# Patient Record
Sex: Male | Born: 1990
Health system: Southern US, Community
[De-identification: ages and names within clinical notes are randomized; demographics above are authoritative.]

## PROBLEM LIST (undated history)

## (undated) DIAGNOSIS — T7840XA Allergy, unspecified, initial encounter: Secondary | ICD-10-CM

## (undated) DIAGNOSIS — K219 Gastro-esophageal reflux disease without esophagitis: Secondary | ICD-10-CM

## (undated) DIAGNOSIS — M0820 Juvenile rheumatoid arthritis with systemic onset, unspecified site: Secondary | ICD-10-CM

## (undated) DIAGNOSIS — E785 Hyperlipidemia, unspecified: Secondary | ICD-10-CM

## (undated) HISTORY — DX: Hyperlipidemia, unspecified: E78.5

## (undated) HISTORY — DX: Allergy, unspecified, initial encounter: T78.40XA

## (undated) HISTORY — DX: Gastro-esophageal reflux disease without esophagitis: K21.9

---

## 2015-05-15 ENCOUNTER — Emergency Department (HOSPITAL_COMMUNITY): Payer: No Typology Code available for payment source

## 2015-05-15 ENCOUNTER — Encounter (HOSPITAL_COMMUNITY): Payer: Self-pay | Admitting: Cardiology

## 2015-05-15 ENCOUNTER — Emergency Department (HOSPITAL_COMMUNITY)
Admission: EM | Admit: 2015-05-15 | Discharge: 2015-05-15 | Disposition: A | Discharge status: Home / Self Care | Payer: No Typology Code available for payment source | Attending: Emergency Medicine | Admitting: Emergency Medicine

## 2015-05-15 DIAGNOSIS — S39012A Strain of muscle, fascia and tendon of lower back, initial encounter: Secondary | ICD-10-CM | POA: Diagnosis not present

## 2015-05-15 DIAGNOSIS — Y92410 Unspecified street and highway as the place of occurrence of the external cause: Secondary | ICD-10-CM | POA: Diagnosis not present

## 2015-05-15 DIAGNOSIS — S161XXA Strain of muscle, fascia and tendon at neck level, initial encounter: Secondary | ICD-10-CM | POA: Diagnosis not present

## 2015-05-15 DIAGNOSIS — Y998 Other external cause status: Secondary | ICD-10-CM | POA: Diagnosis not present

## 2015-05-15 DIAGNOSIS — Y9389 Activity, other specified: Secondary | ICD-10-CM | POA: Diagnosis not present

## 2015-05-15 DIAGNOSIS — S199XXA Unspecified injury of neck, initial encounter: Secondary | ICD-10-CM | POA: Diagnosis present

## 2015-05-15 DIAGNOSIS — Z720 Tobacco use: Secondary | ICD-10-CM | POA: Diagnosis not present
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
## 2015-05-15 MED ORDER — IBUPROFEN 800 MG PO TABS: 800.0000 mg | ORAL_TABLET | Freq: Three times a day (TID) | ORAL | Status: DC | PRN

## 2015-05-15 MED ORDER — HYDROCODONE-ACETAMINOPHEN 5-325 MG PO TABS: 1.0000 | ORAL_TABLET | Freq: Four times a day (QID) | ORAL | Status: DC | PRN
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
## 2015-05-15 NOTE — Discharge Instructions (Signed)
Return here as needed. Follow up with a primary doctor. °

## 2015-05-15 NOTE — ED Notes (Signed)
Pt ambulated in the hallway without any problems.

## 2015-10-10 ENCOUNTER — Encounter: Payer: Self-pay | Admitting: Family Medicine

## 2015-10-10 ENCOUNTER — Ambulatory Visit (INDEPENDENT_AMBULATORY_CARE_PROVIDER_SITE_OTHER): Payer: BLUE CROSS/BLUE SHIELD | Admitting: Family Medicine

## 2015-10-10 VITALS — BP 111/67 | HR 70 | Ht 71.26 in | Wt 241.0 lb

## 2015-10-10 DIAGNOSIS — L74513 Primary focal hyperhidrosis, soles: Secondary | ICD-10-CM | POA: Diagnosis not present

## 2015-10-10 DIAGNOSIS — Z23 Encounter for immunization: Secondary | ICD-10-CM

## 2015-10-10 MED ORDER — ALUMINUM CHLORIDE 20 % EX SOLN: Freq: Every day | CUTANEOUS | Status: DC

## 2015-10-10 MED ORDER — TETANUS-DIPHTH-ACELL PERTUSSIS 5-2.5-18.5 LF-MCG/0.5 IM SUSP
0.5000 mL | Freq: Once | INTRAMUSCULAR | Status: AC
  Administered 2015-10-10: 0.5 mL via INTRAMUSCULAR

## 2015-10-10 NOTE — Addendum Note (Signed)
Addended by: Thom Chimes on: 10/10/2015 01:54 PM   Modules accepted: Orders

## 2015-10-10 NOTE — Progress Notes (Signed)
CC: Raymond Owens is a 25 y.o. male is here for Establish Care and Immunizations   Subjective: HPI:  Very pleasant 25 year old here to establish care  His wife is expecting to deliver their first child the spring, it will be a girl. He wants to know if there is immunizations that he should have prior to the delivery of the child. He believes he is up-to-date on all of his childhood vaccinations but as an adult has not received any vaccinations except for tetanus booster at some point within the last few years. He cannot recall if it had the pertussis vaccine in it or not.   He also has been dealing with sweaty feetFor matter of years. It's occurring on a daily basis. It's worse when he wears boots for work. Symptoms are moderate to severe in severity and result in malodorous socks. His wife has actually restricted him from putting his socks in the general laundry given how bad the smell. He's tried antiperspirants and baby powder but nothing seems to help. He denies itching, redness or pain on either of the feet. He denies excessive sweating elsewhere.  Review of Systems - General ROS: negative for - chills, fever, night sweats, weight gain or weight loss Ophthalmic ROS: negative for - decreased vision Psychological ROS: negative for - anxiety or depression ENT ROS: negative for - hearing change, nasal congestion, tinnitus or allergies Hematological and Lymphatic ROS: negative for - bleeding problems, bruising or swollen lymph nodes Breast ROS: negative Respiratory ROS: no cough, shortness of breath, or wheezing Cardiovascular ROS: no chest pain or dyspnea on exertion Gastrointestinal ROS: no abdominal pain, change in bowel habits, or black or bloody stools Genito-Urinary ROS: negative for - genital discharge, genital ulcers, incontinence or abnormal bleeding from genitals Musculoskeletal ROS: negative for - joint pain or muscle pain Neurological ROS: negative for - headaches or memory  loss Dermatological ROS: negative for lumps, mole changes, rash and skin lesion changes  Past Medical History  Diagnosis Date  . Hyperlipidemia     History reviewed. No pertinent past surgical history. Family History  Problem Relation Age of Onset  . Diabetes Mother   . Heart attack Maternal Uncle   . Cancer Maternal Grandmother   . Cancer Maternal Grandfather   . Cancer Paternal Grandmother   . Cancer Paternal Grandfather     Social History   Social History  . Marital Status: Single    Spouse Name: N/A  . Number of Children: N/A  . Years of Education: N/A   Occupational History  . Not on file.   Social History Main Topics  . Smoking status: Former Smoker    Quit date: 10/10/2011  . Smokeless tobacco: Not on file  . Alcohol Use: Yes  . Drug Use: No  . Sexual Activity: Yes    Birth Control/ Protection: None   Other Topics Concern  . Not on file   Social History Narrative     Objective: BP 111/67 mmHg  Pulse 70  Ht 5' 11.26" (1.81 m)  Wt 241 lb (109.317 kg)  BMI 33.37 kg/m2  General: Alert and Oriented, No Acute Distress HEENT: Pupils equal, round, reactive to light. Conjunctivae clear.  External ears unremarkable, canals clear with intact TMs with appropriate landmarks.  Middle ear appears open without effusion. Pink inferior turbinates.  Moist mucous membranes, pharynx without inflammation nor lesions.  Neck supple without palpable lymphadenopathy nor abnormal masses. Lungs: Clear to auscultation bilaterally, no wheezing/ronchi/rales.  Comfortable work of breathing. Good  air movement. Cardiac: Regular rate and rhythm. Normal S1/S2.  No murmurs, rubs, nor gallops.   Extremities: No peripheral edema.  Strong peripheral pulses.  Mental Status: No depression, anxiety, nor agitation. Skin: Warm and dry.  Assessment & Plan: [name] was seen today for establish care and immunizations.  Diagnoses and all orders for this visit:  Sweaty feet -     aluminum  chloride (DRYSOL) 20 % external solution; Apply topically at bedtime. Apply to feet three consecutive nights then only weekly thereafter.   Discussed that he'll need to get the pertussis vaccine today and that this is packaged in the Tdap vaccine. Additionally be wise to get a flu shot today. I recommended that he also start using Drysol to help with reducing sweat production in both feet.  Return if symptoms worsen or fail to improve.

## 2015-10-26 ENCOUNTER — Encounter: Payer: Self-pay | Admitting: Family Medicine

## 2015-10-26 MED ORDER — OSELTAMIVIR PHOSPHATE 75 MG PO CAPS: 75.0000 mg | ORAL_CAPSULE | Freq: Two times a day (BID) | ORAL | Status: DC

## 2015-10-26 MED ORDER — ONDANSETRON HCL 4 MG PO TABS: 4.0000 mg | ORAL_TABLET | Freq: Three times a day (TID) | ORAL | Status: DC | PRN

## 2016-06-27 ENCOUNTER — Ambulatory Visit (INDEPENDENT_AMBULATORY_CARE_PROVIDER_SITE_OTHER): Payer: BLUE CROSS/BLUE SHIELD | Admitting: Physician Assistant

## 2016-06-27 ENCOUNTER — Encounter: Payer: Self-pay | Admitting: Physician Assistant

## 2016-06-27 VITALS — BP 131/47 | HR 59 | Ht 71.5 in | Wt 243.0 lb

## 2016-06-27 DIAGNOSIS — B359 Dermatophytosis, unspecified: Secondary | ICD-10-CM

## 2016-06-27 DIAGNOSIS — Z0000 Encounter for general adult medical examination without abnormal findings: Secondary | ICD-10-CM | POA: Diagnosis not present

## 2016-06-27 DIAGNOSIS — Z131 Encounter for screening for diabetes mellitus: Secondary | ICD-10-CM | POA: Diagnosis not present

## 2016-06-27 DIAGNOSIS — Z23 Encounter for immunization: Secondary | ICD-10-CM | POA: Diagnosis not present

## 2016-06-27 DIAGNOSIS — Z13220 Encounter for screening for lipoid disorders: Secondary | ICD-10-CM | POA: Diagnosis not present

## 2016-06-27 LAB — COMPLETE METABOLIC PANEL WITH GFR
ALT: 45 U/L (ref 9–46)
AST: 29 U/L (ref 10–40)
Albumin: 4.5 g/dL (ref 3.6–5.1)
Alkaline Phosphatase: 74 U/L (ref 40–115)
BUN: 11 mg/dL (ref 7–25)
CHLORIDE: 104 mmol/L (ref 98–110)
CO2: 25 mmol/L (ref 20–31)
CREATININE: 0.92 mg/dL (ref 0.60–1.35)
Calcium: 9.5 mg/dL (ref 8.6–10.3)
GLUCOSE: 88 mg/dL (ref 65–99)
Potassium: 4.2 mmol/L (ref 3.5–5.3)
SODIUM: 139 mmol/L (ref 135–146)
TOTAL PROTEIN: 7.6 g/dL (ref 6.1–8.1)
Total Bilirubin: 0.6 mg/dL (ref 0.2–1.2)

## 2016-06-27 LAB — LIPID PANEL
Cholesterol: 152 mg/dL (ref 125–200)
HDL: 28 mg/dL — ABNORMAL LOW (ref >=40)
LDL CALC: 72 mg/dL (ref <130)
TRIGLYCERIDES: 260 mg/dL — AB (ref <150)
Total CHOL/HDL Ratio: 5.4 Ratio — ABNORMAL HIGH (ref <=5.0)
VLDL: 52 mg/dL — AB (ref <30)

## 2016-06-27 MED ORDER — CLOTRIMAZOLE 1 % EX CREA: 1.0000 "application " | TOPICAL_CREAM | Freq: Two times a day (BID) | CUTANEOUS | 1 refills | Status: DC

## 2016-06-27 MED ORDER — MELOXICAM 15 MG PO TABS: 15.0000 mg | ORAL_TABLET | Freq: Every day | ORAL | 1 refills | Status: DC

## 2016-06-27 NOTE — Patient Instructions (Addendum)
Body Ringworm Ringworm (tinea corporis) is a fungal infection of the skin on the body. This infection is not caused by worms, but is actually caused by a fungus. Fungus normally lives on the top of your skin and can be useful. However, in the case of ringworms, the fungus grows out of control and causes a skin infection. It can involve any area of skin on the body and can spread easily from one person to another (contagious). Ringworm is a common problem for children, but it can affect adults as well. Ringworm is also often found in athletes, especially wrestlers who share equipment and mats.  CAUSES  Ringworm of the body is caused by a fungus called dermatophyte. It can spread by:  Touchingother people who are infected.  Touchinginfected pets.  Touching or sharingobjects that have been in contact with the infected person or pet (hats, combs, towels, clothing, sports equipment). SYMPTOMS   Itchy, raised red spots and bumps on the skin.  Ring-shaped rash.  Redness near the border of the rash with a clear center.  Dry and scaly skin on or around the rash. Not every person develops a ring-shaped rash. Some develop only the red, scaly patches. DIAGNOSIS  Most often, ringworm can be diagnosed by performing a skin exam. Your caregiver may choose to take a skin scraping from the affected area. The sample will be examined under the microscope to see if the fungus is present.  TREATMENT  Body ringworm may be treated with a topical antifungal cream or ointment. Sometimes, an antifungal shampoo that can be used on your body is prescribed. You may be prescribed antifungal medicines to take by mouth if your ringworm is severe, keeps coming back, or lasts a long time.  HOME CARE INSTRUCTIONS   Only take over-the-counter or prescription medicines as directed by your caregiver.  Wash the infected area and dry it completely before applying yourcream or ointment.  When using antifungal shampoo to  treat the ringworm, leave the shampoo on the body for 3-5 minutes before rinsing.   Wear loose clothing to stop clothes from rubbing and irritating the rash.  Wash or change your bed sheets every night while you have the rash.  Have your pet treated by your veterinarian if it has the same infection. To prevent ringworm:   Practice good hygiene.  Wear sandals or shoes in public places and showers.  Do not share personal items with others.  Avoid touching red patches of skin on other people.  Avoid touching pets that have bald spots or wash your hands after doing so. SEEK MEDICAL CARE IF:   Your rash continues to spread after 7 days of treatment.  Your rash is not gone in 4 weeks.  The area around your rash becomes red, warm, tender, and swollen.   This information is not intended to replace advice given to you by your health care provider. Make sure you discuss any questions you have with your health care provider.   Document Released: 08/08/2000 Document Revised: 05/05/2012 Document Reviewed: 02/23/2012 Elsevier Interactive Patient Education 2016 [employer] Keeping you healthy  Get these tests  Blood pressure- Have your blood pressure checked once a year by your healthcare provider.  Normal blood pressure is 120/80.  Weight- Have your body mass index (BMI) calculated to screen for obesity.  BMI is a measure of body fat based on height and weight. You can also calculate your own BMI at https://www.west-esparza.com/www.nhlbisupport.com/bmi/.  Cholesterol- Have your cholesterol checked regularly starting  at age 24, sooner may be necessary if you have diabetes, high blood pressure, if a family member developed heart diseases at an early age or if you smoke.   Chlamydia, HIV, and other sexual transmitted disease- Get screened each year until the age of 24 then within three months of each new sexual partner.  Diabetes- Have your blood sugar checked regularly if you have high blood pressure, high  cholesterol, a family history of diabetes or if you are overweight.  Get these vaccines  Flu shot- Every fall.  Tetanus shot- Every 10 years.  Menactra- Single dose; prevents meningitis.  Take these steps  Don't smoke- If you do smoke, ask your healthcare provider about quitting. For tips on how to quit, go to www.smokefree.gov or call 1-800-QUIT-NOW.  Be physically active- Exercise 5 days a week for at least 30 minutes.  If you are not already physically active start slow and gradually work up to 30 minutes of moderate physical activity.  Examples of moderate activity include walking briskly, mowing the yard, dancing, swimming bicycling, etc.  Eat a healthy diet- Eat a variety of healthy foods such as fruits, vegetables, low fat milk, low fat cheese, yogurt, lean meats, poultry, fish, beans, tofu, etc.  For more information on healthy eating, go to www.thenutritionsource.org  Drink alcohol in moderation- Limit alcohol intake two drinks or less a day.  Never drink and drive.  Dentist- Brush and floss teeth twice daily; visit your dentis twice a year.  Depression-Your emotional health is as important as your physical health.  If you're feeling down, losing interest in things you normally enjoy please talk with your healthcare provider.  Gun Safety- If you keep a gun in your home, keep it unloaded and with the safety lock on.  Bullets should be stored separately.  Helmet use- Always wear a helmet when riding a motorcycle, bicycle, rollerblading or skateboarding.  Safe sex- If you may be exposed to a sexually transmitted infection, use a condom  Seat belts- Seat bels can save your life; always wear one.  Smoke/Carbon Monoxide detectors- These detectors need to be installed on the appropriate level of your home.  Replace batteries at least once a year.  Skin Cancer- When out in the sun, cover up and use sunscreen SPF 15 or higher.  Violence- If anyone is threatening or hurting you,  please tell your healthcare provider.

## 2016-06-29 DIAGNOSIS — B359 Dermatophytosis, unspecified: Secondary | ICD-10-CM | POA: Insufficient documentation

## 2016-06-29 NOTE — Progress Notes (Signed)
Subjective:    Patient ID: Raymond JacobsenSteven Owens, male    DOB: May 23, 1991, 25 y.o.   MRN: [id number]  HPI Pt is a 25 yo male who presents to the clinic for CPE. He does have a itchy rash on right elbow. Noticed for a few weeks. Not tried anything to make better.   .. Active Ambulatory Problems    Diagnosis Date Noted  . Ringworm 06/29/2016   Resolved Ambulatory Problems    Diagnosis Date Noted  . No Resolved Ambulatory Problems   Past Medical History:  Diagnosis Date  . Hyperlipidemia    . Family History  Problem Relation Age of Onset  . Diabetes Mother   . Heart attack Maternal Uncle   . Cancer Maternal Grandmother   . Cancer Maternal Grandfather   . Cancer Paternal Grandmother   . Cancer Paternal Grandfather    .[name]. Social History   Social History  . Marital status: Single    Spouse name: N/A  . Number of children: N/A  . Years of education: N/A   Occupational History  . Not on file.   Social History Main Topics  . Smoking status: Former Smoker    Quit date: 10/10/2011  . Smokeless tobacco: Not on file  . Alcohol use Yes  . Drug use: No  . Sexual activity: Yes    Birth control/ protection: None   Other Topics Concern  . Not on file   Social History Narrative  . No narrative on file      Review of Systems  All other systems reviewed and are negative.      Objective:   Physical Exam BP (!) 131/47   Pulse (!) 59   Ht 5' 11.5" (1.816 m)   Wt 243 lb (110.2 kg)   BMI 33.42 kg/m   General Appearance:    Alert, cooperative, no distress, appears stated age  Head:    Normocephalic, without obvious abnormality, atraumatic  Eyes:    PERRL, conjunctiva/corneas clear, EOM's intact, fundi    benign, both eyes       Ears:    Normal TM's and external ear canals, both ears  Nose:   Nares normal, septum midline, mucosa normal, no drainage    or sinus tenderness  Throat:   Lips, mucosa, and tongue normal; teeth and gums normal  Neck:   Supple, symmetrical, trachea  midline, no adenopathy;       thyroid:  No enlargement/tenderness/nodules; no carotid   bruit or JVD  Back:     Symmetric, no curvature, ROM normal, no CVA tenderness  Lungs:     Clear to auscultation bilaterally, respirations unlabored  Chest wall:    No tenderness or deformity  Heart:    Regular rate and rhythm, S1 and S2 normal, no murmur, rub   or gallop  Abdomen:     Soft, non-tender, bowel sounds active all four quadrants,    no masses, no organomegaly        Extremities:   Extremities normal, atraumatic, no cyanosis or edema  Pulses:   2+ and symmetric all extremities  Skin:   Circular rash with central clearing and raised erythematous borders on right elbow. Skin color, texture, turgor normal, no lesions  Lymph nodes:   Cervical, supraclavicular, and axillary nodes normal  Neurologic:   CNII-XII intact. Normal strength, sensation and reflexes      throughout         Assessment & Plan:  [name].[name].Viviann SpareSteven was seen today for annual  exam.  Diagnoses and all orders for this visit:  Routine physical examination  Influenza vaccine needed -     Flu Vaccine QUAD 36+ mos PF IM (Fluarix & Fluzone Quad PF)  Screening for lipid disorders -     Lipid panel  Screening for diabetes mellitus -     COMPLETE METABOLIC PANEL WITH GFR  Ringworm -     clotrimazole (LOTRIMIN) 1 % cream; Apply 1 application topically 2 (two) times daily.  Other orders -     meloxicam (MOBIC) 15 MG tablet; Take 1 tablet (15 mg total) by mouth daily.

## 2016-06-30 ENCOUNTER — Encounter: Payer: Self-pay | Admitting: Physician Assistant

## 2016-06-30 DIAGNOSIS — E781 Pure hyperglyceridemia: Secondary | ICD-10-CM | POA: Insufficient documentation

## 2016-08-27 DIAGNOSIS — H52223 Regular astigmatism, bilateral: Secondary | ICD-10-CM | POA: Diagnosis not present

## 2017-05-19 ENCOUNTER — Encounter: Payer: Self-pay | Admitting: Physician Assistant

## 2017-05-19 ENCOUNTER — Ambulatory Visit (INDEPENDENT_AMBULATORY_CARE_PROVIDER_SITE_OTHER): Payer: 59 | Admitting: Physician Assistant

## 2017-05-19 VITALS — BP 136/59 | HR 63 | Ht 71.5 in | Wt 225.0 lb

## 2017-05-19 DIAGNOSIS — E781 Pure hyperglyceridemia: Secondary | ICD-10-CM | POA: Diagnosis not present

## 2017-05-19 DIAGNOSIS — Z13220 Encounter for screening for lipoid disorders: Secondary | ICD-10-CM | POA: Diagnosis not present

## 2017-05-19 DIAGNOSIS — Z0000 Encounter for general adult medical examination without abnormal findings: Secondary | ICD-10-CM | POA: Diagnosis not present

## 2017-05-19 DIAGNOSIS — Z131 Encounter for screening for diabetes mellitus: Secondary | ICD-10-CM | POA: Diagnosis not present

## 2017-05-19 DIAGNOSIS — Z23 Encounter for immunization: Secondary | ICD-10-CM | POA: Diagnosis not present

## 2017-05-19 LAB — LIPID PANEL W/REFLEX DIRECT LDL
CHOL/HDL RATIO: 4.6 (calc) (ref <5.0)
CHOLESTEROL: 155 mg/dL (ref <200)
HDL: 34 mg/dL — AB (ref >40)
LDL CHOLESTEROL (CALC): 99 mg/dL
Non-HDL Cholesterol (Calc): 121 mg/dL (calc) (ref <130)
TRIGLYCERIDES: 127 mg/dL (ref <150)

## 2017-05-19 LAB — COMPLETE METABOLIC PANEL WITH GFR
AG RATIO: 1.8 (calc) (ref 1.0–2.5)
ALBUMIN MSPROF: 4.6 g/dL (ref 3.6–5.1)
ALT: 34 U/L (ref 9–46)
AST: 22 U/L (ref 10–40)
Alkaline phosphatase (APISO): 65 U/L (ref 40–115)
BILIRUBIN TOTAL: 0.7 mg/dL (ref 0.2–1.2)
BUN: 14 mg/dL (ref 7–25)
CALCIUM: 9.6 mg/dL (ref 8.6–10.3)
CHLORIDE: 102 mmol/L (ref 98–110)
CO2: 26 mmol/L (ref 20–32)
Creat: 0.93 mg/dL (ref 0.60–1.35)
GFR, EST NON AFRICAN AMERICAN: 114 mL/min/{1.73_m2} (ref >=60)
GFR, Est African American: 132 mL/min/{1.73_m2} (ref >=60)
Globulin: 2.6 g/dL (calc) (ref 1.9–3.7)
Glucose, Bld: 91 mg/dL (ref 65–99)
POTASSIUM: 3.8 mmol/L (ref 3.5–5.3)
Sodium: 139 mmol/L (ref 135–146)
Total Protein: 7.2 g/dL (ref 6.1–8.1)

## 2017-05-19 NOTE — Patient Instructions (Signed)

## 2017-05-19 NOTE — Progress Notes (Signed)
Subjective:    Patient ID: Raymond Owens, male    DOB: 06/14/1991, 26 y.o.   MRN: [id number]  HPI  Pt is a 26 yo male who presents today for CPE.   He is running for at least twice a week. He is making better food options and down from 243 to 225.   .. Active Ambulatory Problems    Diagnosis Date Noted  . Ringworm 06/29/2016  . Hypertriglyceridemia 06/30/2016   Resolved Ambulatory Problems    Diagnosis Date Noted  . No Resolved Ambulatory Problems   Past Medical History:  Diagnosis Date  . Hyperlipidemia    .[name] Family History  Problem Relation Age of Onset  . Diabetes Mother   . Heart attack Maternal Uncle   . Cancer Maternal Grandmother   . Cancer Maternal Grandfather   . Cancer Paternal Grandmother   . Cancer Paternal Grandfather    .[name] Social History   Social History  . Marital status: Single    Spouse name: N/A  . Number of children: N/A  . Years of education: N/A   Occupational History  . Not on file.   Social History Main Topics  . Smoking status: Former Smoker    Quit date: 10/10/2011  . Smokeless tobacco: Never Used  . Alcohol use Yes  . Drug use: No  . Sexual activity: Yes    Birth control/ protection: None   Other Topics Concern  . Not on file   Social History Narrative  . No narrative on file      Review of Systems  All other systems reviewed and are negative.      Objective:   Physical Exam BP (!) 136/59   Pulse 63   Ht 5' 11.5" (1.816 m)   Wt 225 lb (102.1 kg)   BMI 30.94 kg/m   General Appearance:    Alert, cooperative, no distress, appears stated age  Head:    Normocephalic, without obvious abnormality, atraumatic  Eyes:    PERRL, conjunctiva/corneas clear, EOM's intact, fundi    benign, both eyes       Ears:    Normal TM's and external ear canals, both ears  Nose:   Nares normal, septum midline, mucosa normal, no drainage    or sinus tenderness  Throat:   Lips, mucosa, and tongue normal; teeth and gums normal   Neck:   Supple, symmetrical, trachea midline, no adenopathy;       thyroid:  No enlargement/tenderness/nodules; no carotid   bruit or JVD  Back:     Symmetric, no curvature, ROM normal, no CVA tenderness  Lungs:     Clear to auscultation bilaterally, respirations unlabored  Chest wall:    No tenderness or deformity  Heart:    Regular rate and rhythm, S1 and S2 normal, no murmur, rub   or gallop  Abdomen:     Soft, non-tender, bowel sounds active all four quadrants,    no masses, no organomegaly  Genitalia:  NOt done.   Rectal:  Not done.   Extremities:   Extremities normal, atraumatic, no cyanosis or edema  Pulses:   2+ and symmetric all extremities  Skin:   Skin color, texture, turgor normal, no rashes or lesions  Lymph nodes:   Cervical, supraclavicular, and axillary nodes normal  Neurologic:   CNII-XII intact. Normal strength, sensation and reflexes      throughout         Assessment & Plan:   [name] KitchenMarland KitchenGregorio was seen today  for annual exam.  Diagnoses and all orders for this visit:  Routine physical examination -     Lipid Panel w/reflex Direct LDL -     COMPLETE METABOLIC PANEL WITH GFR  Screening for lipid disorders -     Lipid Panel w/reflex Direct LDL  Screening for diabetes mellitus -     COMPLETE METABOLIC PANEL WITH GFR  Influenza vaccine needed -     Flu Vaccine QUAD 6+ mos PF IM (Fluarix Quad PF)  Hypertriglyceridemia    .[name] Depression screen [facility] 2/9 05/19/2017  Decreased Interest 0  Down, Depressed, Hopeless 0  PHQ - 2 Score 0   Labs ordered today.   [name].Start a regular exercise program and make sure you are eating a healthy diet Try to eat 4 servings of dairy a day or take a calcium supplement (  twice a day). Your vaccines are up to date.   Continue to work on weight loss with exercise and diet control.   Flu shot given today.

## 2017-12-22 MED FILL — CHLORHEXIDINE 0.12% RINSE: 0.12 | 16 days supply | Qty: 473 | Fill #0

## 2018-05-20 ENCOUNTER — Ambulatory Visit (INDEPENDENT_AMBULATORY_CARE_PROVIDER_SITE_OTHER): Payer: 59

## 2018-05-20 ENCOUNTER — Ambulatory Visit (INDEPENDENT_AMBULATORY_CARE_PROVIDER_SITE_OTHER): Payer: 59 | Admitting: Physician Assistant

## 2018-05-20 ENCOUNTER — Encounter: Payer: Self-pay | Admitting: Physician Assistant

## 2018-05-20 VITALS — BP 138/80 | HR 80 | Ht 71.5 in | Wt 228.0 lb

## 2018-05-20 DIAGNOSIS — Z0000 Encounter for general adult medical examination without abnormal findings: Secondary | ICD-10-CM

## 2018-05-20 DIAGNOSIS — G8929 Other chronic pain: Secondary | ICD-10-CM | POA: Diagnosis not present

## 2018-05-20 DIAGNOSIS — M79672 Pain in left foot: Secondary | ICD-10-CM | POA: Diagnosis not present

## 2018-05-20 DIAGNOSIS — Z23 Encounter for immunization: Secondary | ICD-10-CM | POA: Diagnosis not present

## 2018-05-20 IMAGING — DX DG FOOT COMPLETE 3+V*L*
3 series · 3 of 3 positions shown · non-contrast
Comparison: None.

CLINICAL DATA: Chronic left heel pain for the past year, worse with
increased running.

EXAM:
LEFT FOOT - COMPLETE 3+ VIEW

[foot ap]
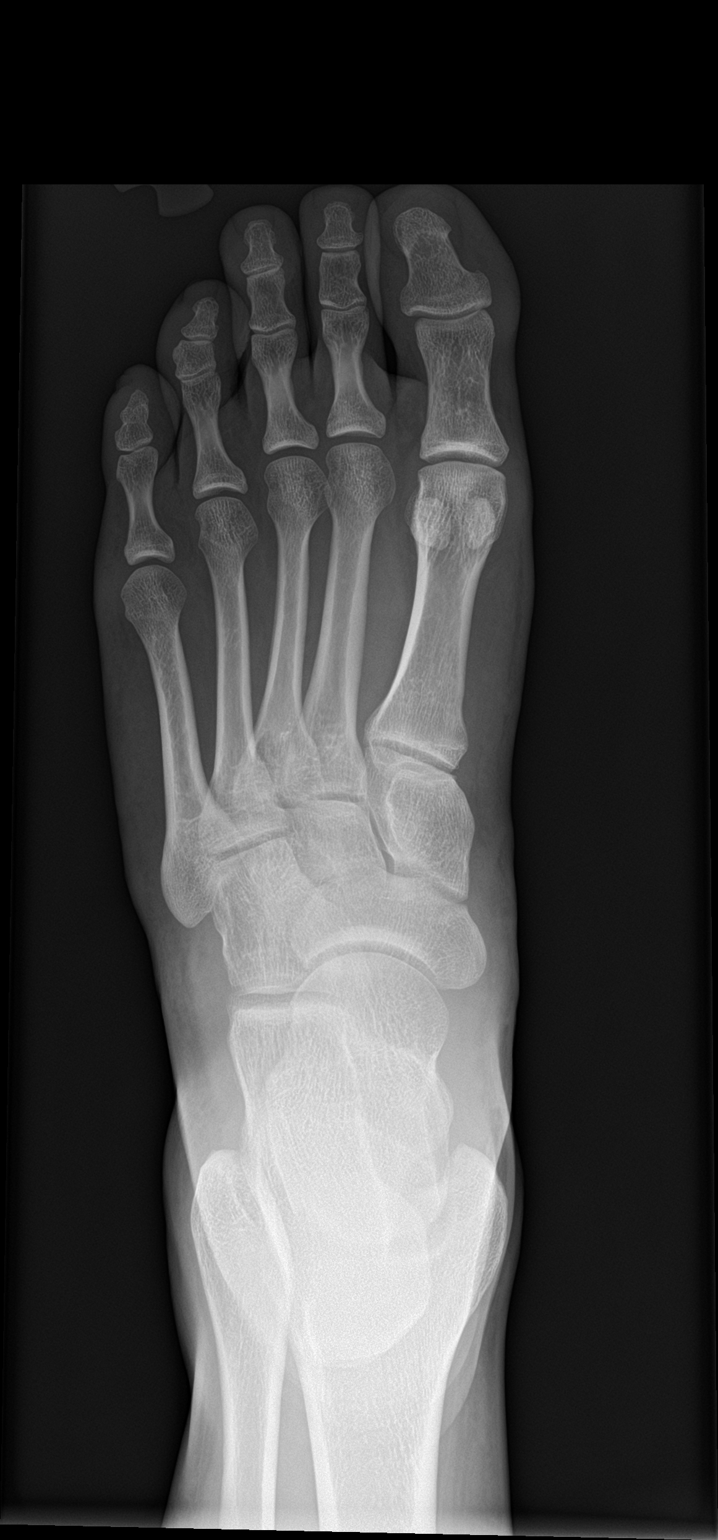

[foot obl]
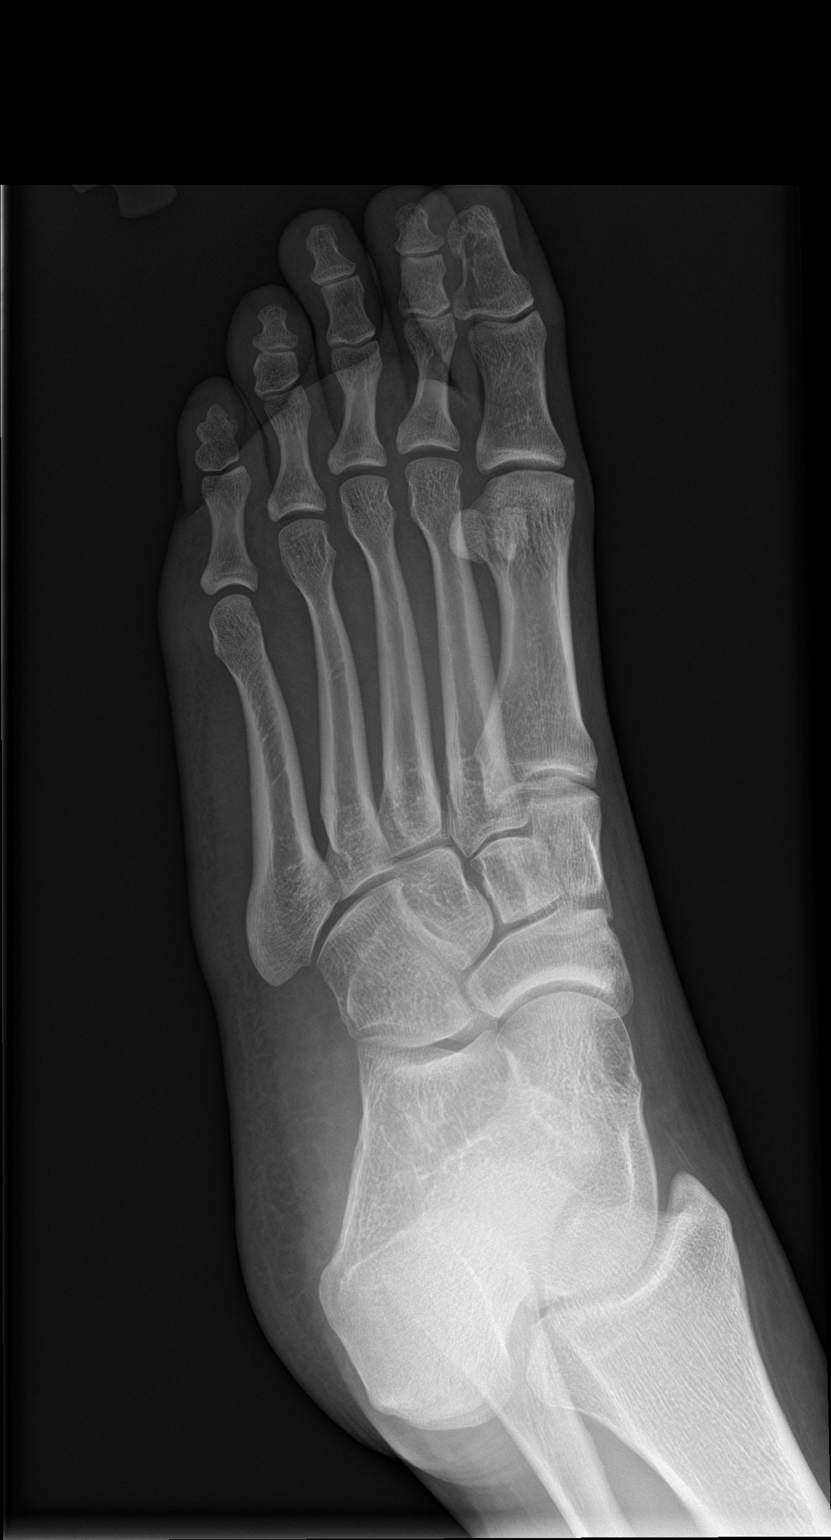

[foot lat]
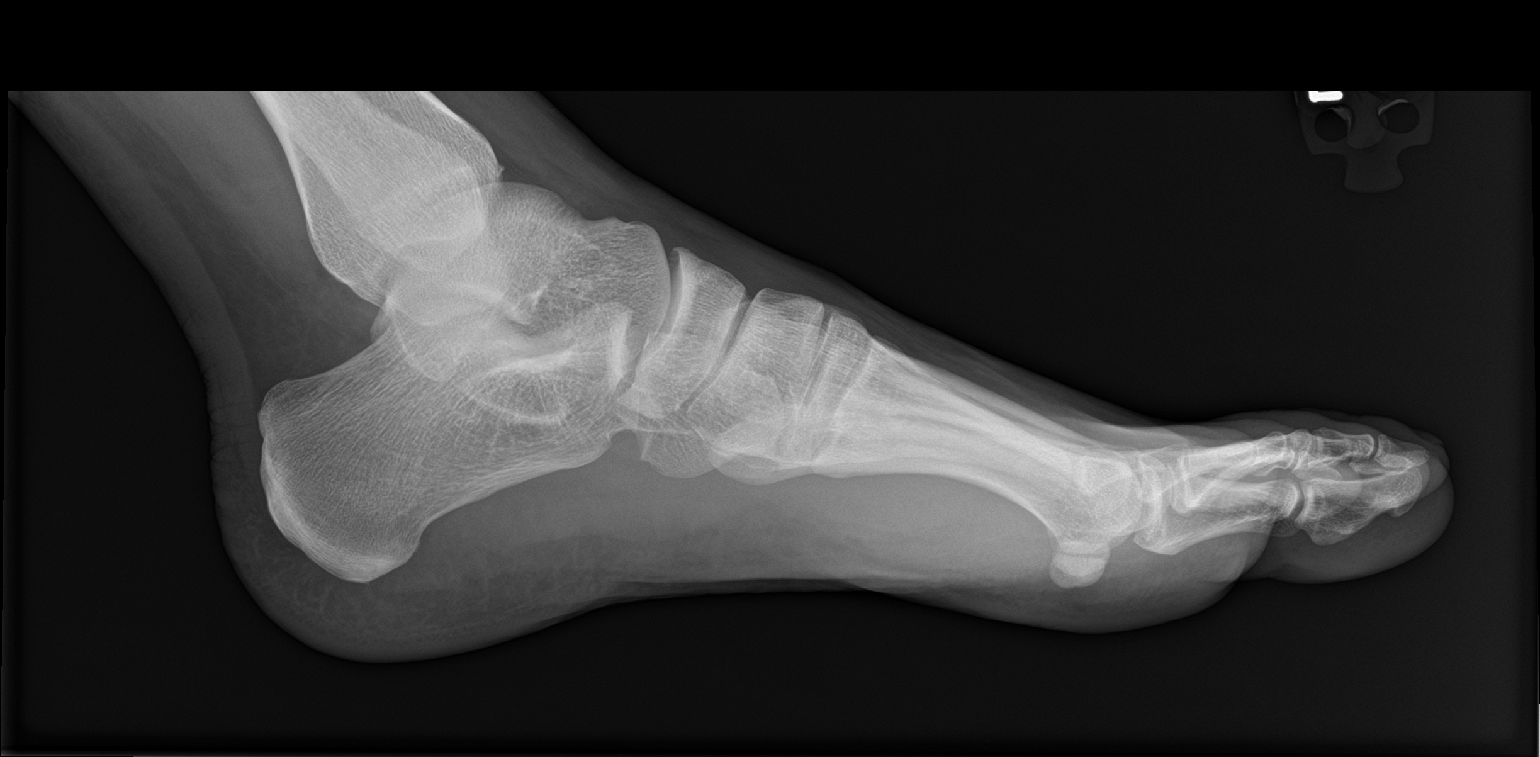

[3 of 3 positions shown; findings below may reference images not displayed]

FINDINGS: There is no evidence of fracture or dislocation. There is no
evidence of arthropathy or other focal bone abnormality. Soft
tissues are unremarkable.
IMPRESSION: Normal examination.

## 2018-05-20 MED ORDER — MELOXICAM 15 MG PO TABS: 15.0000 mg | ORAL_TABLET | Freq: Every day | ORAL | 1 refills | Status: DC

## 2018-05-20 NOTE — Patient Instructions (Addendum)
Consider podiatry referral, sports medicine for orthotics/injections.  Will give mobic as needed.   Health Maintenance, Male A healthy lifestyle and preventive care is important for your health and wellness. Ask your health care provider about what schedule of regular examinations is right for you. What should I know about weight and diet? Eat a Healthy Diet  Eat plenty of vegetables, fruits, whole grains, low-fat dairy products, and lean protein.  Do not eat a lot of foods high in solid fats, added sugars, or salt.  Maintain a Healthy Weight Regular exercise can help you achieve or maintain a healthy weight. You should:  Do at least 150 minutes of exercise each week. The exercise should increase your heart rate and make you sweat (moderate-intensity exercise).  Do strength-training exercises at least twice a week.  Watch Your Levels of Cholesterol and Blood Lipids  Have your blood tested for lipids and cholesterol every 5 years starting at 26 years of age. If you are at high risk for heart disease, you should start having your blood tested when you are 26 years old. You may need to have your cholesterol levels checked more often if: ? Your lipid or cholesterol levels are high. ? You are older than 26 years of age. ? You are at high risk for heart disease.  What should I know about cancer screening? Many types of cancers can be detected early and may often be prevented. Lung Cancer  You should be screened every year for lung cancer if: ? You are a current smoker who has smoked for at least 30 years. ? You are a former smoker who has quit within the past 15 years.  Talk to your health care provider about your screening options, when you should start screening, and how often you should be screened.  Colorectal Cancer  Routine colorectal cancer screening usually begins at 27 years of age and should be repeated every 5-10 years until you are 26 years old. You may need to be screened  more often if early forms of precancerous polyps or small growths are found. Your health care provider may recommend screening at an earlier age if you have risk factors for colon cancer.  Your health care provider may recommend using home test kits to check for hidden blood in the stool.  A small camera at the end of a tube can be used to examine your colon (sigmoidoscopy or colonoscopy). This checks for the earliest forms of colorectal cancer.  Prostate and Testicular Cancer  Depending on your age and overall health, your health care provider may do certain tests to screen for prostate and testicular cancer.  Talk to your health care provider about any symptoms or concerns you have about testicular or prostate cancer.  Skin Cancer  Check your skin from head to toe regularly.  Tell your health care provider about any new moles or changes in moles, especially if: ? There is a change in a mole's size, shape, or color. ? You have a mole that is larger than a pencil eraser.  Always use sunscreen. Apply sunscreen liberally and repeat throughout the day.  Protect yourself by wearing long sleeves, pants, a wide-brimmed hat, and sunglasses when outside.  What should I know about heart disease, diabetes, and high blood pressure?  If you are 58-95 years of age, have your blood pressure checked every 3-5 years. If you are 68 years of age or older, have your blood pressure checked every year. You should have your  blood pressure measured twice-once when you are at a hospital or clinic, and once when you are not at a hospital or clinic. Record the average of the two measurements. To check your blood pressure when you are not at a hospital or clinic, you can use: ? An automated blood pressure machine at a pharmacy. ? A home blood pressure monitor.  Talk to your health care provider about your target blood pressure.  If you are between 80-67 years old, ask your health care provider if you should  take aspirin to prevent heart disease.  Have regular diabetes screenings by checking your fasting blood sugar level. ? If you are at a normal weight and have a low risk for diabetes, have this test once every three years after the age of 35. ? If you are overweight and have a high risk for diabetes, consider being tested at a younger age or more often.  A one-time screening for abdominal aortic aneurysm (AAA) by ultrasound is recommended for men aged 65-75 years who are current or former smokers. What should I know about preventing infection? Hepatitis B If you have a higher risk for hepatitis B, you should be screened for this virus. Talk with your health care provider to find out if you are at risk for hepatitis B infection. Hepatitis C Blood testing is recommended for:  Everyone born from [birth year] through 1964.  Anyone with known risk factors for hepatitis C.  Sexually Transmitted Diseases (STDs)  You should be screened each year for STDs including gonorrhea and chlamydia if: ? You are sexually active and are younger than 26 years of age. ? You are older than 26 years of age and your health care provider tells you that you are at risk for this type of infection. ? Your sexual activity has changed since you were last screened and you are at an increased risk for chlamydia or gonorrhea. Ask your health care provider if you are at risk.  Talk with your health care provider about whether you are at high risk of being infected with HIV. Your health care provider may recommend a prescription medicine to help prevent HIV infection.  What else can I do?  Schedule regular health, dental, and eye exams.  Stay current with your vaccines (immunizations).  Do not use any tobacco products, such as cigarettes, chewing tobacco, and e-cigarettes. If you need help quitting, ask your health care provider.  Limit alcohol intake to no more than 2 drinks per day. One drink equals 12 ounces of beer, 5  ounces of wine, or 1 ounces of hard liquor.  Do not use street drugs.  Do not share needles.  Ask your health care provider for help if you need support or information about quitting drugs.  Tell your health care provider if you often feel depressed.  Tell your health care provider if you have ever been abused or do not feel safe at home. This information is not intended to replace advice given to you by your health care provider. Make sure you discuss any questions you have with your health care provider. Document Released: 02/07/2008 Document Revised: 04/09/2016 Document Reviewed: 05/15/2015 Elsevier Interactive Patient Education  2018 [employer]  Heel Spur A heel spur is a bony growth that forms on the bottom of your heel bone (calcaneus). Heel spurs are common and do not always cause pain. However, heel spurs often cause inflammation in the strong band of tissue that runs underneath the bone of your foot (  plantar fascia). When this happens, you may feel pain on the bottom of your foot, near your heel. What are the causes? The cause of heel spurs is not completely understood. They may be caused by pressure on the heel. Or, they may stem from the muscle attachments (tendons) near the spur pulling on the heel. What increases the risk? You may be at risk for a heel spur if you:  Are older than 40.  Are overweight.  Have wear and tear arthritis (osteoarthritis).  Have plantar fascia inflammation.  What are the signs or symptoms? Some people have heel spurs but no symptoms. If you do have symptoms, they may include:  Pain in the bottom of your heel.  Pain that is worse when you first get out of bed.  Pain that gets worse after walking or standing.  How is this diagnosed? Your health care provider may diagnose a heel spur based on your symptoms and a physical exam. You may also have an X-ray of your foot to check for a bony growth coming from the calcaneus. How is this  treated? Treatment aims to relieve the pain from the heel spur. This may include:  Stretching exercises.  Losing weight.  Wearing specific shoes, inserts, or orthotics for comfort and support.  Wearing splints at night to properly position your feet.  Taking over-the-counter medicine to relieve pain.  Being treated with high-intensity sound waves to break up the heel spur (extracorporeal shock wave therapy).  Getting steroid injections in your heel to reduce swelling and ease pain.  Having surgery if your heel spur causes long-term (chronic) pain.  Follow these instructions at home:  Take medicines only as directed by your health care provider.  Ask your health care provider if you should use ice or cold packs on the painful areas of your heel or foot.  Avoid activities that cause you pain until you recover or as directed by your health care provider.  Stretch before exercising or being physically active.  Wear supportive shoes that fit well as directed by your health care provider. You might need to buy new shoes. Wearing old shoes or shoes that do not fit correctly may not provide the support that you need.  Lose weight if your health care provider thinks you should. This can relieve pressure on your foot that may be causing pain and discomfort. Contact a health care provider if:  Your pain continues or gets worse. This information is not intended to replace advice given to you by your health care provider. Make sure you discuss any questions you have with your health care provider. Document Released: 09/17/2005 Document Revised: 01/17/2016 Document Reviewed: 10/12/2013 Elsevier Interactive Patient Education  [employer].

## 2018-05-20 NOTE — Progress Notes (Signed)
Subjective:    Patient ID: Raymond Owens, male    DOB: 08-23-1991, 27 y.o.   MRN: [id number]  HPI Pt is a 27 yo male who presents to the clinic for CPE.   He is doing pretty good. He only has some left heel pain that has been bothering him for about 1 year. Only hurts when been on it all day. 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 It does not hurt first thing in am but worse in pm. He has taken ibuprofen which help some. He bought better shoes without much benefit. He has used ice but pain still comes back. Denies any injury. This left foot pain has caused him to stop running which is bothersome for him.   .. Active Ambulatory Problems    Diagnosis Date Noted  . Ringworm 06/29/2016  . Hypertriglyceridemia 06/30/2016   Resolved Ambulatory Problems    Diagnosis Date Noted  . No Resolved Ambulatory Problems   Past Medical History:  Diagnosis Date  . Hyperlipidemia    .[name] Family History  Problem Relation Age of Onset  . Diabetes Mother   . Heart attack Maternal Uncle   . Cancer Maternal Grandmother   . Cancer Maternal Grandfather   . Cancer Paternal Grandmother   . Cancer Paternal Grandfather    .[name] Social History   Socioeconomic History  . Marital status: Married    Spouse name: Not on file  . Number of children: Not on file  . Years of education: Not on file  . Highest education level: Not on file  Occupational History  . Not on file  Social Needs  . Financial resource strain: Not on file  . Food insecurity:    Worry: Not on file    Inability: Not on file  . Transportation needs:    Medical: Not on file    Non-medical: Not on file  Tobacco Use  . Smoking status: Former Smoker    Last attempt to quit: 10/10/2011    Years since quitting: 6.6  . Smokeless tobacco: Never Used  Substance and Sexual Activity  . Alcohol use: Yes  . Drug use: No  . Sexual activity: Yes    Birth control/protection: None  Lifestyle  . Physical activity:    Days per week: Not on  file    Minutes per session: Not on file  . Stress: Not on file  Relationships  . Social connections:    Talks on phone: Not on file    Gets together: Not on file    Attends religious service: Not on file    Active member of club or organization: Not on file    Attends meetings of clubs or organizations: Not on file    Relationship status: Not on file  . Intimate partner violence:    Fear of current or ex partner: Not on file    Emotionally abused: Not on file    Physically abused: Not on file    Forced sexual activity: Not on file  Other Topics Concern  . Not on file  Social History Narrative  . Not on file     Review of Systems  All other systems reviewed and are negative.      Objective:   Physical Exam BP 138/80   Pulse 80   Ht 5' 11.5" (1.816 m)   Wt 228 lb (103.4 kg)   BMI 31.36 kg/m   General Appearance:    Alert, cooperative, no  distress, appears stated age  Head:    Normocephalic, without obvious abnormality, atraumatic  Eyes:    PERRL, conjunctiva/corneas clear, EOM's intact, fundi    benign, both eyes       Ears:    Normal TM's and external ear canals, both ears  Nose:   Nares normal, septum midline, mucosa normal, no drainage    or sinus tenderness  Throat:   Lips, mucosa, and tongue normal; teeth and gums normal  Neck:   Supple, symmetrical, trachea midline, no adenopathy;       thyroid:  No enlargement/tenderness/nodules; no carotid   bruit or JVD  Back:     Symmetric, no curvature, ROM normal, no CVA tenderness  Lungs:     Clear to auscultation bilaterally, respirations unlabored  Chest wall:    No tenderness or deformity  Heart:    Regular rate and rhythm, S1 and S2 normal, no murmur, rub   or gallop  Abdomen:     Soft, non-tender, bowel sounds active all four quadrants,    no masses, no organomegaly        Extremities:   Extremities normal, atraumatic, no cyanosis or edema. Pain over left heel to palpation. NROM. Pain with dorsiflexion of left  foot. No pain over fascia.   Pulses:   2+ and symmetric all extremities  Skin:   Skin color, texture, turgor normal, no rashes or lesions  Lymph nodes:   Cervical, supraclavicular, and axillary nodes normal  Neurologic:   CNII-XII intact. Normal strength, sensation and reflexes      throughout         Assessment & Plan:  [name] KitchenMarland KitchenDiagnoses and all orders for this visit:  Routine physical examination -     Lipid Panel w/reflex Direct LDL -     COMPLETE METABOLIC PANEL WITH GFR -     CBC with Differential/Platelet  Chronic heel pain, left -     DG Foot Complete Left -     meloxicam (MOBIC) 15 MG tablet; Take 1 tablet (15 mg total) by mouth daily.  Need for immunization against influenza -     Flu Vaccine QUAD 36+ mos IM   .[name] Depression screen [facility] 2/9 05/20/2018 05/19/2017  Decreased Interest 0 0  Down, Depressed, Hopeless 0 0  PHQ - 2 Score 0 0  Altered sleeping 0 -  Tired, decreased energy 0 -  Change in appetite 0 -  Feeling bad or failure about yourself  0 -  Trouble concentrating 0 -  Moving slowly or fidgety/restless 0 -  Suicidal thoughts 0 -  PHQ-9 Score 0 -  Difficult doing work/chores Not difficult at all -   .[name] KitchenStart a regular exercise program and make sure you are eating a healthy diet Try to eat 4 servings of dairy a day or take a calcium supplement (500mg  twice a day). Your vaccines are up to date.  Fasting labs ordered today.   Will get xray for foot pain. Suspect heel spur. Given HO. mobic given prn. Discussed follow up with sports medicine for injections/orthotics vs podiatry referral.

## 2018-05-21 ENCOUNTER — Other Ambulatory Visit: Payer: Self-pay

## 2018-05-21 ENCOUNTER — Encounter: Payer: 59 | Admitting: Physician Assistant

## 2018-05-21 DIAGNOSIS — M79672 Pain in left foot: Principal | ICD-10-CM

## 2018-05-21 DIAGNOSIS — G8929 Other chronic pain: Secondary | ICD-10-CM

## 2018-05-21 MED ORDER — MELOXICAM 15 MG PO TABS: 15.0000 mg | ORAL_TABLET | Freq: Every day | ORAL | 1 refills | Status: DC

## 2018-05-21 MED FILL — MELOXICAM 15 MG TABLET: 15 | 30 days supply | Qty: 30 | Fill #0

## 2018-05-24 NOTE — Progress Notes (Signed)
Call pt: completely normal foot. Likely is some fascia inflammation. I do think you should see podiatry or at the very least a sports medicine specialist in the office. I want to see you back running.

## 2018-05-25 DIAGNOSIS — Z0000 Encounter for general adult medical examination without abnormal findings: Secondary | ICD-10-CM | POA: Diagnosis not present

## 2018-05-25 LAB — COMPLETE METABOLIC PANEL WITH GFR
AG RATIO: 1.7 (calc) (ref 1.0–2.5)
ALKALINE PHOSPHATASE (APISO): 69 U/L (ref 40–115)
ALT: 31 U/L (ref 9–46)
AST: 22 U/L (ref 10–40)
Albumin: 4.5 g/dL (ref 3.6–5.1)
BUN: 17 mg/dL (ref 7–25)
CHLORIDE: 104 mmol/L (ref 98–110)
CO2: 26 mmol/L (ref 20–32)
Calcium: 9.6 mg/dL (ref 8.6–10.3)
Creat: 0.89 mg/dL (ref 0.60–1.35)
GFR, EST AFRICAN AMERICAN: 137 mL/min/{1.73_m2} (ref >=60)
GFR, Est Non African American: 118 mL/min/{1.73_m2} (ref >=60)
GLUCOSE: 93 mg/dL (ref 65–99)
Globulin: 2.6 g/dL (calc) (ref 1.9–3.7)
POTASSIUM: 4.9 mmol/L (ref 3.5–5.3)
Sodium: 139 mmol/L (ref 135–146)
TOTAL PROTEIN: 7.1 g/dL (ref 6.1–8.1)
Total Bilirubin: 0.6 mg/dL (ref 0.2–1.2)

## 2018-05-25 LAB — CBC WITH DIFFERENTIAL/PLATELET
BASOS ABS: 43 {cells}/uL (ref 0–200)
Basophils Relative: 0.5 %
EOS PCT: 1.6 %
Eosinophils Absolute: 138 cells/uL (ref 15–500)
HCT: 44.1 % (ref 38.5–50.0)
Hemoglobin: 14.8 g/dL (ref 13.2–17.1)
Lymphs Abs: 2391 cells/uL (ref 850–3900)
MCH: 29.8 pg (ref 27.0–33.0)
MCHC: 33.6 g/dL (ref 32.0–36.0)
MCV: 88.9 fL (ref 80.0–100.0)
MPV: 11.1 fL (ref 7.5–12.5)
Monocytes Relative: 8.3 %
NEUTROS ABS: 5315 {cells}/uL (ref 1500–7800)
NEUTROS PCT: 61.8 %
Platelets: 267 10*3/uL (ref 140–400)
RBC: 4.96 10*6/uL (ref 4.20–5.80)
RDW: 12.7 % (ref 11.0–15.0)
Total Lymphocyte: 27.8 %
WBC mixed population: 714 cells/uL (ref 200–950)
WBC: 8.6 10*3/uL (ref 3.8–10.8)

## 2018-05-25 LAB — LIPID PANEL W/REFLEX DIRECT LDL
Cholesterol: 167 mg/dL (ref <200)
HDL: 31 mg/dL — ABNORMAL LOW (ref >40)
LDL CHOLESTEROL (CALC): 101 mg/dL — AB
NON-HDL CHOLESTEROL (CALC): 136 mg/dL — AB (ref <130)
Total CHOL/HDL Ratio: 5.4 (calc) — ABNORMAL HIGH (ref <5.0)
Triglycerides: 233 mg/dL — ABNORMAL HIGH (ref <150)

## 2018-05-26 NOTE — Progress Notes (Signed)
Call pt: cholesterol looks good. Exercise could increase HDL. TG elevated. Need to start fish oil 4000mg  daily and watch sugars and carbs. Kidney liver glucose look good.

## 2018-06-03 DIAGNOSIS — H5213 Myopia, bilateral: Secondary | ICD-10-CM | POA: Diagnosis not present

## 2018-06-03 DIAGNOSIS — Z135 Encounter for screening for eye and ear disorders: Secondary | ICD-10-CM | POA: Diagnosis not present

## 2018-06-03 DIAGNOSIS — H52223 Regular astigmatism, bilateral: Secondary | ICD-10-CM | POA: Diagnosis not present

## 2018-06-22 MED FILL — IBUPROFEN 600 MG TABLET: 600 | 5 days supply | Qty: 20 | Fill #0

## 2018-06-22 MED FILL — HYDROCODON-APAP 5-325: 5-325 | 2 days supply | Qty: 10 | Fill #0

## 2019-03-16 DIAGNOSIS — Z3009 Encounter for other general counseling and advice on contraception: Secondary | ICD-10-CM | POA: Diagnosis not present

## 2019-03-16 MED FILL — DIAZEPAM 10 MG TABS: 10 | 1 days supply | Qty: 1 | Fill #0

## 2019-03-29 MED FILL — DIAZEPAM 10 MG TABS: 10 | 1 days supply | Qty: 1 | Fill #0

## 2019-04-13 DIAGNOSIS — Z302 Encounter for sterilization: Secondary | ICD-10-CM | POA: Diagnosis not present

## 2019-04-20 MED FILL — MELOXICAM 15 MG TABLET: 15 | 15 days supply | Qty: 15 | Fill #0

## 2019-05-25 ENCOUNTER — Other Ambulatory Visit: Payer: Self-pay

## 2019-05-25 ENCOUNTER — Ambulatory Visit (INDEPENDENT_AMBULATORY_CARE_PROVIDER_SITE_OTHER): Payer: 59

## 2019-05-25 ENCOUNTER — Ambulatory Visit (INDEPENDENT_AMBULATORY_CARE_PROVIDER_SITE_OTHER): Payer: 59 | Admitting: Physician Assistant

## 2019-05-25 ENCOUNTER — Encounter: Payer: 59 | Admitting: Physician Assistant

## 2019-05-25 ENCOUNTER — Encounter: Payer: Self-pay | Admitting: Physician Assistant

## 2019-05-25 VITALS — BP 138/59 | HR 72 | Ht 71.0 in | Wt 248.0 lb

## 2019-05-25 DIAGNOSIS — Z13220 Encounter for screening for lipoid disorders: Secondary | ICD-10-CM | POA: Diagnosis not present

## 2019-05-25 DIAGNOSIS — M545 Low back pain: Secondary | ICD-10-CM | POA: Diagnosis not present

## 2019-05-25 DIAGNOSIS — M79672 Pain in left foot: Secondary | ICD-10-CM | POA: Diagnosis not present

## 2019-05-25 DIAGNOSIS — G8929 Other chronic pain: Secondary | ICD-10-CM

## 2019-05-25 DIAGNOSIS — Z23 Encounter for immunization: Secondary | ICD-10-CM | POA: Diagnosis not present

## 2019-05-25 DIAGNOSIS — Z0000 Encounter for general adult medical examination without abnormal findings: Secondary | ICD-10-CM | POA: Diagnosis not present

## 2019-05-25 DIAGNOSIS — Z131 Encounter for screening for diabetes mellitus: Secondary | ICD-10-CM | POA: Diagnosis not present

## 2019-05-25 IMAGING — DX DG LUMBAR SPINE COMPLETE 4+V
5 series · 5 of 5 positions shown · non-contrast
Comparison: [DATE]

CLINICAL DATA: Low back pain, bilateral leg pain for 2 months, no
injury

EXAM:
LUMBAR SPINE - COMPLETE 4+ VIEW

[l-spine ap]
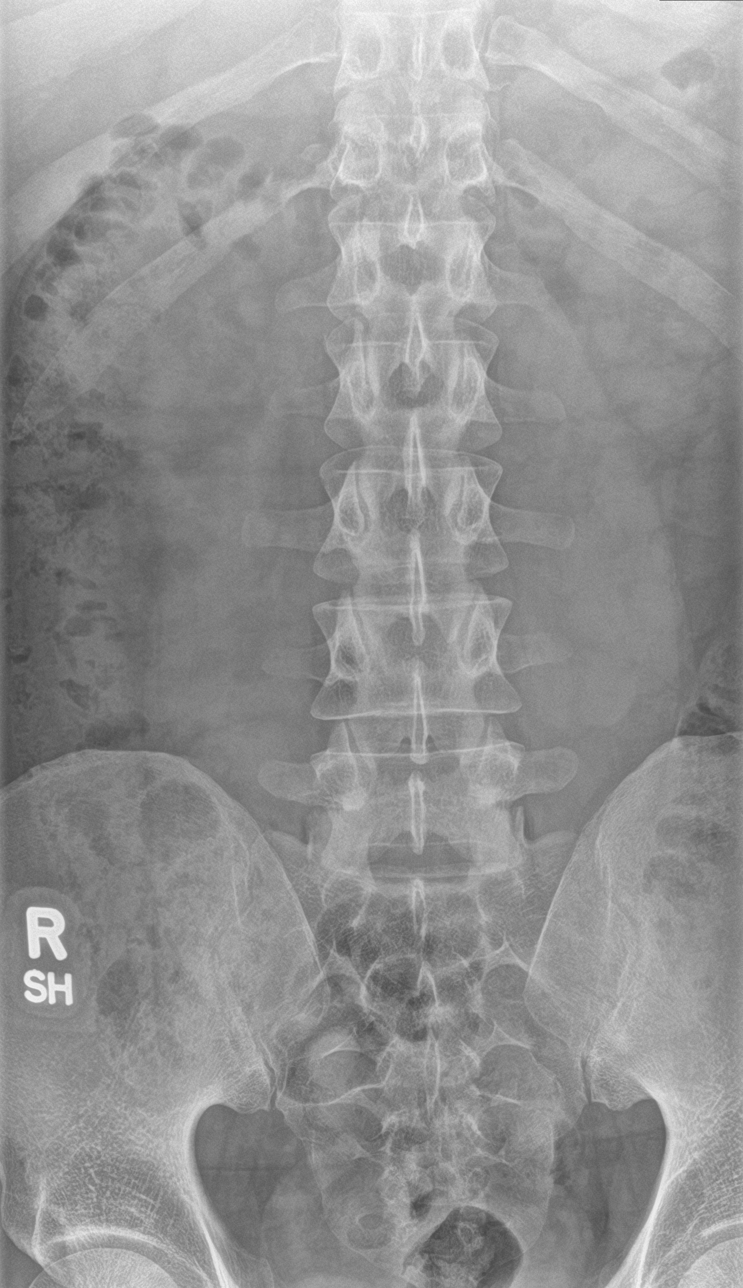

[l-spine obl (1 of 2)]
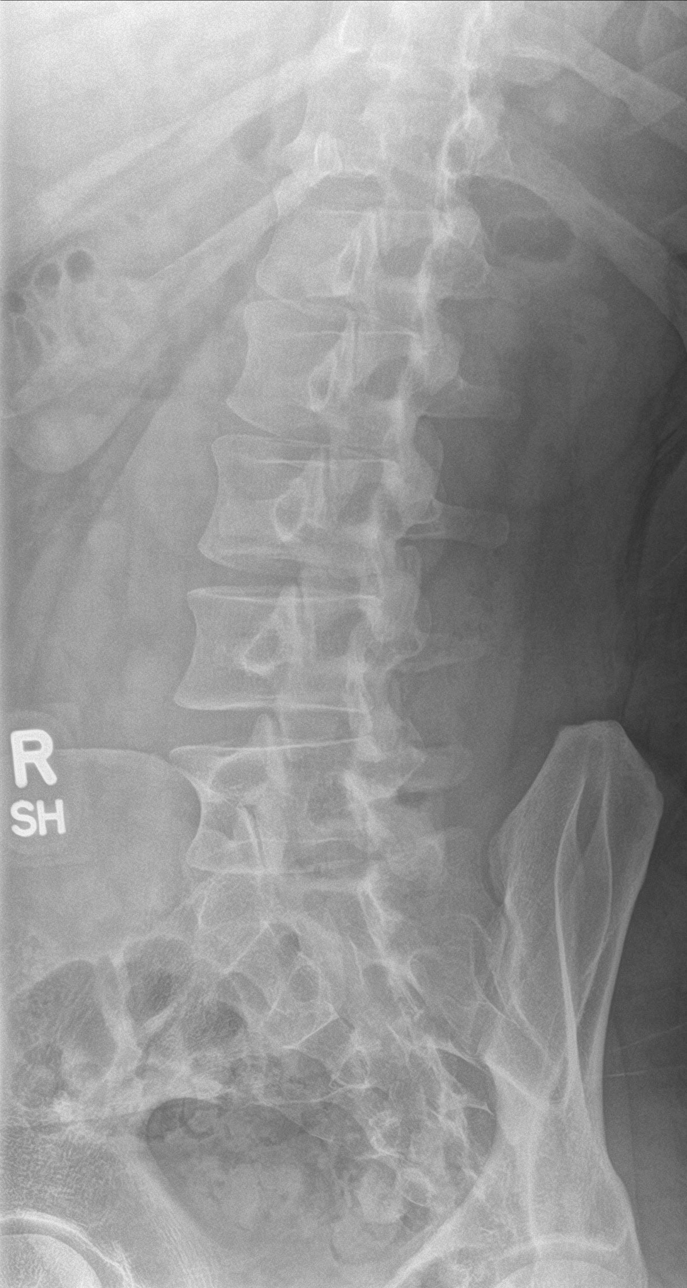

[l-spine obl (2 of 2)]
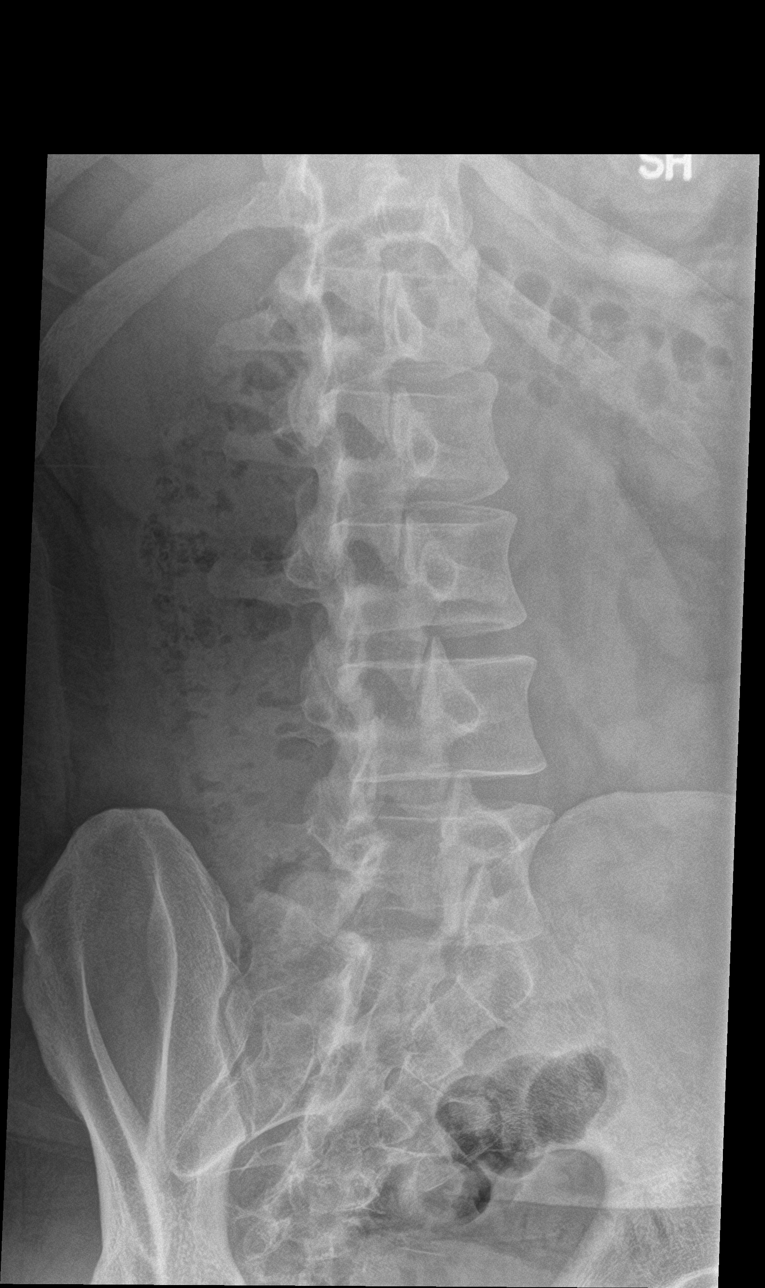

[l-spine lat]
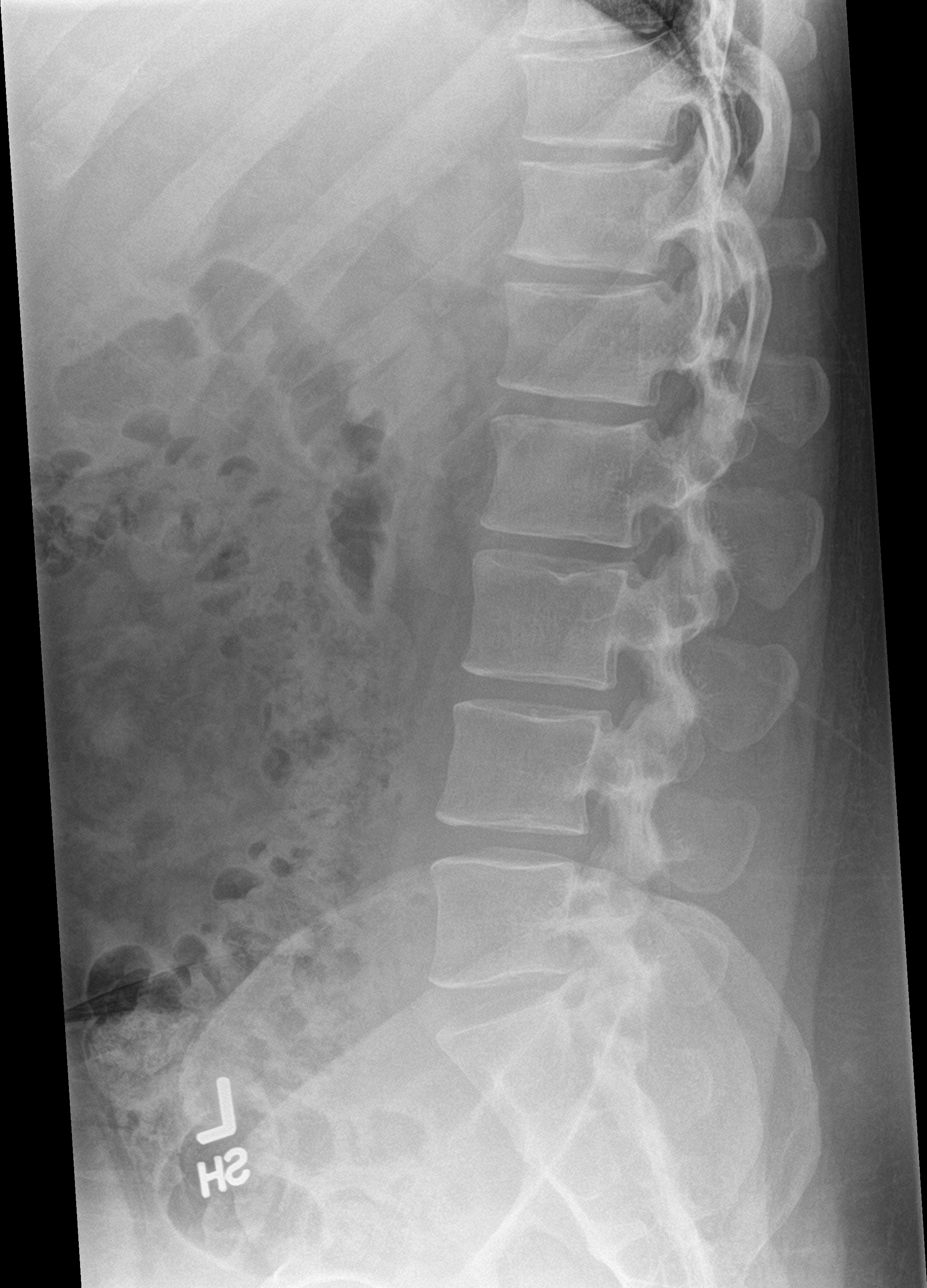

[l-spine spot]
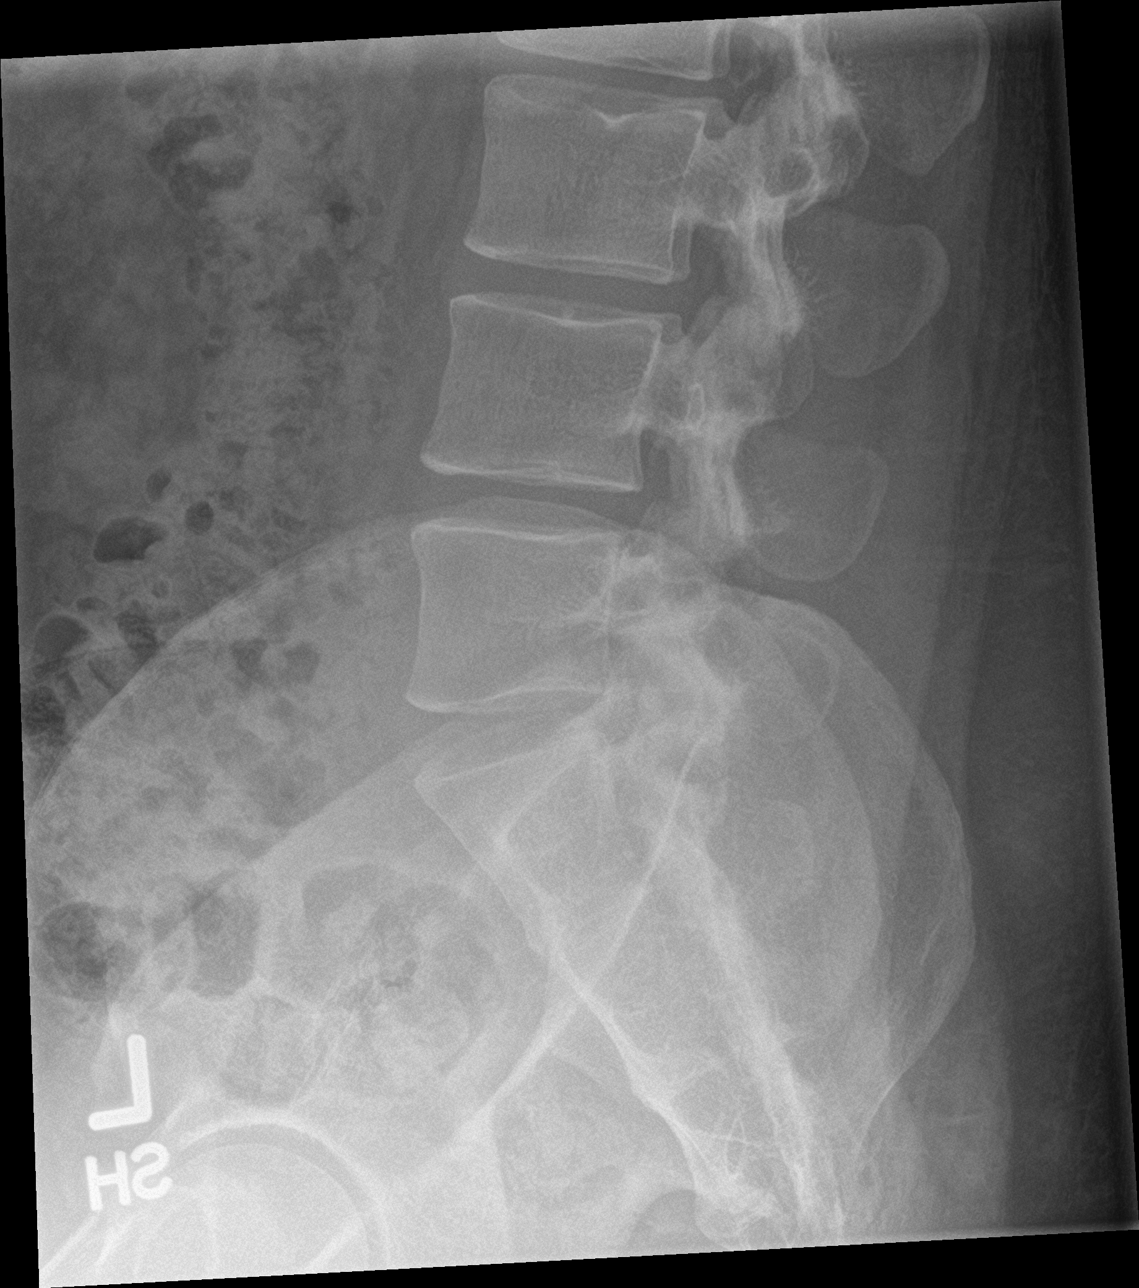

[5 of 5 positions shown; findings below may reference images not displayed]

FINDINGS: There is no evidence of lumbar spine fracture. Alignment is normal.
Intervertebral disc spaces are maintained.
IMPRESSION: Negative.

## 2019-05-25 MED ORDER — MELOXICAM 15 MG PO TABS: 15.0000 mg | ORAL_TABLET | Freq: Every day | ORAL | 1 refills | Status: DC

## 2019-05-25 MED ORDER — DICLOFENAC SODIUM 1 % TD GEL: 4.0000 g | Freq: Four times a day (QID) | TRANSDERMAL | 1 refills | Status: DC

## 2019-05-25 MED ORDER — CYCLOBENZAPRINE HCL 10 MG PO TABS: 10.0000 mg | ORAL_TABLET | Freq: Three times a day (TID) | ORAL | 0 refills | Status: DC | PRN

## 2019-05-25 MED FILL — CYCLOBENZAPRINE HCL 10 MG T: 10 | 10 days supply | Qty: 30 | Fill #0

## 2019-05-25 MED FILL — DICLOFENAC SODIUM 1 % GEL: 1 | 6 days supply | Qty: 100 | Fill #0

## 2019-05-25 MED FILL — MELOXICAM 15 MG TABLET: 15 | 30 days supply | Qty: 30 | Fill #0
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
## 2019-05-25 NOTE — Progress Notes (Signed)
Alignment of spine is normal with good disc space. No acute findings. Continue with things discussed in office for spine health.

## 2019-05-25 NOTE — Patient Instructions (Addendum)
Raymond Owens and Raymond Owens online.  Chiropractic   Plantar Fasciitis Rehab Ask your health care provider which exercises are safe for you. Do exercises exactly as told by your health care provider and adjust them as directed. It is normal to feel mild stretching, pulling, tightness, or discomfort as you do these exercises. Stop right away if you feel sudden pain or your pain gets worse. Do not begin these exercises until told by your health care provider. Stretching and range-of-motion exercises These exercises warm up your muscles and joints and improve the movement and flexibility of your foot. These exercises also help to relieve pain. Plantar fascia stretch  1. Sit with your left / right leg crossed over your opposite knee. 2. Hold your heel with one hand with that thumb near your arch. With your other hand, hold your toes and gently pull them back toward the top of your foot. You should feel a stretch on the bottom of your toes or your foot (plantar fascia) or both. 3. Hold this stretch for__________ seconds. 4. Slowly release your toes and return to the starting position. Repeat __________ times. Complete this exercise __________ times a day. Gastrocnemius stretch, standing This exercise is also called a calf (gastroc) stretch. It stretches the muscles in the back of the upper calf. 1. Stand with your hands against a wall. 2. Extend your left / right leg behind you, and bend your front knee slightly. 3. Keeping your heels on the floor and your back knee straight, shift your weight toward the wall. Do not arch your back. You should feel a gentle stretch in your upper left / right calf. 4. Hold this position for __________ seconds. Repeat __________ times. Complete this exercise __________ times a day. Soleus stretch, standing This exercise is also called a calf (soleus) stretch. It stretches the muscles in the back of the lower calf. 1. Stand with your hands against a wall. 2. Extend your left /  right leg behind you, and bend your front knee slightly. 3. Keeping your heels on the floor, bend your back knee and shift your weight slightly over your back leg. You should feel a gentle stretch deep in your lower calf. 4. Hold this position for __________ seconds. Repeat __________ times. Complete this exercise __________ times a day. Gastroc and soleus stretch, standing step This exercise stretches the muscles in the back of the lower leg. These muscles are in the upper calf (gastrocnemius) and the lower calf (soleus). 1. Stand with the ball of your left / right foot on a step. The ball of your foot is on the walking surface, right under your toes. 2. Keep your other foot firmly on the same step. 3. Hold on to the wall or a railing for balance. 4. Slowly lift your other foot, allowing your body weight to press your left / right heel down over the edge of the step. You should feel a stretch in your left / right calf. 5. Hold this position for __________ seconds. 6. Return both feet to the step. 7. Repeat this exercise with a slight bend in your left / right knee. Repeat __________ times with your left / right knee straight and __________ times with your left / right knee bent. Complete this exercise __________ times a day. Balance exercise This exercise builds your balance and strength control of your arch to help take pressure off your plantar fascia. Single leg stand If this exercise is too easy, you can try it with your  eyes closed or while standing on a pillow. 1. Without shoes, stand near a railing or in a doorway. You may hold on to the railing or door frame as needed. 2. Stand on your left / right foot. Keep your big toe down on the floor and try to keep your arch lifted. Do not let your foot roll inward. 3. Hold this position for __________ seconds. Repeat __________ times. Complete this exercise __________ times a day. This information is not intended to replace advice given to you  by your health care provider. Make sure you discuss any questions you have with your health care provider. Document Released: 08/11/2005 Document Revised: 12/02/2018 Document Reviewed: 06/09/2018 Elsevier Patient Education  2020 [employer].  Low Back Sprain or Strain Rehab Ask your health care provider which exercises are safe for you. Do exercises exactly as told by your health care provider and adjust them as directed. It is normal to feel mild stretching, pulling, tightness, or discomfort as you do these exercises. Stop right away if you feel sudden pain or your pain gets worse. Do not begin these exercises until told by your health care provider. Stretching and range-of-motion exercises These exercises warm up your muscles and joints and improve the movement and flexibility of your back. These exercises also help to relieve pain, numbness, and tingling. Lumbar rotation  5. Lie on your back on a firm surface and bend your knees. 6. Straighten your arms out to your sides so each arm forms a 90-degree angle (right angle) with a side of your body. 7. Slowly move (rotate) both of your knees to one side of your body until you feel a stretch in your lower back (lumbar). Try not to let your shoulders lift off the floor. 8. Hold this position for __________ seconds. 9. Tense your abdominal muscles and slowly move your knees back to the starting position. 10. Repeat this exercise on the other side of your body. Repeat __________ times. Complete this exercise __________ times a day. Single knee to chest  1. Lie on your back on a firm surface with both legs straight. 2. Bend one of your knees. Use your hands to move your knee up toward your chest until you feel a gentle stretch in your lower back and buttock. ? Hold your leg in this position by holding on to the front of your knee. ? Keep your other leg as straight as possible. 3. Hold this position for __________ seconds. 4. Slowly return to the  starting position. 5. Repeat with your other leg. Repeat __________ times. Complete this exercise __________ times a day. Prone extension on elbows  1. Lie on your abdomen on a firm surface (prone position). 2. Prop yourself up on your elbows. 3. Use your arms to help lift your chest up until you feel a gentle stretch in your abdomen and your lower back. ? This will place some of your body weight on your elbows. If this is uncomfortable, try stacking pillows under your chest. ? Your hips should stay down, against the surface that you are lying on. Keep your hip and back muscles relaxed. 4. Hold this position for __________ seconds. 5. Slowly relax your upper body and return to the starting position. Repeat __________ times. Complete this exercise __________ times a day. Strengthening exercises These exercises build strength and endurance in your back. Endurance is the ability to use your muscles for a long time, even after they get tired. Pelvic tilt This exercise strengthens the  muscles that lie deep in the abdomen. 1. Lie on your back on a firm surface. Bend your knees and keep your feet flat on the floor. 2. Tense your abdominal muscles. Tip your pelvis up toward the ceiling and flatten your lower back into the floor. ? To help with this exercise, you may place a small towel under your lower back and try to push your back into the towel. 3. Hold this position for __________ seconds. 4. Let your muscles relax completely before you repeat this exercise. Repeat __________ times. Complete this exercise __________ times a day. Alternating arm and leg raises  1. Get on your hands and knees on a firm surface. If you are on a hard floor, you may want to use padding, such as an exercise mat, to cushion your knees. 2. Line up your arms and legs. Your hands should be directly below your shoulders, and your knees should be directly below your hips. 3. Lift your left leg behind you. At the same  time, raise your right arm and straighten it in front of you. ? Do not lift your leg higher than your hip. ? Do not lift your arm higher than your shoulder. ? Keep your abdominal and back muscles tight. ? Keep your hips facing the ground. ? Do not arch your back. ? Keep your balance carefully, and do not hold your breath. 4. Hold this position for __________ seconds. 5. Slowly return to the starting position. 6. Repeat with your right leg and your left arm. Repeat __________ times. Complete this exercise __________ times a day. Abdominal set with straight leg raise  1. Lie on your back on a firm surface. 2. Bend one of your knees and keep your other leg straight. 3. Tense your abdominal muscles and lift your straight leg up, 4-6 inches (10-15 cm) off the ground. 4. Keep your abdominal muscles tight and hold this position for __________ seconds. ? Do not hold your breath. ? Do not arch your back. Keep it flat against the ground. 5. Keep your abdominal muscles tense as you slowly lower your leg back to the starting position. 6. Repeat with your other leg. Repeat __________ times. Complete this exercise __________ times a day. Single leg lower with bent knees 1. Lie on your back on a firm surface. 2. Tense your abdominal muscles and lift your feet off the floor, one foot at a time, so your knees and hips are bent in 90-degree angles (right angles). ? Your knees should be over your hips and your lower legs should be parallel to the floor. 3. Keeping your abdominal muscles tense and your knee bent, slowly lower one of your legs so your toe touches the ground. 4. Lift your leg back up to return to the starting position. ? Do not hold your breath. ? Do not let your back arch. Keep your back flat against the ground. 5. Repeat with your other leg. Repeat __________ times. Complete this exercise __________ times a day. Posture and body mechanics Good posture and healthy body mechanics can help  to relieve stress in your body's tissues and joints. Body mechanics refers to the movements and positions of your body while you do your daily activities. Posture is part of body mechanics. Good posture means:  Your spine is in its natural S-curve position (neutral).  Your shoulders are pulled back slightly.  Your head is not tipped forward. Follow these guidelines to improve your posture and body mechanics in your everyday activities.  Standing   When standing, keep your spine neutral and your feet about hip width apart. Keep a slight bend in your knees. Your ears, shoulders, and hips should line up.  When you do a task in which you stand in one place for a long time, place one foot up on a stable object that is 2-4 inches (5-10 cm) high, such as a footstool. This helps keep your spine neutral. Sitting   When sitting, keep your spine neutral and keep your feet flat on the floor. Use a footrest, if necessary, and keep your thighs parallel to the floor. Avoid rounding your shoulders, and avoid tilting your head forward.  When working at a desk or a computer, keep your desk at a height where your hands are slightly lower than your elbows. Slide your chair under your desk so you are close enough to maintain good posture.  When working at a computer, place your monitor at a height where you are looking straight ahead and you do not have to tilt your head forward or downward to look at the screen. Resting  When lying down and resting, avoid positions that are most painful for you.  If you have pain with activities such as sitting, bending, stooping, or squatting, lie in a position in which your body does not bend very much. For example, avoid curling up on your side with your arms and knees near your chest (fetal position).  If you have pain with activities such as standing for a long time or reaching with your arms, lie with your spine in a neutral position and bend your knees slightly. Try  the following positions: ? Lying on your side with a pillow between your knees. ? Lying on your back with a pillow under your knees. Lifting   When lifting objects, keep your feet at least shoulder width apart and tighten your abdominal muscles.  Bend your knees and hips and keep your spine neutral. It is important to lift using the strength of your legs, not your back. Do not lock your knees straight out.  Always ask for help to lift heavy or awkward objects. This information is not intended to replace advice given to you by your health care provider. Make sure you discuss any questions you have with your health care provider. Document Released: 08/11/2005 Document Revised: 12/03/2018 Document Reviewed: 09/02/2018 Elsevier Patient Education  2020 [employer].

## 2019-05-26 LAB — COMPLETE METABOLIC PANEL WITH GFR
AG Ratio: 1.6 (calc) (ref 1.0–2.5)
ALT: 43 U/L (ref 9–46)
AST: 28 U/L (ref 10–40)
Albumin: 4.5 g/dL (ref 3.6–5.1)
Alkaline phosphatase (APISO): 69 U/L (ref 36–130)
BUN: 16 mg/dL (ref 7–25)
CO2: 31 mmol/L (ref 20–32)
Calcium: 10 mg/dL (ref 8.6–10.3)
Chloride: 103 mmol/L (ref 98–110)
Creat: 0.94 mg/dL (ref 0.60–1.35)
GFR, Est African American: 128 mL/min/{1.73_m2} (ref >=60)
GFR, Est Non African American: 111 mL/min/{1.73_m2} (ref >=60)
Globulin: 2.8 g/dL (calc) (ref 1.9–3.7)
Glucose, Bld: 86 mg/dL (ref 65–99)
Potassium: 4.3 mmol/L (ref 3.5–5.3)
Sodium: 139 mmol/L (ref 135–146)
Total Bilirubin: 0.6 mg/dL (ref 0.2–1.2)
Total Protein: 7.3 g/dL (ref 6.1–8.1)

## 2019-05-26 LAB — CBC WITH DIFFERENTIAL/PLATELET
Absolute Monocytes: 681 cells/uL (ref 200–950)
Basophils Absolute: 37 cells/uL (ref 0–200)
Basophils Relative: 0.5 %
Eosinophils Absolute: 133 cells/uL (ref 15–500)
Eosinophils Relative: 1.8 %
HCT: 46.9 % (ref 38.5–50.0)
Hemoglobin: 15.9 g/dL (ref 13.2–17.1)
Lymphs Abs: 2338 cells/uL (ref 850–3900)
MCH: 30.7 pg (ref 27.0–33.0)
MCHC: 33.9 g/dL (ref 32.0–36.0)
MCV: 90.5 fL (ref 80.0–100.0)
MPV: 11.2 fL (ref 7.5–12.5)
Monocytes Relative: 9.2 %
Neutro Abs: 4211 cells/uL (ref 1500–7800)
Neutrophils Relative %: 56.9 %
Platelets: 264 10*3/uL (ref 140–400)
RBC: 5.18 10*6/uL (ref 4.20–5.80)
RDW: 12.7 % (ref 11.0–15.0)
Total Lymphocyte: 31.6 %
WBC: 7.4 10*3/uL (ref 3.8–10.8)

## 2019-05-26 LAB — LIPID PANEL W/REFLEX DIRECT LDL
Cholesterol: 186 mg/dL (ref <200)
HDL: 32 mg/dL — ABNORMAL LOW (ref >=40)
LDL Cholesterol (Calc): 103 mg/dL (calc) — ABNORMAL HIGH
Non-HDL Cholesterol (Calc): 154 mg/dL (calc) — ABNORMAL HIGH (ref <130)
Total CHOL/HDL Ratio: 5.8 (calc) — ABNORMAL HIGH (ref <5.0)
Triglycerides: 383 mg/dL — ABNORMAL HIGH (ref <150)

## 2019-05-26 NOTE — Progress Notes (Signed)
Raymond Owens,   Your LDL looks good but your TG are elevated. Need to start Fish oil 4000mg  daily and decreased fried/processed foods as well as watch sugars. Lets recheck before you lose insurance if not improving there is medication to help with this.   All other labs look great.

## 2019-07-06 ENCOUNTER — Emergency Department (HOSPITAL_BASED_OUTPATIENT_CLINIC_OR_DEPARTMENT_OTHER): Payer: 59

## 2019-07-06 ENCOUNTER — Encounter (HOSPITAL_BASED_OUTPATIENT_CLINIC_OR_DEPARTMENT_OTHER): Payer: Self-pay | Admitting: Emergency Medicine

## 2019-07-06 ENCOUNTER — Inpatient Hospital Stay (HOSPITAL_BASED_OUTPATIENT_CLINIC_OR_DEPARTMENT_OTHER)
Admission: EM | Admit: 2019-07-06 | Discharge: 2019-07-14 | DRG: 871 | Disposition: A | Discharge status: Home Health | Payer: 59 | Attending: Internal Medicine | Admitting: Internal Medicine

## 2019-07-06 ENCOUNTER — Other Ambulatory Visit: Payer: Self-pay

## 2019-07-06 DIAGNOSIS — R945 Abnormal results of liver function studies: Secondary | ICD-10-CM | POA: Diagnosis not present

## 2019-07-06 DIAGNOSIS — R0902 Hypoxemia: Secondary | ICD-10-CM | POA: Diagnosis not present

## 2019-07-06 DIAGNOSIS — R0602 Shortness of breath: Secondary | ICD-10-CM | POA: Diagnosis not present

## 2019-07-06 DIAGNOSIS — E669 Obesity, unspecified: Secondary | ICD-10-CM | POA: Diagnosis present

## 2019-07-06 DIAGNOSIS — R1110 Vomiting, unspecified: Secondary | ICD-10-CM | POA: Diagnosis not present

## 2019-07-06 DIAGNOSIS — R5081 Fever presenting with conditions classified elsewhere: Secondary | ICD-10-CM

## 2019-07-06 DIAGNOSIS — R002 Palpitations: Secondary | ICD-10-CM | POA: Diagnosis not present

## 2019-07-06 DIAGNOSIS — G039 Meningitis, unspecified: Secondary | ICD-10-CM | POA: Diagnosis not present

## 2019-07-06 DIAGNOSIS — G4700 Insomnia, unspecified: Secondary | ICD-10-CM | POA: Diagnosis not present

## 2019-07-06 DIAGNOSIS — A419 Sepsis, unspecified organism: Principal | ICD-10-CM | POA: Diagnosis present

## 2019-07-06 DIAGNOSIS — R197 Diarrhea, unspecified: Secondary | ICD-10-CM | POA: Diagnosis present

## 2019-07-06 DIAGNOSIS — R6520 Severe sepsis without septic shock: Secondary | ICD-10-CM | POA: Diagnosis present

## 2019-07-06 DIAGNOSIS — R21 Rash and other nonspecific skin eruption: Secondary | ICD-10-CM | POA: Diagnosis present

## 2019-07-06 DIAGNOSIS — Z880 Allergy status to penicillin: Secondary | ICD-10-CM | POA: Diagnosis not present

## 2019-07-06 DIAGNOSIS — R238 Other skin changes: Secondary | ICD-10-CM | POA: Diagnosis present

## 2019-07-06 DIAGNOSIS — R7989 Other specified abnormal findings of blood chemistry: Secondary | ICD-10-CM | POA: Diagnosis not present

## 2019-07-06 DIAGNOSIS — Z87891 Personal history of nicotine dependence: Secondary | ICD-10-CM | POA: Diagnosis not present

## 2019-07-06 DIAGNOSIS — R519 Headache, unspecified: Secondary | ICD-10-CM | POA: Diagnosis present

## 2019-07-06 DIAGNOSIS — Z8639 Personal history of other endocrine, nutritional and metabolic disease: Secondary | ICD-10-CM

## 2019-07-06 DIAGNOSIS — R509 Fever, unspecified: Secondary | ICD-10-CM | POA: Diagnosis not present

## 2019-07-06 DIAGNOSIS — Z6832 Body mass index (BMI) 32.0-32.9, adult: Secondary | ICD-10-CM

## 2019-07-06 DIAGNOSIS — R609 Edema, unspecified: Secondary | ICD-10-CM | POA: Diagnosis not present

## 2019-07-06 DIAGNOSIS — R4182 Altered mental status, unspecified: Secondary | ICD-10-CM | POA: Diagnosis not present

## 2019-07-06 DIAGNOSIS — J9601 Acute respiratory failure with hypoxia: Secondary | ICD-10-CM | POA: Diagnosis not present

## 2019-07-06 DIAGNOSIS — J9600 Acute respiratory failure, unspecified whether with hypoxia or hypercapnia: Secondary | ICD-10-CM | POA: Diagnosis not present

## 2019-07-06 DIAGNOSIS — R3129 Other microscopic hematuria: Secondary | ICD-10-CM | POA: Diagnosis present

## 2019-07-06 DIAGNOSIS — E876 Hypokalemia: Secondary | ICD-10-CM | POA: Diagnosis not present

## 2019-07-06 DIAGNOSIS — E860 Dehydration: Secondary | ICD-10-CM | POA: Diagnosis present

## 2019-07-06 DIAGNOSIS — Z20828 Contact with and (suspected) exposure to other viral communicable diseases: Secondary | ICD-10-CM | POA: Diagnosis not present

## 2019-07-06 DIAGNOSIS — E871 Hypo-osmolality and hyponatremia: Secondary | ICD-10-CM | POA: Diagnosis not present

## 2019-07-06 DIAGNOSIS — G030 Nonpyogenic meningitis: Secondary | ICD-10-CM

## 2019-07-06 DIAGNOSIS — G009 Bacterial meningitis, unspecified: Secondary | ICD-10-CM | POA: Diagnosis present

## 2019-07-06 DIAGNOSIS — Z833 Family history of diabetes mellitus: Secondary | ICD-10-CM

## 2019-07-06 DIAGNOSIS — D72829 Elevated white blood cell count, unspecified: Secondary | ICD-10-CM

## 2019-07-06 DIAGNOSIS — R918 Other nonspecific abnormal finding of lung field: Secondary | ICD-10-CM | POA: Diagnosis not present

## 2019-07-06 DIAGNOSIS — Z791 Long term (current) use of non-steroidal anti-inflammatories (NSAID): Secondary | ICD-10-CM

## 2019-07-06 DIAGNOSIS — R0682 Tachypnea, not elsewhere classified: Secondary | ICD-10-CM | POA: Diagnosis not present

## 2019-07-06 DIAGNOSIS — Z8249 Family history of ischemic heart disease and other diseases of the circulatory system: Secondary | ICD-10-CM

## 2019-07-06 DIAGNOSIS — R0603 Acute respiratory distress: Secondary | ICD-10-CM | POA: Diagnosis not present

## 2019-07-06 DIAGNOSIS — I10 Essential (primary) hypertension: Secondary | ICD-10-CM | POA: Diagnosis not present

## 2019-07-06 LAB — CBC WITH DIFFERENTIAL/PLATELET
Abs Immature Granulocytes: 0.43 10*3/uL — ABNORMAL HIGH (ref 0.00–0.07)
Basophils Absolute: 0.1 10*3/uL (ref 0.0–0.1)
Basophils Relative: 0 %
Eosinophils Absolute: 0 10*3/uL (ref 0.0–0.5)
Eosinophils Relative: 0 %
HCT: 42.4 % (ref 39.0–52.0)
Hemoglobin: 14.4 g/dL (ref 13.0–17.0)
Immature Granulocytes: 2 %
Lymphocytes Relative: 4 %
Lymphs Abs: 1 10*3/uL (ref 0.7–4.0)
MCH: 29.6 pg (ref 26.0–34.0)
MCHC: 34 g/dL (ref 30.0–36.0)
MCV: 87.1 fL (ref 80.0–100.0)
Monocytes Absolute: 1.7 10*3/uL — ABNORMAL HIGH (ref 0.1–1.0)
Monocytes Relative: 6 %
Neutro Abs: 25.2 10*3/uL — ABNORMAL HIGH (ref 1.7–7.7)
Neutrophils Relative %: 88 %
Platelets: 234 10*3/uL (ref 150–400)
RBC: 4.87 MIL/uL (ref 4.22–5.81)
RDW: 13 % (ref 11.5–15.5)
WBC: 28.7 10*3/uL — ABNORMAL HIGH (ref 4.0–10.5)
nRBC: 0 % (ref 0.0–0.2)

## 2019-07-06 LAB — URINALYSIS, ROUTINE W REFLEX MICROSCOPIC
Bilirubin Urine: NEGATIVE
Glucose, UA: NEGATIVE mg/dL
Ketones, ur: NEGATIVE mg/dL
Leukocytes,Ua: NEGATIVE
Nitrite: NEGATIVE
Protein, ur: 100 mg/dL — AB
Specific Gravity, Urine: 1.02 (ref 1.005–1.030)
pH: 6.5 (ref 5.0–8.0)

## 2019-07-06 LAB — COMPREHENSIVE METABOLIC PANEL
ALT: 54 U/L — ABNORMAL HIGH (ref 0–44)
AST: 45 U/L — ABNORMAL HIGH (ref 15–41)
Albumin: 3.5 g/dL (ref 3.5–5.0)
Alkaline Phosphatase: 75 U/L (ref 38–126)
Anion gap: 13 (ref 5–15)
BUN: 19 mg/dL (ref 6–20)
CO2: 22 mmol/L (ref 22–32)
Calcium: 8.7 mg/dL — ABNORMAL LOW (ref 8.9–10.3)
Chloride: 97 mmol/L — ABNORMAL LOW (ref 98–111)
Creatinine, Ser: 0.86 mg/dL (ref 0.61–1.24)
GFR calc Af Amer: >60 mL/min (ref >60)
GFR calc non Af Amer: >60 mL/min (ref >60)
Glucose, Bld: 144 mg/dL — ABNORMAL HIGH (ref 70–99)
Potassium: 2.9 mmol/L — ABNORMAL LOW (ref 3.5–5.1)
Sodium: 132 mmol/L — ABNORMAL LOW (ref 135–145)
Total Bilirubin: 0.9 mg/dL (ref 0.3–1.2)
Total Protein: 7.8 g/dL (ref 6.5–8.1)

## 2019-07-06 LAB — URINALYSIS, MICROSCOPIC (REFLEX): WBC, UA: NONE SEEN WBC/hpf (ref 0–5)

## 2019-07-06 LAB — MAGNESIUM: Magnesium: 1.7 mg/dL (ref 1.7–2.4)

## 2019-07-06 LAB — LACTIC ACID, PLASMA
Lactic Acid, Venous: 3.3 mmol/L (ref 0.5–1.9)
Lactic Acid, Venous: 2.3 mmol/L (ref 0.5–1.9)
Lactic Acid, Venous: 2.2 mmol/L (ref 0.5–1.9)

## 2019-07-06 LAB — PROTIME-INR
INR: 1.4 — ABNORMAL HIGH (ref 0.8–1.2)
Prothrombin Time: 17.2 seconds — ABNORMAL HIGH (ref 11.4–15.2)

## 2019-07-06 LAB — D-DIMER, QUANTITATIVE: D-Dimer, Quant: 1.75 ug/mL-FEU — ABNORMAL HIGH (ref 0.00–0.50)

## 2019-07-06 IMAGING — DX DG CHEST 1V PORT
1 series · 1 of 1 positions shown · non-contrast
Comparison: None.

CLINICAL DATA: Fever with headache, nausea and vomiting 4 days.

EXAM:
PORTABLE CHEST 1 VIEW

[chest ap]
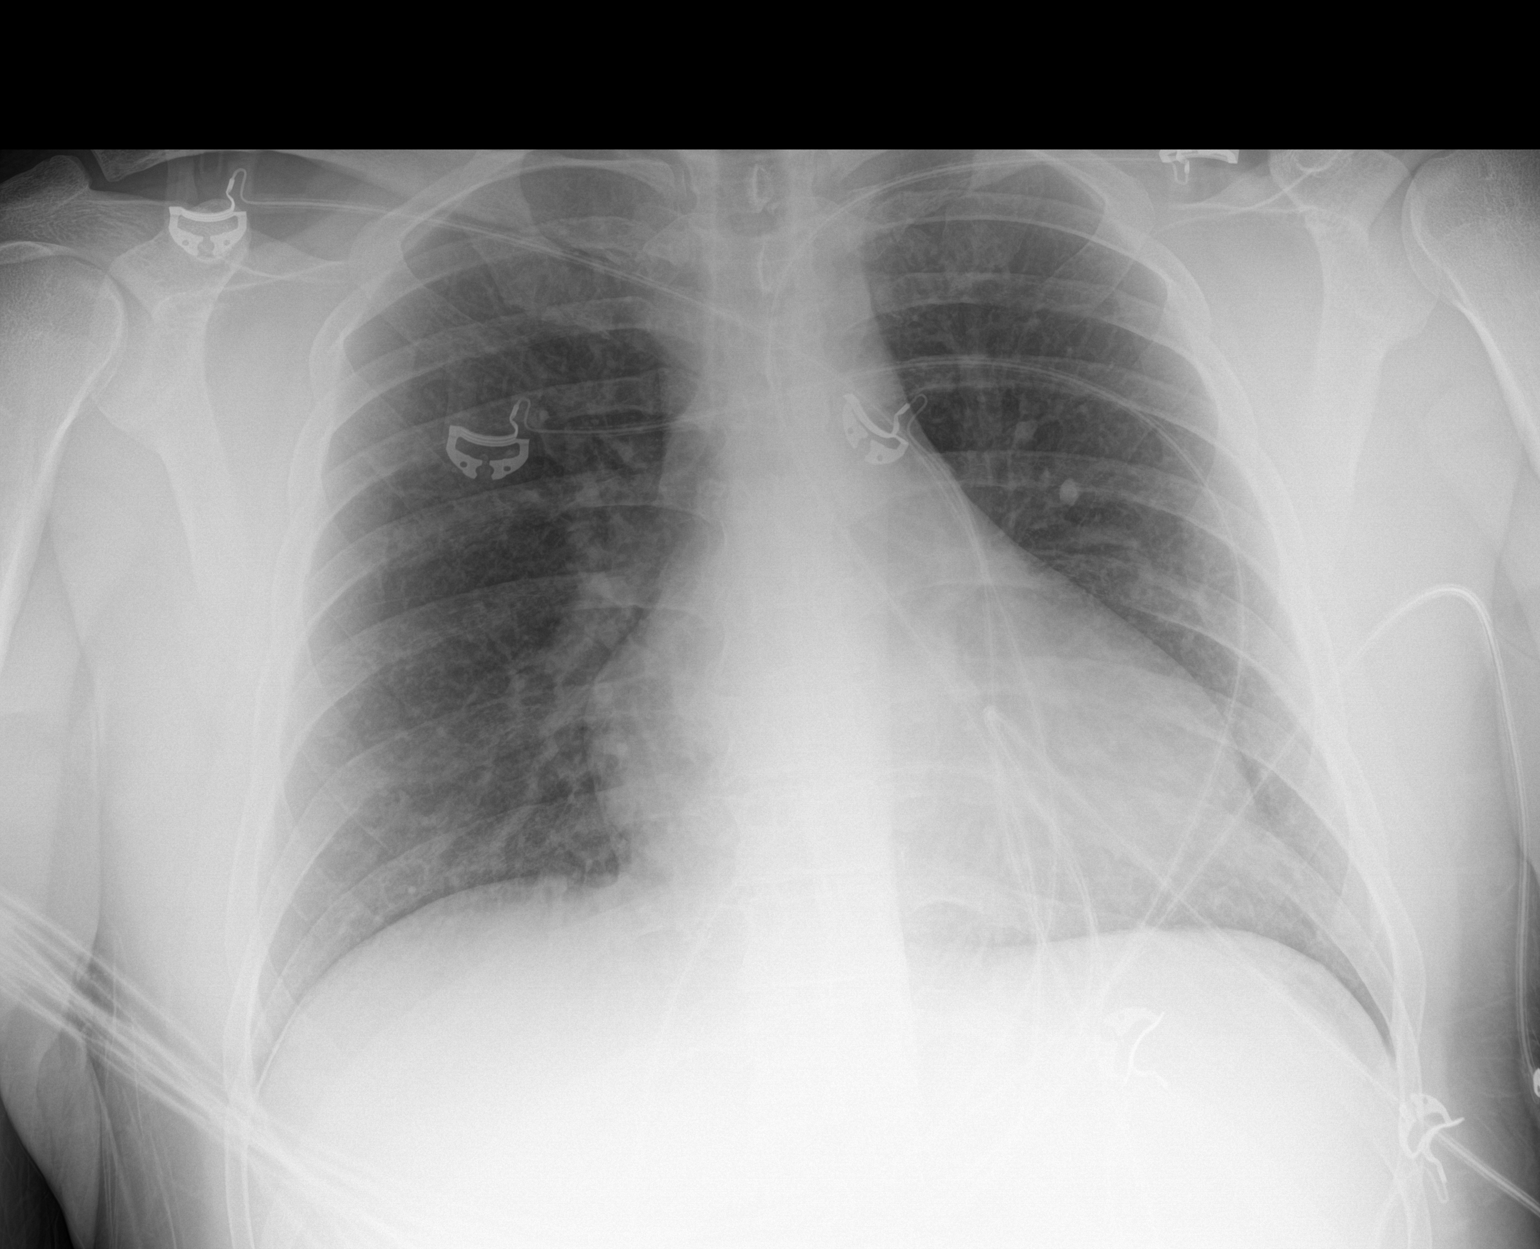

[1 of 1 positions shown; findings below may reference images not displayed]

FINDINGS: Lungs are adequately inflated and otherwise clear. Cardiomediastinal
silhouette and remainder of the exam is within normal.
IMPRESSION: No active disease.

## 2019-07-06 IMAGING — CT CT HEAD W/O CM
3 series · 15 of 47 positions shown, 18 images · non-contrast
Comparison: None.

CLINICAL DATA: Altered mental status

EXAM:
CT HEAD WITHOUT CONTRAST
TECHNIQUE: Contiguous axial images were obtained from the base of the skull
through the vertex without intravenous contrast.

[Series 2: head wo · axial · 0.48mm/px · z∈[-96,+29]mm · 9 of 30 slices shown, 12 images]
[im 3/30  brain]
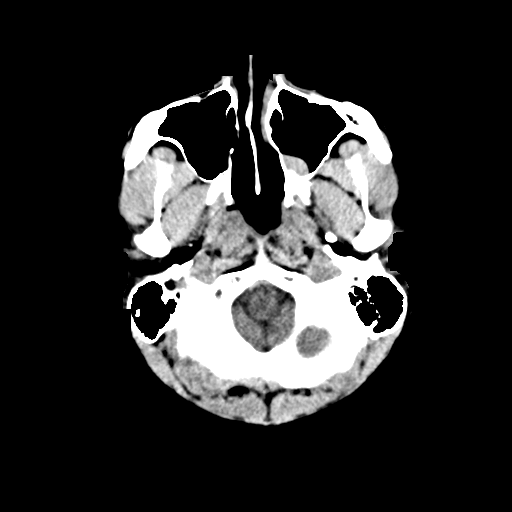
[im 3/30  bone]
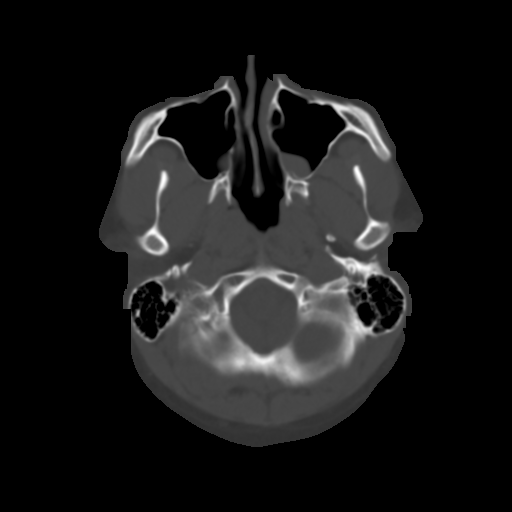
[im 6/30  brain]
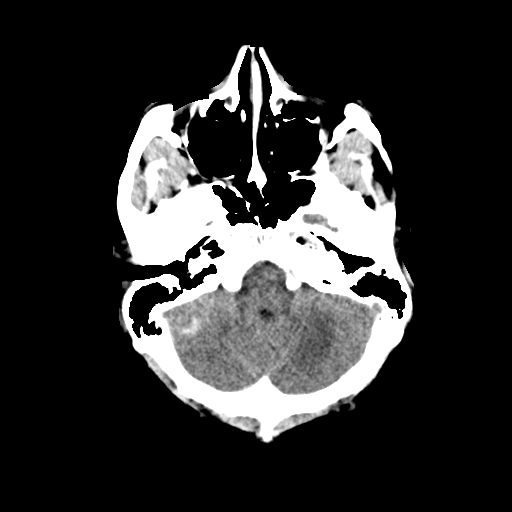
[im 9/30  brain]
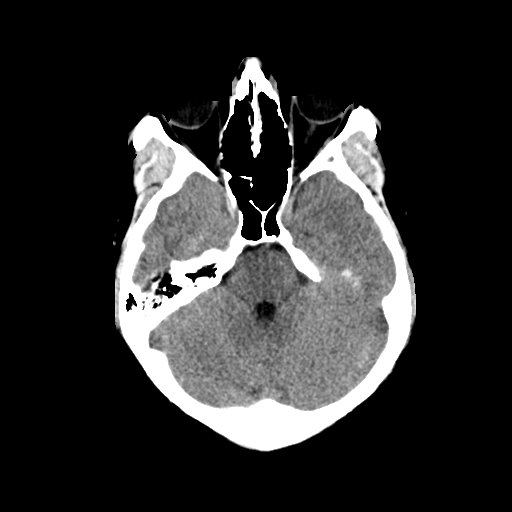
[im 12/30  brain]
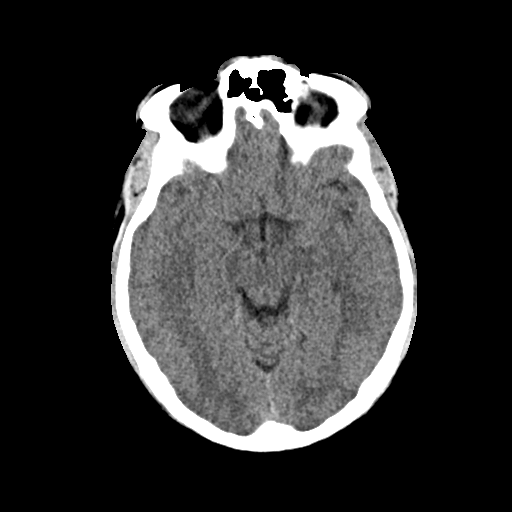
[im 16/30  brain]
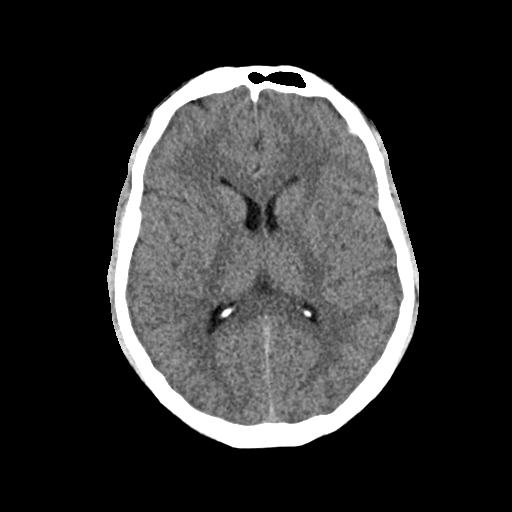
[im 16/30  bone]
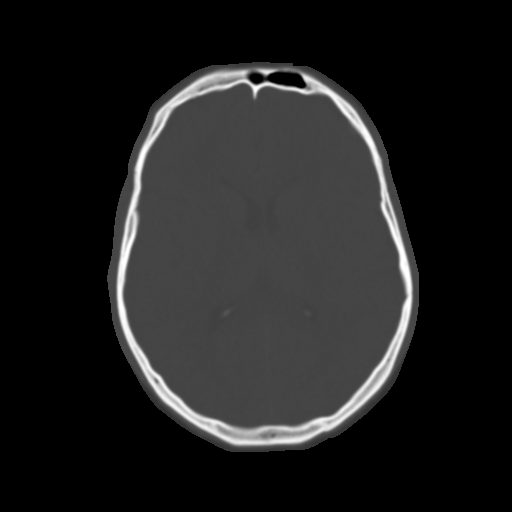
[im 19/30  brain]
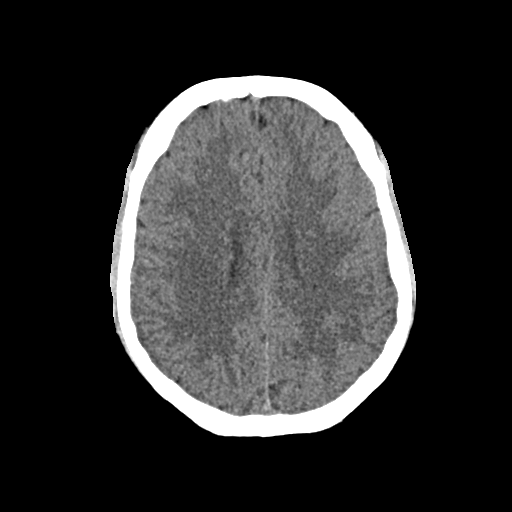
[im 22/30  brain]
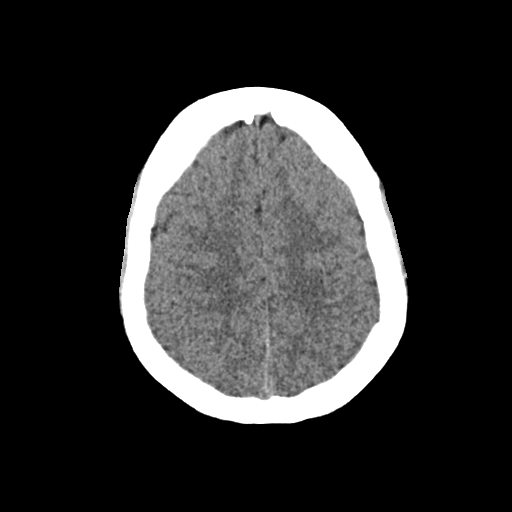
[im 25/30  brain]
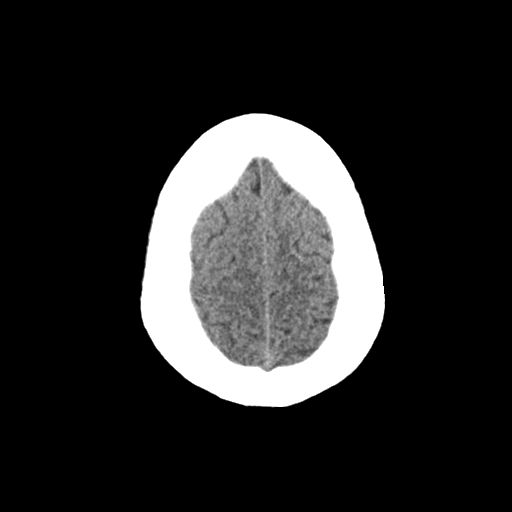
[im 28/30  brain]
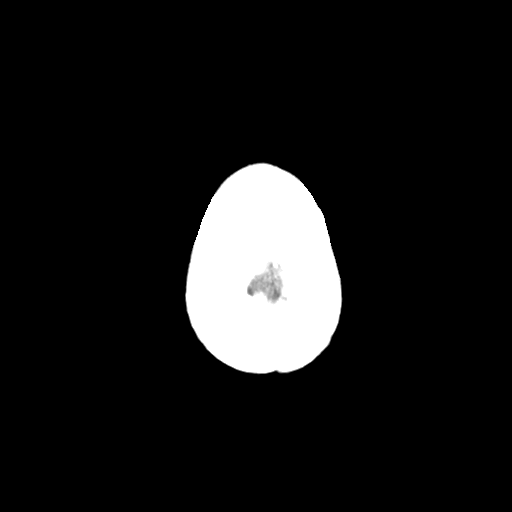
[im 28/30  bone]
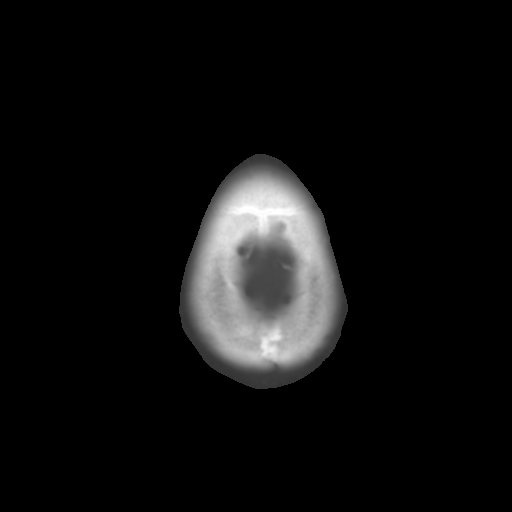

[Series 4: coronal soft · coronal · 0.30mm/px · 3 of 74 slices shown]
[im 25/74  brain]
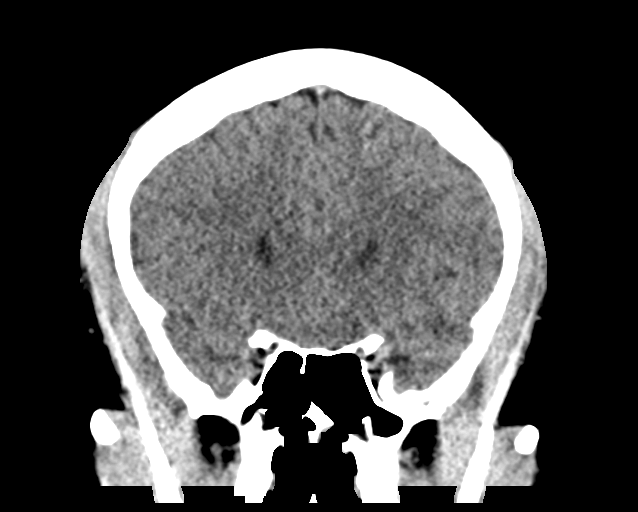
[im 33/74  brain]
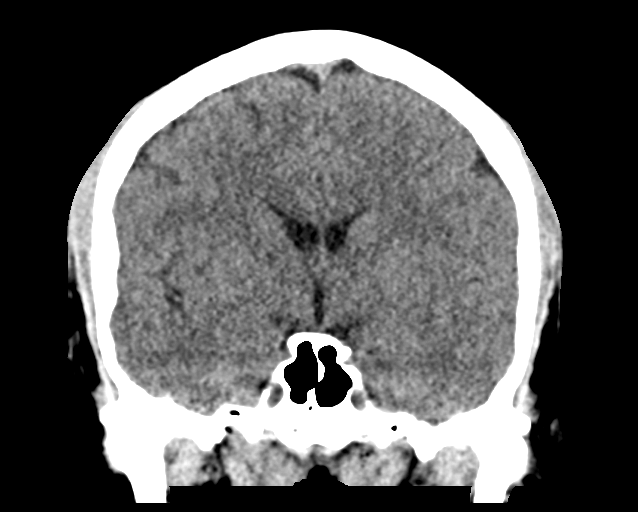
[im 41/74  brain]
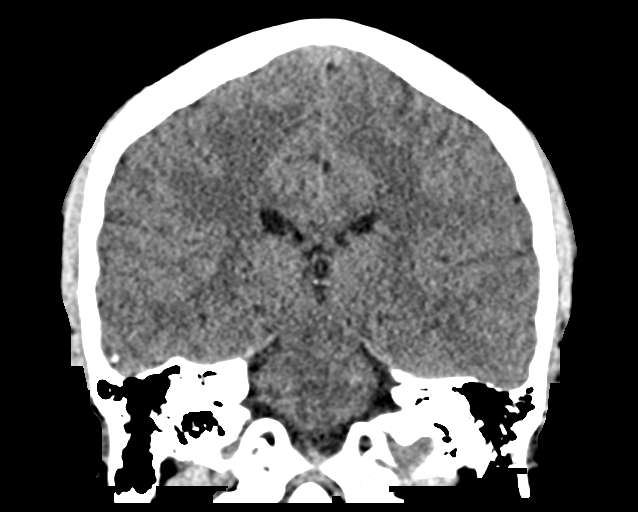

[Series 5: sag soft · sagittal · 0.29mm/px · 3 of 67 slices shown]
[im 23/67  brain]
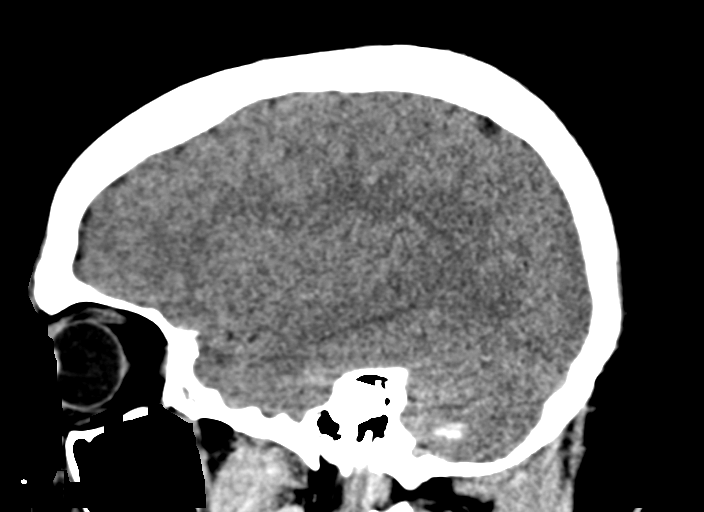
[im 34/67  brain]
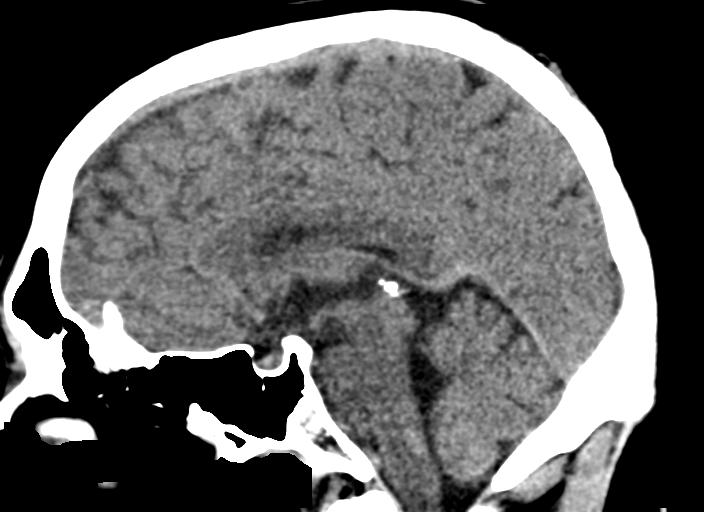
[im 45/67  brain]
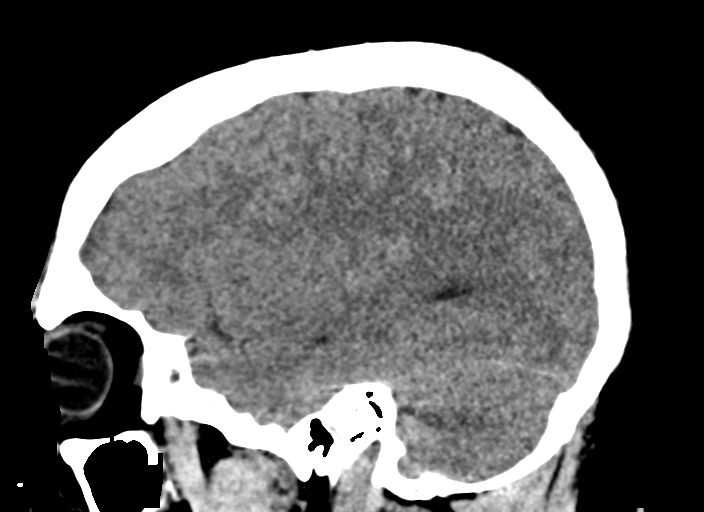

[15 of 47 positions shown; findings below may reference images not displayed]

FINDINGS: Brain: No evidence of acute infarction, hemorrhage, hydrocephalus,
extra-axial collection or mass lesion/mass effect.

Vascular: No hyperdense vessel or unexpected calcification.

Skull: Normal. Negative for fracture or focal lesion.

Sinuses/Orbits: No acute finding.

Other: None.
IMPRESSION: No acute intracranial pathology.

## 2019-07-06 IMAGING — CT CT CHEST W/O CM
2 of 3 series · 15 of 36 positions shown, 18 images · non-contrast
Comparison: Chest radiograph earlier today. Lung bases from
abdominal CT earlier today.

CLINICAL DATA: Chest pain. Fever and shortness of breath. Abnormal
lung base findings on abdominal CT.

EXAM:
CT CHEST WITHOUT CONTRAST
TECHNIQUE: Multidetector CT imaging of the chest was performed following the
standard protocol without IV contrast.

[Series 2: thorax · axial · 0.81mm/px · z∈[+1114,+1386]mm · 12 of 160 slices shown, 15 images]
[im 12/160  mediastinal]
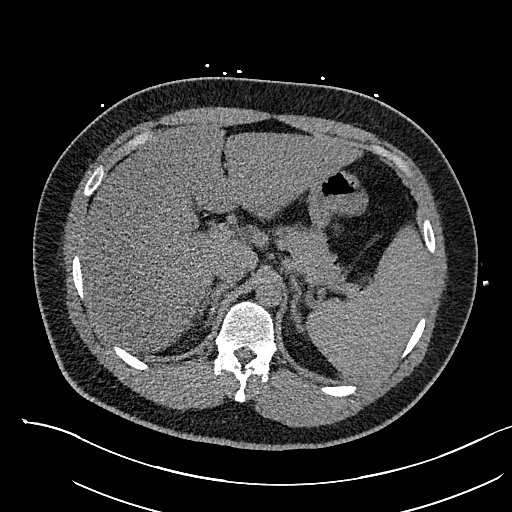
[im 12/160  lung]
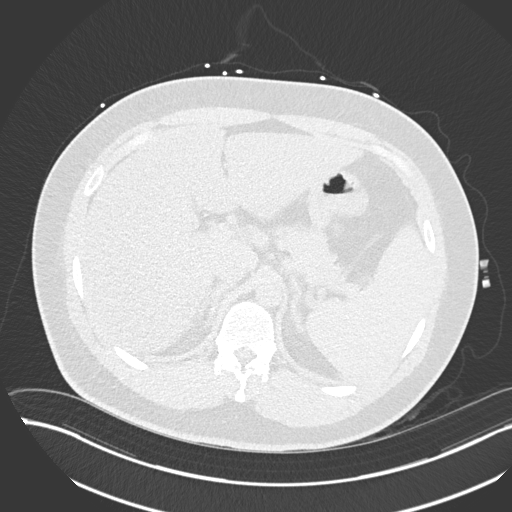
[im 24/160  lung]
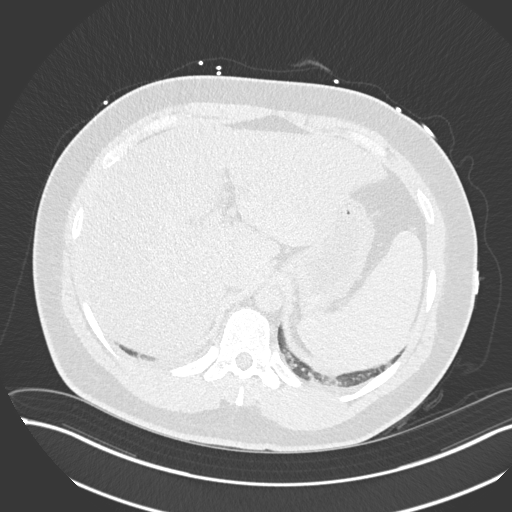
[im 36/160  lung]
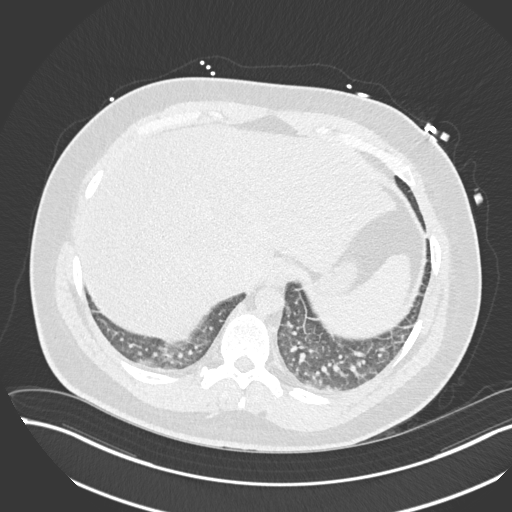
[im 48/160  lung]
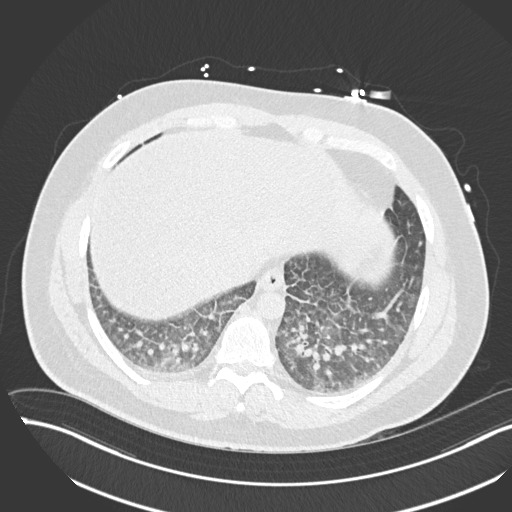
[im 59/160  mediastinal]
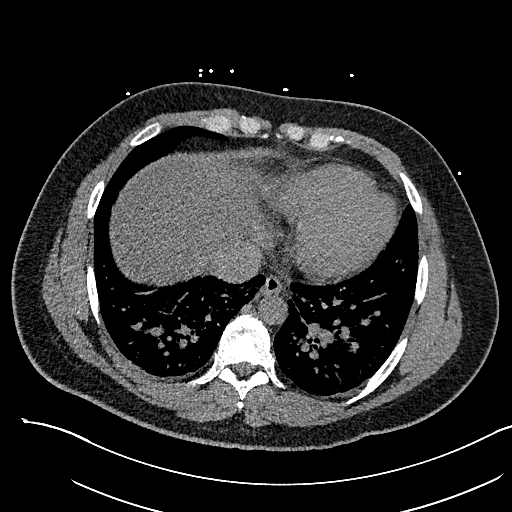
[im 59/160  lung]
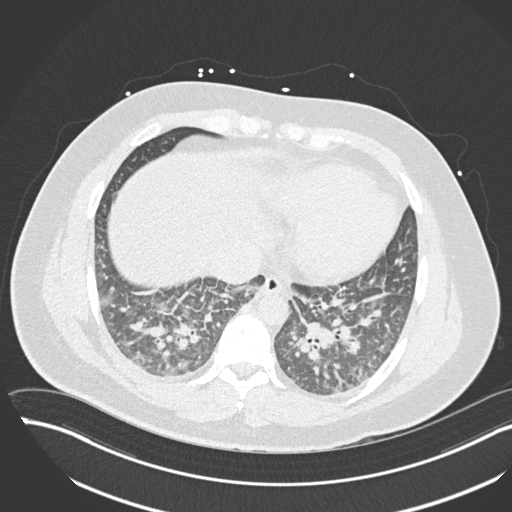
[im 71/160  lung]
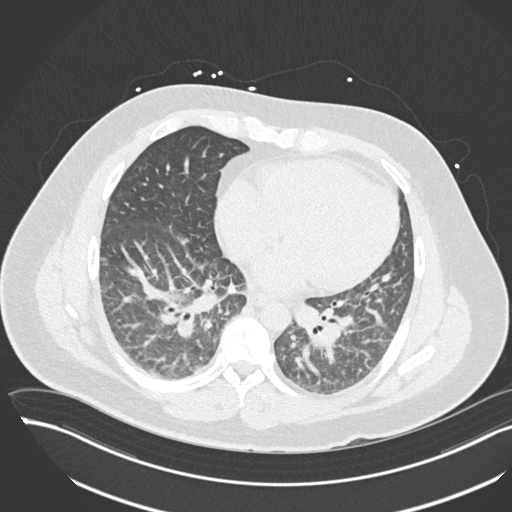
[im 89/160  lung]
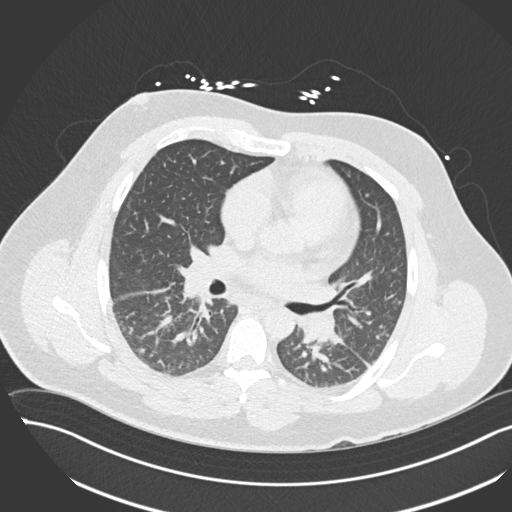
[im 101/160  lung]
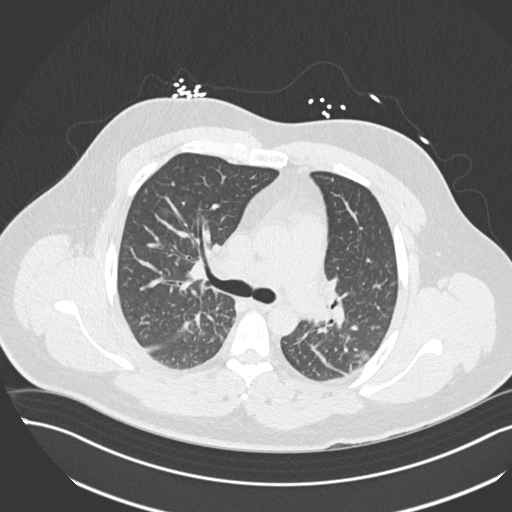
[im 112/160  mediastinal]
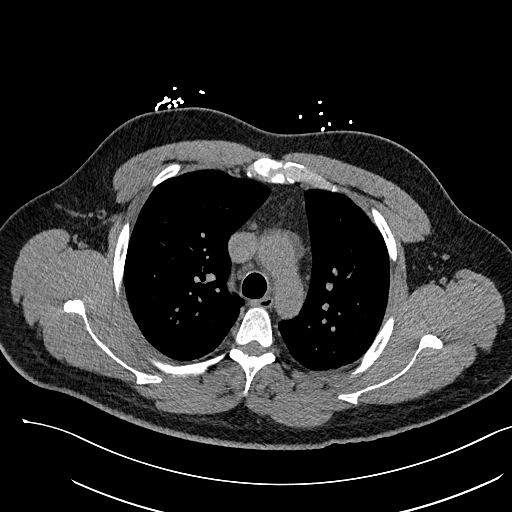
[im 112/160  lung]
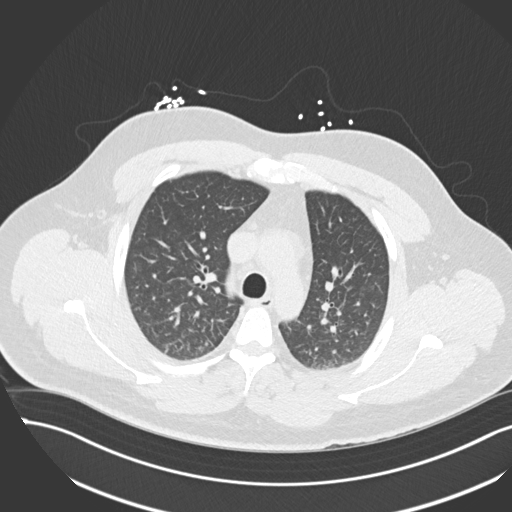
[im 124/160  lung]
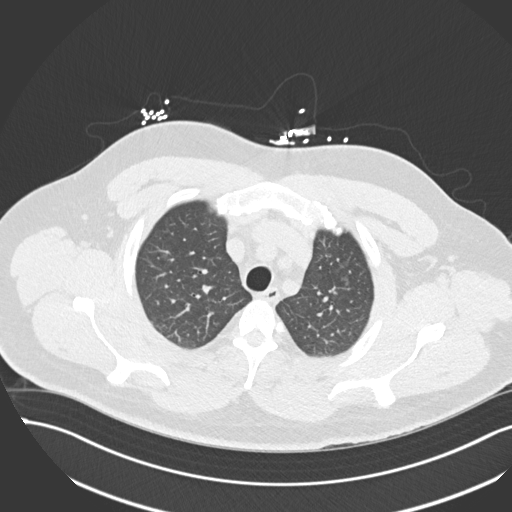
[im 136/160  lung]
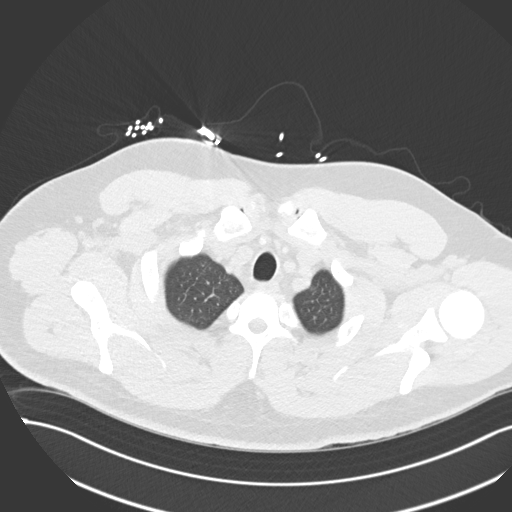
[im 148/160  lung]
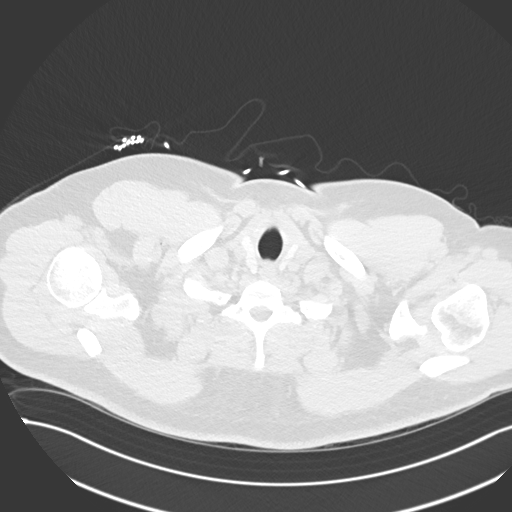

[Series 5: coronal · coronal · 0.62mm/px · 3 of 151 slices shown]
[im 31/151  lung]
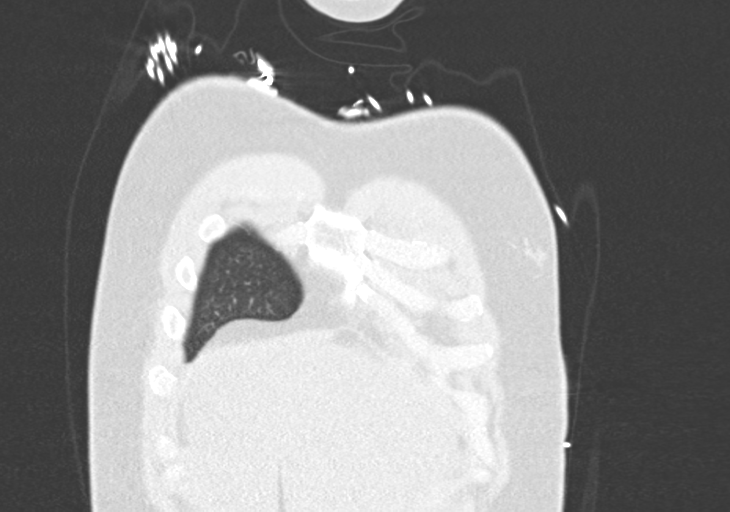
[im 61/151  lung]
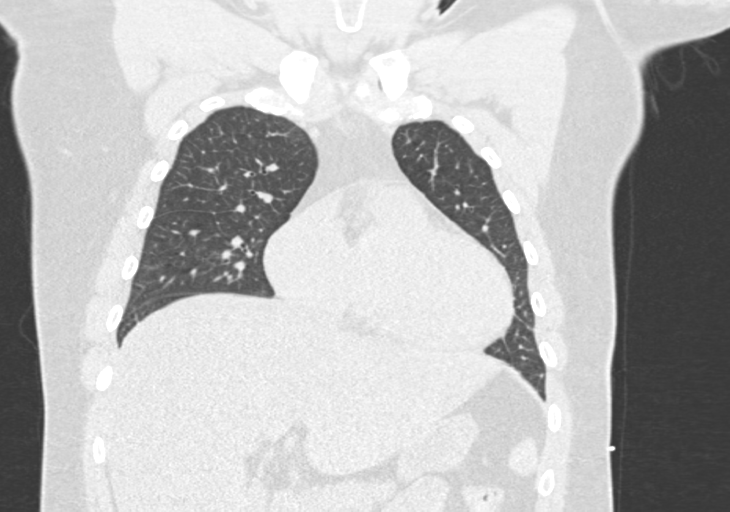
[im 91/151  lung]
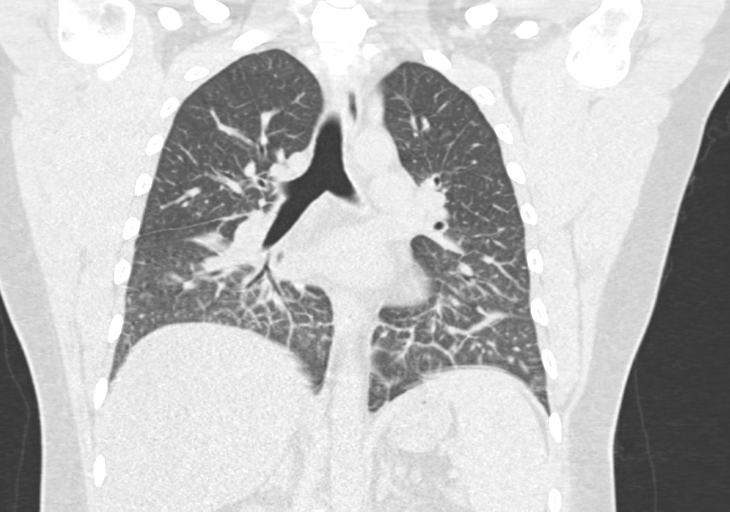

[15 of 36 positions shown; findings below may reference images not displayed]

FINDINGS: Cardiovascular: Thoracic aorta is normal in caliber. No pericardial
effusion. Upper normal heart size.

Mediastinum/Nodes: Small mediastinal lymph nodes not enlarged by
size criteria. Limited assessment for hilar adenopathy given lack of
IV contrast. The esophagus is decompressed. No visualized thyroid
nodule.

Lungs/Pleura: Mild smooth septal thickening. Scattered ill-defined
small pulmonary nodules and ground-glass opacities in both lungs,
slight dependent and lower lobe predominant. No cavitary component
Fissural thickening/fluid in the fissure with trace bilateral
pleural effusions. Trachea and mainstem bronchi are patent. Mild
bronchial thickening in the lower lobes.

Upper Abdomen: Hepatic steatosis. Assessed in full on dedicated
abdominal CT earlier this day.

Musculoskeletal: There are no acute or suspicious osseous
abnormalities.
IMPRESSION: 1. Mild septal thickening suspicious for pulmonary edema. Trace
bilateral pleural effusions.
2. Scattered ill-defined tiny pulmonary nodules in conjunction with
ground-glass opacities in both lungs. Favor atypical infectious or
other inflammatory etiology, (not classic for [U1] pneumonia).
Ground-glass opacity component may represent either edema or
atypical infection. Consider follow-up CT after course of treatment
to ensure resolution.

.

## 2019-07-06 IMAGING — CT CT ABD-PELV W/ CM
2 of 4 series · 16 of 46 positions shown, 18 images · IV contrast (Omnipaque)
Comparison: None.

CLINICAL DATA: Fever, headache, nausea, vomiting, abdominal pain

EXAM:
CT ABDOMEN AND PELVIS WITH CONTRAST
TECHNIQUE: Multidetector CT imaging of the abdomen and pelvis was performed
using the standard protocol following bolus administration of
intravenous contrast.
CONTRAST:  100mL OMNIPAQUE IOHEXOL 300 MG/ML  SOLN

[Series 2: axial st · axial · 0.81mm/px · z∈[-885,-395]mm · 13 of 108 slices shown, 15 images]
[im 5/108  soft-tissue]
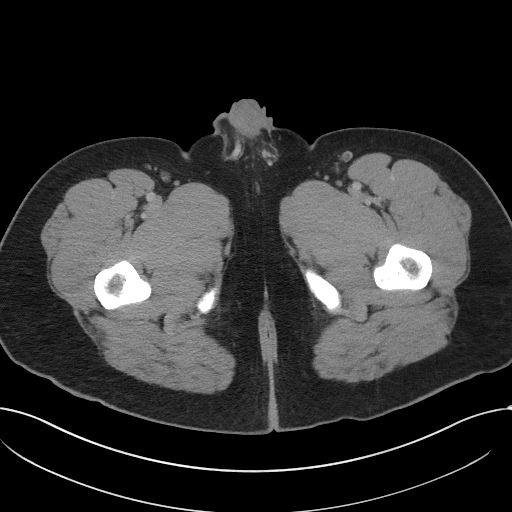
[im 5/108  bone]
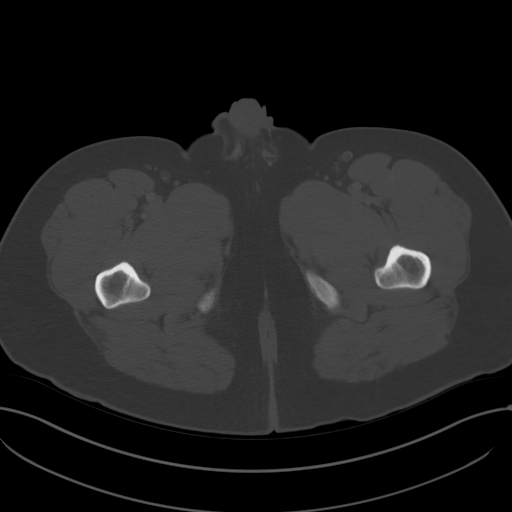
[im 14/108  soft-tissue]
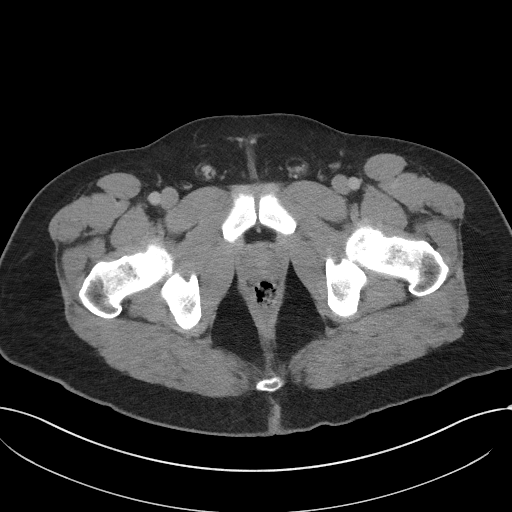
[im 23/108  soft-tissue]
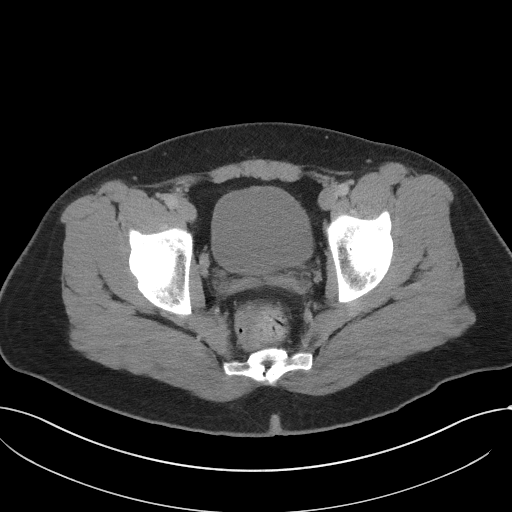
[im 32/108  soft-tissue]
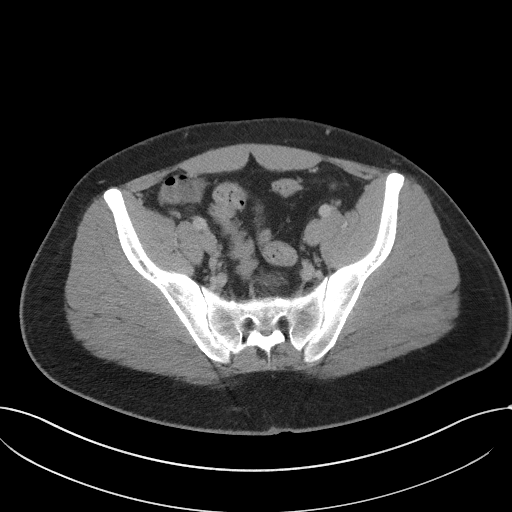
[im 36/108  soft-tissue]
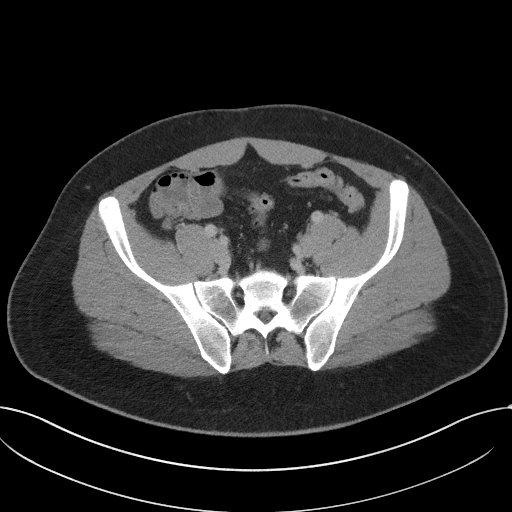
[im 45/108  soft-tissue]
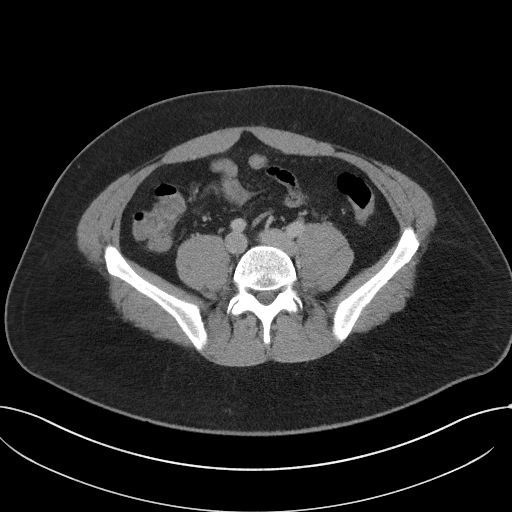
[im 54/108  soft-tissue]
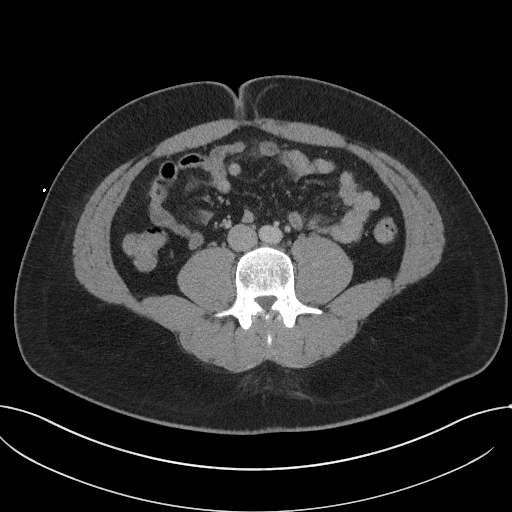
[im 63/108  soft-tissue]
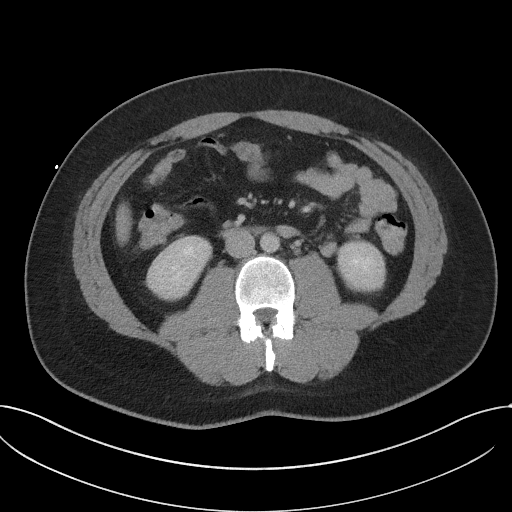
[im 72/108  soft-tissue]
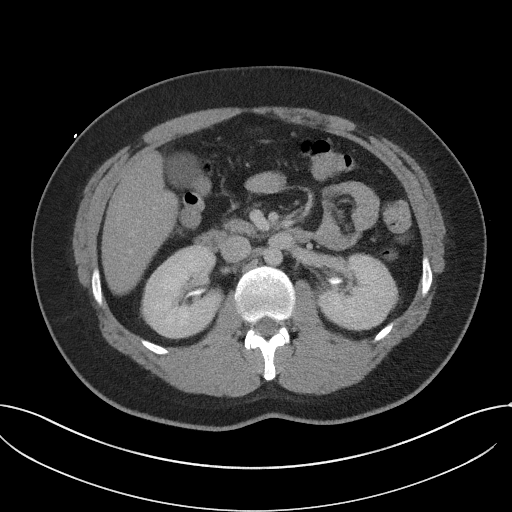
[im 72/108  bone]
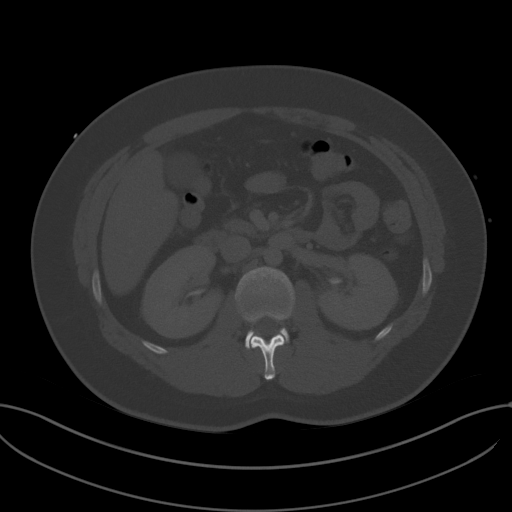
[im 76/108  soft-tissue]
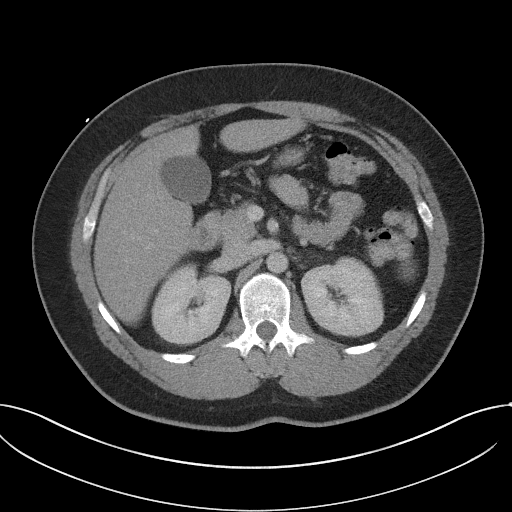
[im 85/108  soft-tissue]
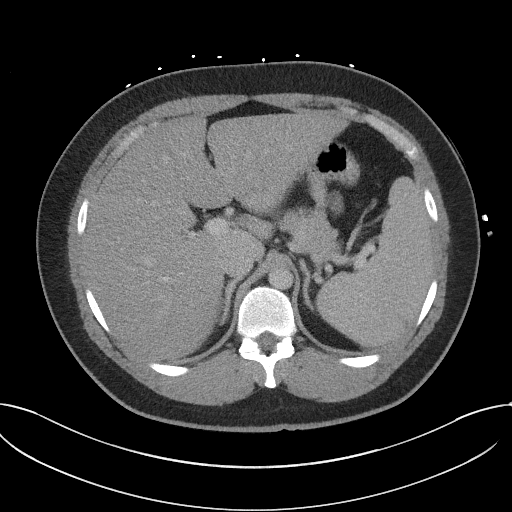
[im 94/108  soft-tissue]
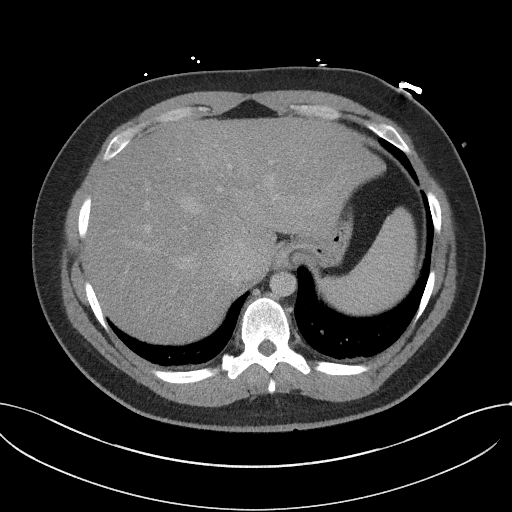
[im 103/108  soft-tissue]
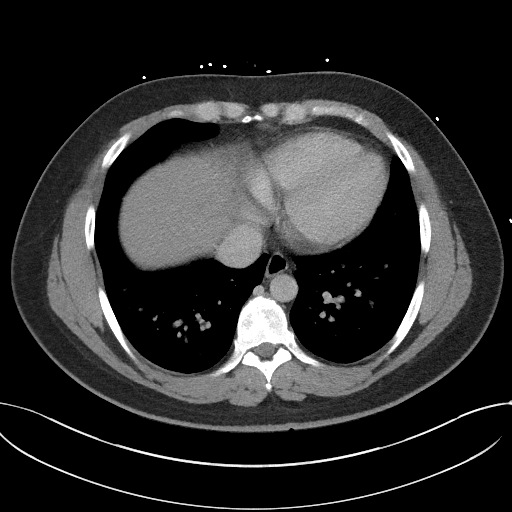

[Series 5: coronal st · coronal · 0.77mm/px · 3 of 103 slices shown]
[im 35/103  soft-tissue]
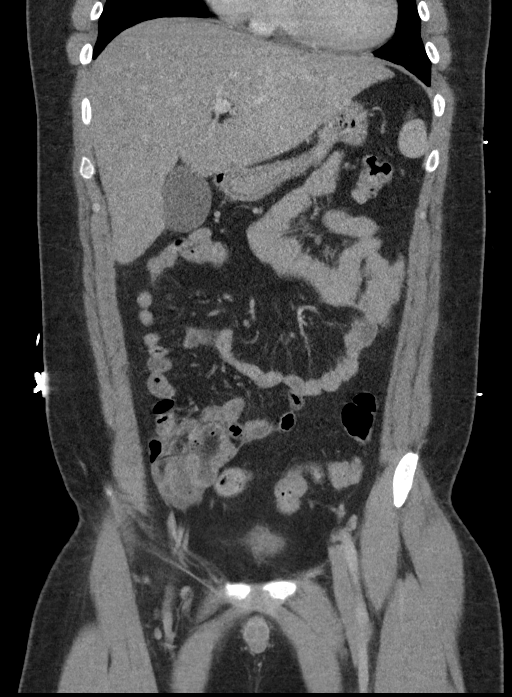
[im 46/103  soft-tissue]
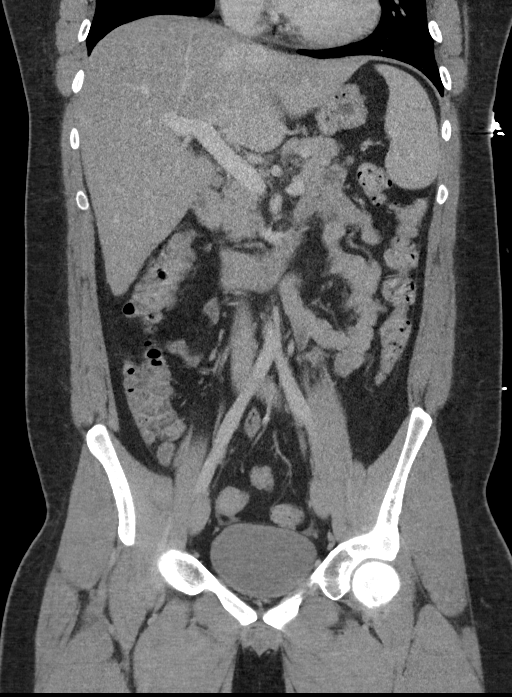
[im 57/103  soft-tissue]
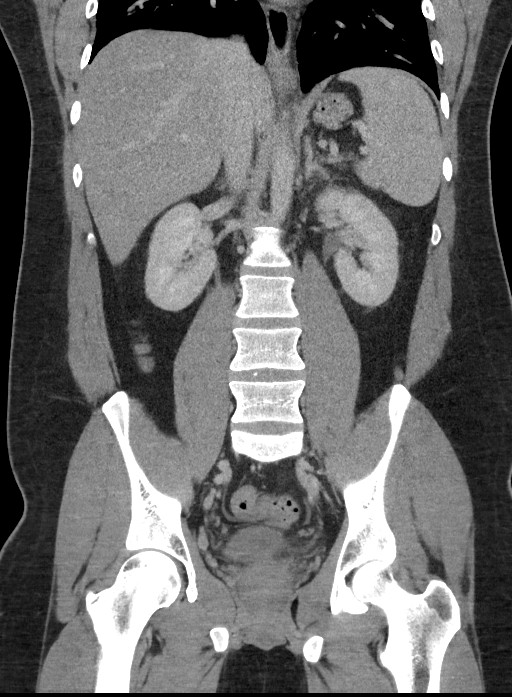

[16 of 46 positions shown; findings below may reference images not displayed]

FINDINGS: Lower chest: No acute abnormality. Scattered small pulmonary nodules
in the dependent bilateral lung bases with some evidence of septal
thickening. Trace bilateral pleural effusions.

Hepatobiliary: No solid liver abnormality is seen. Hepatic
steatosis. No gallstones, gallbladder wall thickening, or biliary
dilatation.

Pancreas: Unremarkable. No pancreatic ductal dilatation or
surrounding inflammatory changes.

Spleen: Normal in size without significant abnormality.

Adrenals/Urinary Tract: Adrenal glands are unremarkable. Kidneys are
normal, without renal calculi, solid lesion, or hydronephrosis.
Bladder is unremarkable.

Stomach/Bowel: Stomach is within normal limits. Appendix appears
normal. No evidence of bowel wall thickening, distention, or
inflammatory changes.

Vascular/Lymphatic: No significant vascular findings are present. No
enlarged abdominal or pelvic lymph nodes.

Reproductive: No mass or other significant abnormality.

Other: No abdominal wall hernia or abnormality. Trace ascites in the
low abdomen (series 2, image 82).

Musculoskeletal: No acute or significant osseous findings.
IMPRESSION: 1. No definite CT findings of the abdomen or pelvis to explain
symptoms or pain.

2. Trace ascites in the low abdomen, of uncertain etiology without
obvious inflammatory findings.

3.  Hepatic steatosis.

4. Scattered small pulmonary nodules in the dependent bilateral lung
bases with some evidence of septal thickening. Trace bilateral
pleural effusions. Findings favor atypical infection and a possible
component of pulmonary edema.

## 2019-07-06 MED ORDER — ACETAMINOPHEN 325 MG PO TABS
650.0000 mg | ORAL_TABLET | Freq: Once | ORAL | Status: AC
  Administered 2019-07-06: 650 mg via ORAL
  Filled 2019-07-06: qty 2

## 2019-07-06 MED ORDER — ONDANSETRON HCL 4 MG/2ML IJ SOLN
4.0000 mg | Freq: Once | INTRAMUSCULAR | Status: AC
  Administered 2019-07-06: 4 mg via INTRAVENOUS

## 2019-07-06 MED ORDER — POTASSIUM CHLORIDE 10 MEQ/100ML IV SOLN
10.0000 meq | INTRAVENOUS | Status: AC
  Administered 2019-07-06 (×2): 10 meq via INTRAVENOUS
  Filled 2019-07-06 (×3): qty 100

## 2019-07-06 MED ORDER — SODIUM CHLORIDE 0.9 % IV BOLUS (SEPSIS): 1000.0000 mL | Freq: Once | INTRAVENOUS | Status: DC

## 2019-07-06 MED ORDER — IOHEXOL 300 MG/ML  SOLN
100.0000 mL | Freq: Once | INTRAMUSCULAR | Status: AC
  Administered 2019-07-06: 100 mL via INTRAVENOUS

## 2019-07-06 MED ORDER — VANCOMYCIN HCL 10 G IV SOLR
1750.0000 mg | Freq: Three times a day (TID) | INTRAVENOUS | Status: DC
  Filled 2019-07-06: qty 1750

## 2019-07-06 MED ORDER — VANCOMYCIN HCL 10 G IV SOLR
1500.0000 mg | Freq: Three times a day (TID) | INTRAVENOUS | Status: DC
  Administered 2019-07-07: 1500 mg via INTRAVENOUS
  Filled 2019-07-06 (×3): qty 1500

## 2019-07-06 MED ORDER — VANCOMYCIN HCL IN DEXTROSE 1-5 GM/200ML-% IV SOLN
1000.0000 mg | Freq: Once | INTRAVENOUS | Status: DC
  Filled 2019-07-06: qty 200

## 2019-07-06 MED ORDER — VANCOMYCIN HCL IN DEXTROSE 1-5 GM/200ML-% IV SOLN
1000.0000 mg | INTRAVENOUS | Status: AC
  Administered 2019-07-06 (×2): 1000 mg via INTRAVENOUS
  Filled 2019-07-06: qty 200

## 2019-07-06 MED ORDER — SODIUM CHLORIDE 0.9 % IV SOLN
2.0000 g | Freq: Three times a day (TID) | INTRAVENOUS | Status: DC
  Administered 2019-07-07 (×2): 2 g via INTRAVENOUS
  Filled 2019-07-06 (×2): qty 2

## 2019-07-06 MED ORDER — SODIUM CHLORIDE 0.9 % IV BOLUS (SEPSIS)
1000.0000 mL | Freq: Once | INTRAVENOUS | Status: AC
  Administered 2019-07-06: 1000 mL via INTRAVENOUS

## 2019-07-06 MED ORDER — LORAZEPAM 2 MG/ML IJ SOLN
1.0000 mg | Freq: Once | INTRAMUSCULAR | Status: AC
  Administered 2019-07-06: 1 mg via INTRAVENOUS
  Filled 2019-07-06: qty 1

## 2019-07-06 MED ORDER — LIDOCAINE HCL 1 % IJ SOLN
INTRAMUSCULAR | Status: AC
  Filled 2019-07-06: qty 20

## 2019-07-06 MED ORDER — ONDANSETRON HCL 4 MG/2ML IJ SOLN
INTRAMUSCULAR | Status: AC
  Filled 2019-07-06: qty 2

## 2019-07-06 MED ORDER — ACETAMINOPHEN 325 MG PO TABS
325.0000 mg | ORAL_TABLET | Freq: Once | ORAL | Status: AC
  Administered 2019-07-06: 325 mg via ORAL
  Filled 2019-07-06: qty 1

## 2019-07-06 MED ORDER — METRONIDAZOLE IN NACL 5-0.79 MG/ML-% IV SOLN
500.0000 mg | Freq: Once | INTRAVENOUS | Status: AC
  Administered 2019-07-06: 500 mg via INTRAVENOUS
  Filled 2019-07-06: qty 100

## 2019-07-06 MED ORDER — SODIUM CHLORIDE 0.9 % IV BOLUS (SEPSIS)
500.0000 mL | Freq: Once | INTRAVENOUS | Status: AC
  Administered 2019-07-06: 500 mL via INTRAVENOUS

## 2019-07-06 MED ORDER — SODIUM CHLORIDE 0.9 % IV BOLUS
1000.0000 mL | Freq: Once | INTRAVENOUS | Status: AC
  Administered 2019-07-06: 1000 mL via INTRAVENOUS

## 2019-07-06 MED ORDER — DEXTROSE 5 % IV SOLN
750.0000 mg | Freq: Three times a day (TID) | INTRAVENOUS | Status: DC
  Administered 2019-07-07 (×2): 750 mg via INTRAVENOUS
  Filled 2019-07-06 (×4): qty 15

## 2019-07-06 MED ORDER — SODIUM CHLORIDE 0.9 % IV SOLN
2.0000 g | Freq: Once | INTRAVENOUS | Status: AC
  Administered 2019-07-06: 2 g via INTRAVENOUS
  Filled 2019-07-06: qty 2

## 2019-07-06 NOTE — ED Provider Notes (Signed)
[facility] EMERGENCY DEPARTMENT Provider Note   CSN: [id number] Arrival date & time: 07/06/19  1502     History   Chief Complaint Chief Complaint  Patient presents with   Fever   Headache    HPI Robie Mcniel is a 28 y.o. male with no significant PMH who presents with fever, headache, dehydration. His significant other is at bedside and helps provide history. He started to feel ill on Saturday. He's had a fever and a global headache with body aches. He's been alternating Tylenol and Ibuprofen. Over the past 24 hours he's been acutely worsening. He hasn't had anything to eat or drink in 24 hours and urine is dark. He is also SOB, weak, fatigued. Today he had a fever of 105 and some confusion. He had an episode of vomiting and his nose was bleeding. He also has a new rash starting today which is diffuse but worse on the back. He has not had any sick contacts but his wife states she had a low grade fever and body aches and their kids did too but they are not as sick as him. The patient denies cough, nausea, diarrhea, urinary symptoms. No recent travel. He has been on vacation from work for the past week and is supposed to start a new job next week. Had a flu vaccine in September     HPI  Past Medical History:  Diagnosis Date   Hyperlipidemia     Patient Active Problem List   Diagnosis Date Noted   Chronic heel pain, left 05/20/2018   Hypertriglyceridemia 06/30/2016   Ringworm 06/29/2016    History reviewed. No pertinent surgical history.      Home Medications    Prior to Admission medications   Medication Sig Start Date End Date Taking? Authorizing Provider  cyclobenzaprine (FLEXERIL) 10 MG tablet Take 1 tablet (10 mg total) by mouth 3 (three) times daily as needed for muscle spasms. 05/25/19   Breeback, Jade L, PA-C  diclofenac sodium (VOLTAREN) 1 % GEL Apply 4 g topically 4 (four) times daily. To affected joint. 05/25/19   Breeback, Royetta Car, PA-C  meloxicam  (MOBIC) 15 MG tablet Take 1 tablet (15 mg total) by mouth daily. 05/25/19   Donella Stade, PA-C    Family History Family History  Problem Relation Age of Onset   Diabetes Mother    Heart attack Maternal Uncle    Cancer Maternal Grandmother    Cancer Maternal Grandfather    Cancer Paternal Grandmother    Cancer Paternal Grandfather     Social History Social History   Tobacco Use   Smoking status: Former Smoker    Quit date: 10/10/2011    Years since quitting: 7.7   Smokeless tobacco: Never Used  Substance Use Topics   Alcohol use: Yes   Drug use: No     Allergies   Penicillins   Review of Systems Review of Systems  Constitutional: Positive for activity change, appetite change, fatigue and fever.  HENT: Positive for nosebleeds and sore throat. Negative for congestion.   Respiratory: Positive for shortness of breath. Negative for cough.   Cardiovascular: Positive for chest pain.  Gastrointestinal: Positive for abdominal pain, nausea and vomiting. Negative for diarrhea.  Genitourinary: Positive for decreased urine volume. Negative for dysuria.  Musculoskeletal: Positive for myalgias.  Skin: Positive for rash.  Neurological: Positive for headaches. Negative for syncope.  Psychiatric/Behavioral: Positive for confusion.  All other systems reviewed and are negative.    Physical Exam  Updated Vital Signs BP 110/64    Pulse (!) 104    Temp (!) 102.8 F (39.3 C) (Rectal)    Resp (!) 24    Ht 5\' 11"  (1.803 m)    Wt 106.6 kg    SpO2 98%    BMI 32.78 kg/m   Physical Exam Vitals signs and nursing note reviewed.  Constitutional:      General: He is not in acute distress.    Appearance: He is well-developed. He is obese. He is ill-appearing.     Comments: Cooperative.  HENT:     Head: Normocephalic and atraumatic.     Mouth/Throat:     Mouth: Mucous membranes are dry.  Eyes:     General: No scleral icterus.       Right eye: No discharge.        Left eye: No  discharge.     Conjunctiva/sclera: Conjunctivae normal.     Pupils: Pupils are equal, round, and reactive to light.  Neck:     Musculoskeletal: Normal range of motion and neck supple. No neck rigidity.  Cardiovascular:     Rate and Rhythm: Tachycardia present.  Pulmonary:     Effort: Pulmonary effort is normal. Tachypnea present. No respiratory distress.     Breath sounds: Normal breath sounds.  Abdominal:     General: There is no distension.     Palpations: Abdomen is soft.     Tenderness: There is no abdominal tenderness.  Skin:    General: Skin is warm and dry.     Findings: Rash (Diffuse macular papular rash) present.  Neurological:     Mental Status: He is alert and oriented to person, place, and time.     GCS: GCS eye subscore is 4. GCS verbal subscore is 5. GCS motor subscore is 6.  Psychiatric:        Mood and Affect: Mood normal.        Behavior: Behavior normal.      ED Treatments / Results  Labs (all labs ordered are listed, but only abnormal results are displayed) Labs Reviewed  COMPREHENSIVE METABOLIC PANEL - Abnormal; Notable for the following components:      Result Value   Sodium 132 (*)    Potassium 2.9 (*)    Chloride 97 (*)    Glucose, Bld 144 (*)    Calcium 8.7 (*)    AST 45 (*)    ALT 54 (*)    All other components within normal limits  LACTIC ACID, PLASMA - Abnormal; Notable for the following components:   Lactic Acid, Venous 3.3 (*)    All other components within normal limits  CBC WITH DIFFERENTIAL/PLATELET - Abnormal; Notable for the following components:   WBC 28.7 (*)    Neutro Abs 25.2 (*)    Monocytes Absolute 1.7 (*)    Abs Immature Granulocytes 0.43 (*)    All other components within normal limits  PROTIME-INR - Abnormal; Notable for the following components:   Prothrombin Time 17.2 (*)    INR 1.4 (*)    All other components within normal limits  URINALYSIS, ROUTINE W REFLEX MICROSCOPIC - Abnormal; Notable for the following  components:   APPearance HAZY (*)    Hgb urine dipstick MODERATE (*)    Protein, ur 100 (*)    All other components within normal limits  URINALYSIS, MICROSCOPIC (REFLEX) - Abnormal; Notable for the following components:   Bacteria, UA RARE (*)    All other components  within normal limits  CULTURE, BLOOD (ROUTINE X 2)  CULTURE, BLOOD (ROUTINE X 2)  SARS CORONAVIRUS 2 (TAT 6-24 HRS)  MAGNESIUM  LACTIC ACID, PLASMA  RPR    EKG EKG Interpretation  Date/Time:  Wednesday July 06 2019 15:33:44 EST Ventricular Rate:  96 PR Interval:    QRS Duration: 91 QT Interval:  352 QTC Calculation: 445 R Axis:   81 Text Interpretation: Sinus rhythm Baseline wander in lead(s) I III aVL aVF V1 V2 Confirmed by Virgina Norfolk [phone]) on 07/06/2019 3:49:27 PM   Radiology Ct Head Wo Contrast  Result Date: 07/06/2019 CLINICAL DATA:  Altered mental status EXAM: CT HEAD WITHOUT CONTRAST TECHNIQUE: Contiguous axial images were obtained from the base of the skull through the vertex without intravenous contrast. COMPARISON:  None. FINDINGS: Brain: No evidence of acute infarction, hemorrhage, hydrocephalus, extra-axial collection or mass lesion/mass effect. Vascular: No hyperdense vessel or unexpected calcification. Skull: Normal. Negative for fracture or focal lesion. Sinuses/Orbits: No acute finding. Other: None. IMPRESSION: No acute intracranial pathology. Electronically Signed   By: Lauralyn Primes M.D.   On: 07/06/2019 17:28   Ct Abdomen Pelvis W Contrast  Result Date: 07/06/2019 CLINICAL DATA:  Fever, headache, nausea, vomiting, abdominal pain EXAM: CT ABDOMEN AND PELVIS WITH CONTRAST TECHNIQUE: Multidetector CT imaging of the abdomen and pelvis was performed using the standard protocol following bolus administration of intravenous contrast. CONTRAST:  OMNIPAQUE IOHEXOL 300 MG/ML  SOLN COMPARISON:  None. FINDINGS: Lower chest: No acute abnormality. Scattered small pulmonary nodules in the  dependent bilateral lung bases with some evidence of septal thickening. Trace bilateral pleural effusions. Hepatobiliary: No solid liver abnormality is seen. Hepatic steatosis. No gallstones, gallbladder wall thickening, or biliary dilatation. Pancreas: Unremarkable. No pancreatic ductal dilatation or surrounding inflammatory changes. Spleen: Normal in size without significant abnormality. Adrenals/Urinary Tract: Adrenal glands are unremarkable. Kidneys are normal, without renal calculi, solid lesion, or hydronephrosis. Bladder is unremarkable. Stomach/Bowel: Stomach is within normal limits. Appendix appears normal. No evidence of bowel wall thickening, distention, or inflammatory changes. Vascular/Lymphatic: No significant vascular findings are present. No enlarged abdominal or pelvic lymph nodes. Reproductive: No mass or other significant abnormality. Other: No abdominal wall hernia or abnormality. Trace ascites in the low abdomen (series 2, image 82). Musculoskeletal: No acute or significant osseous findings. IMPRESSION: 1. No definite CT findings of the abdomen or pelvis to explain symptoms or pain. 2. Trace ascites in the low abdomen, of uncertain etiology without obvious inflammatory findings. 3.  Hepatic steatosis. 4. Scattered small pulmonary nodules in the dependent bilateral lung bases with some evidence of septal thickening. Trace bilateral pleural effusions. Findings favor atypical infection and a possible component of pulmonary edema. Electronically Signed   By: Lauralyn Primes M.D.   On: 07/06/2019 17:27   Dg Chest Port 1 View  Result Date: 07/06/2019 CLINICAL DATA:  Fever with headache, nausea and vomiting 4 days. EXAM: PORTABLE CHEST 1 VIEW COMPARISON:  None. FINDINGS: Lungs are adequately inflated and otherwise clear. Cardiomediastinal silhouette and remainder of the exam is within normal. IMPRESSION: No active disease. Electronically Signed   By: Elberta Fortis M.D.   On: 07/06/2019 15:52     Procedures Procedures (including critical care time)  CRITICAL CARE Performed by: Bethel Born   Total critical care time: 35 minutes  Critical care time was exclusive of separately billable procedures and treating other patients.  Critical care was necessary to treat or prevent imminent or life-threatening deterioration.  Critical care was time spent personally  by me on the following activities: development of treatment plan with patient and/or surrogate as well as nursing, discussions with consultants, evaluation of patient's response to treatment, examination of patient, obtaining history from patient or surrogate, ordering and performing treatments and interventions, ordering and review of laboratory studies, ordering and review of radiographic studies, pulse oximetry and re-evaluation of patient's condition.   Medications Ordered in ED Medications  metroNIDAZOLE (FLAGYL) IVPB 500 mg (has no administration in time range)  vancomycin (VANCOCIN) IVPB 1000 mg/200 mL premix (1,000 mg Intravenous New Bag/Given 07/06/19 1728)  vancomycin (VANCOCIN) 1,750 mg in sodium chloride 0.9 % 500 mL IVPB (has no administration in time range)  ceFEPIme (MAXIPIME) 2 g in sodium chloride 0.9 % 100 mL IVPB (has no administration in time range)  potassium chloride 10 mEq in 100 mL IVPB (10 mEq Intravenous New Bag/Given 07/06/19 1656)  sodium chloride 0.9 % bolus 1,000 mL (0 mLs Intravenous Stopped 07/06/19 1728)  ceFEPIme (MAXIPIME) 2 g in sodium chloride 0.9 % 100 mL IVPB (0 g Intravenous Stopped 07/06/19 1644)  sodium chloride 0.9 % bolus 1,000 mL (1,000 mLs Intravenous New Bag/Given 07/06/19 1658)    And  sodium chloride 0.9 % bolus 1,000 mL (1,000 mLs Intravenous New Bag/Given 07/06/19 1555)    And  sodium chloride 0.9 % bolus 500 mL (0 mLs Intravenous Stopped 07/06/19 1614)  ondansetron (ZOFRAN) injection 4 mg (4 mg Intravenous Given 07/06/19 1633)  iohexol (OMNIPAQUE) 300 MG/ML solution  100 mL (100 mLs Intravenous Contrast Given 07/06/19 1700)     Initial Impression / Assessment and Plan / ED Course  I have reviewed the triage vital signs and the nursing notes.  Pertinent labs & imaging results that were available during my care of the patient were reviewed by me and considered in my medical decision making (see chart for details).  28 year old male with fever, headache, body aches. Unclear etiology as symptoms are non-focal. He is febrile to 102.8. He is mildly tachycardic and is tachypenic. O2 is 99% and he is normotensive. On exam he is alert and cooperative. Wife reports confusion which is mild in nature. There is no meningismus. He has a diffuse macular-papular rash. Heart rate is fast and regular. Lungs are CTA. Abdomen is soft and mildly generally tender. Will order labs, UA, CXR, COVID.  CBC is remarkable for significant leukocytosis of 28. Code sepsis initiated and broad spectrum abx and 30cc/kg fluid bolus ordered. CMP is remarkable for mild hyponatremia (132), hypokalemia (2.9), hypocalcemia (8.7), mildly elevated LFTs. Mag is normal. Lactate is 3.3. CXR is negative. Shared visit with Dr. Lockie Molauratolo. Will order CT head and CT abdomen/pelvis  CT head is negative. CT abdomen/pelvis shows small volume ascites, fatty liver, small pulmonary nodules in the dependent lung bases with septal thickening and trace bilateral pleural effusions. Since CXR was negative will obtain dedicated CT chest to further evaluate. UA is negative for UTI. Urine culture sent  Care signed out to Dr. Lockie Molauratolo at shift change  Final Clinical Impressions(s) / ED Diagnoses   Final diagnoses:  Fever    ED Discharge Orders    None       Bethel BornGekas, Kailah Pennel Marie, PA-C 07/06/19 1832    Virgina Norfolkuratolo, Adam, DO 07/06/19 2307

## 2019-07-06 NOTE — Plan of Care (Signed)
28 yo M prior healthy came in with 5 day  fever headache, dehydration fever of 105 and some confusion There is no meningismus episode of vomiting and his nose was bleeding has a new rash starting today which is diffuse but worse on the back. Rash (Diffuse macular papular rash) present Sodium 132 (*)    Potassium 2.9 (*)   Lactic Acid, Venous 3.3 (*)    WBC 28.7 (*)    Neutro Abs 25.2 (*)    Code sepsis called started on flagyl, vanc, cefepime Kcl 10 mEQ  CXR is negative. CT head is negative.  CT abdomen/pelvis shows small volume ascites, fatty liver, small pulmonary nodules in the dependent lung bases with septal thickening and trace bilateral pleural effusions. UA is negative for UTI. Urine culture sent Therefore CT scan of the chest was obtained which showed bilateral groundglass opacities consistent with an atypical infection or other inflammatory process. Covid test is still pending.  LP done COVID-19 Labs  No results for input(s): DDIMER, FERRITIN, LDH, CRP in the last 72 hours.  No results found for: [place] back but ER provider performing  LP Will call back at a later time 8:51 PM  LP was attempted but unsuccessful Rec: RVP, covid markers Bp stable Will need LP done by IR

## 2019-07-06 NOTE — ED Notes (Signed)
ED Provider at bedside. 

## 2019-07-06 NOTE — Progress Notes (Signed)
Approved pended patient "Raymond Owens" to [facility] ICU/SD unit, room #: 9233.  Shae Nesmith,RN will be patient's receiving nurse.  Report can be called to her at [phone].  Jacqulyn Ducking ICU/SD RN4 / Care Coordinator / Rapid Response Nurse Rapid Response Number:  [phone] ICU Charge Nurse Number:  [phone]

## 2019-07-06 NOTE — Progress Notes (Signed)
Pharmacy Antibiotic Note  Raymond Owens is a 28 y.o. male admitted on 07/06/2019 with sepsis.  Pharmacy has been consulted for cefepime/vancomycin  dosing.  Plan: Cefepime 2 grams IV every 8 hours  Vancomycin 1750 mg IV every 8 hours after 2 gram initial dose  Height: 5\' 11"  (180.3 cm) Weight: 235 lb (106.6 kg) IBW/kg (Calculated) : 75.3  Temp (24hrs), Avg:100.7 F (38.2 C), Min:98.6 F (37 C), Max:102.8 F (39.3 C)  Recent Labs  Lab 07/06/19 1524 07/06/19 1525  WBC 28.7*  --   CREATININE 0.86  --   LATICACIDVEN  --  3.3*    Estimated Creatinine Clearance: 160.2 mL/min (by C-G formula based on SCr of 0.86 mg/dL).    Allergies  Allergen Reactions  . Penicillins Rash    Antimicrobials this admission: Vancomycin 1 gram every 1 hr x 2 doses (2 grams)  Cefepime 2grams once   Microbiology results: Pending    Thank you for allowing pharmacy to be a part of this patient's care.  Mallie Mussel A Tyler Robidoux 07/06/2019 4:11 PM

## 2019-07-06 NOTE — ED Provider Notes (Addendum)
Medical screening examination/treatment/procedure(s) were conducted as a shared visit with non-physician practitioner(s) and myself.  I personally evaluated the patient during the encounter. Briefly, the patient is a 28 y.o. male with no significant medical history presents to the ED with fever, headache, body aches, concern for dehydration.  Patient started to feel sick about 5 days ago with fever, headaches.  Has progressed to body aches.  Denies any cough or sputum production.  States that it hurts to move his body including his legs.  His neck does hurt when he moves his head but no signs of clear meningeal signs.  Wife states that he has been mildly confused.  Nobody else is sick at home but children and wife have developed some mild body aches since his symptoms have begun.  No known coronavirus exposure.  Has not had coronavirus.  Appears to have normal neurological exam.  He does have a little macular papular rash on his torso and legs but very mild.  No recent travel.  Denies any IV drug use.  Denies any camping or hikes.  Overall patient is febrile, tachycardic, tachypneic.  Sepsis work-up was initiated.  We will get a CT scan of his head and abdomen and pelvis.  He does have some tenderness on his abdomen diffusely.  Overall appears to have viral process.  However will empirically start IV antibiotics.  Will cover for meningitis.  Will hold on antiviral per pharmacy recommendations at this time.  Low suspicion for bacterial meningitis.  Will initiate work-up and consider an LP.  Patient with a white count of 28.  Lactic acid of 3.3.  Urinalysis negative for infection.  Chest x-ray unremarkable.  CT scan of head unremarkable.  CT scan abdomen pelvis showed some inflammation in the lungs for possible atypical infection.  Therefore CT scan of the chest was obtained which showed bilateral groundglass opacities consistent with an atypical infection or other inflammatory process.  However radiology does note  that this is not classic for coronavirus pneumonia.  Covid test is still pending.  Patient continues to headaches, photophobia.  Will perform LP to further evaluate for infectious process including bacterial/viral menigitis.  However suspect viral pneumonia as a possible source.  Has been covered with antibiotics.  Will discuss with medicine for admission.  2 attempts at LP were unsuccessful.  Admitted to hospitalist.  Will start acyclovir.  Will broaden work-up with further inflammatory markers including procalcitonin, CRP, ESR.  Will add RVP. Will be admitted to [facility].  Hemodynamically stable throughout my care.  .Critical Care Performed by: Lennice Sites, DO Authorized by: Lennice Sites, DO   Critical care provider statement:    Critical care time (minutes):  20   Critical care was necessary to treat or prevent imminent or life-threatening deterioration of the following conditions:  Sepsis   Critical care was time spent personally by me on the following activities:  Blood draw for specimens, development of treatment plan with patient or surrogate, discussions with primary provider, evaluation of patient's response to treatment, examination of patient, obtaining history from patient or surrogate, ordering and performing treatments and interventions, ordering and review of laboratory studies, ordering and review of radiographic studies, re-evaluation of patient's condition, pulse oximetry and review of old charts   I assumed direction of critical care for this patient from another provider in my specialty: no   .Lumbar Puncture  Date/Time: 07/06/2019 9:21 PM Performed by: Lennice Sites, DO Authorized by: Lennice Sites, DO   Consent:  Consent obtained:  Written   Consent given by:  Patient and spouse   Risks discussed:  Bleeding, headache, infection, pain, repeat procedure and nerve damage   Alternatives discussed:  No treatment and observation Pre-procedure details:    Procedure  purpose:  Diagnostic   Preparation: Patient was prepped and draped in usual sterile fashion   Sedation:    Sedation type:  Anxiolysis Anesthesia (see MAR for exact dosages):    Anesthesia method:  Local infiltration   Local anesthetic:  Lidocaine 1% w/o epi Procedure details:    Lumbar space:  L3-L4 interspace   Patient position:  L lateral decubitus   Needle gauge:  22   Needle type:  Spinal needle - Quincke tip   Needle length (in):  3.5   Ultrasound guidance: no     Number of attempts:  2 Post-procedure:    Patient tolerance of procedure:  Tolerated well, no immediate complications Comments:     Unsuccessful attempts     This chart was dictated using voice recognition software.  Despite best efforts to proofread,  errors can occur which can change the documentation meaning.     EKG Interpretation  Date/Time:  Wednesday July 06 2019 15:33:44 EST Ventricular Rate:  96 PR Interval:    QRS Duration: 91 QT Interval:  352 QTC Calculation: 445 R Axis:   81 Text Interpretation: Sinus rhythm Baseline wander in lead(s) I III aVL aVF V1 V2 Confirmed by Lennice Sites [phone]) on 07/06/2019 3:49:27 PM           Lennice Sites, DO 07/06/19 2122    Lennice Sites, DO 07/07/19 1609

## 2019-07-06 NOTE — ED Triage Notes (Signed)
Pt's spouse reports fever , headache , nausea , vomiting, x 4 days, last tylenol 1000 mg at 1 pm today , ibuprofen 400 mg same time, alert and oriented x 4 yet appears extremely lethargic.

## 2019-07-06 NOTE — ED Notes (Signed)
Date and time results received: 07/06/19 2215  Test: lactic acid Critical Value: 2.2  Name of Provider Notified: Dr. Ronnald Nian  Orders Received? Or Actions Taken?: no new orders

## 2019-07-06 NOTE — ED Notes (Signed)
[facility] notified (Kim) - patient ready for transport 

## 2019-07-06 NOTE — ED Notes (Signed)
Pt's O2 sats noted to be in the 80's.  Pt lethargic will wake and take deep breaths.  Pt placed on 2L of O2 via [place] w/ improvement in O2 sat to 94%.

## 2019-07-07 ENCOUNTER — Inpatient Hospital Stay (HOSPITAL_COMMUNITY): Payer: 59

## 2019-07-07 ENCOUNTER — Encounter (HOSPITAL_COMMUNITY): Payer: Self-pay | Admitting: Internal Medicine

## 2019-07-07 DIAGNOSIS — Z833 Family history of diabetes mellitus: Secondary | ICD-10-CM | POA: Diagnosis not present

## 2019-07-07 DIAGNOSIS — K7689 Other specified diseases of liver: Secondary | ICD-10-CM | POA: Diagnosis not present

## 2019-07-07 DIAGNOSIS — R238 Other skin changes: Secondary | ICD-10-CM | POA: Diagnosis present

## 2019-07-07 DIAGNOSIS — R197 Diarrhea, unspecified: Secondary | ICD-10-CM | POA: Diagnosis not present

## 2019-07-07 DIAGNOSIS — G039 Meningitis, unspecified: Secondary | ICD-10-CM

## 2019-07-07 DIAGNOSIS — G009 Bacterial meningitis, unspecified: Secondary | ICD-10-CM | POA: Diagnosis not present

## 2019-07-07 DIAGNOSIS — R519 Headache, unspecified: Secondary | ICD-10-CM | POA: Diagnosis present

## 2019-07-07 DIAGNOSIS — I10 Essential (primary) hypertension: Secondary | ICD-10-CM

## 2019-07-07 DIAGNOSIS — A419 Sepsis, unspecified organism: Secondary | ICD-10-CM | POA: Diagnosis not present

## 2019-07-07 DIAGNOSIS — R5081 Fever presenting with conditions classified elsewhere: Secondary | ICD-10-CM

## 2019-07-07 DIAGNOSIS — R002 Palpitations: Secondary | ICD-10-CM

## 2019-07-07 DIAGNOSIS — G4700 Insomnia, unspecified: Secondary | ICD-10-CM | POA: Diagnosis not present

## 2019-07-07 DIAGNOSIS — J9600 Acute respiratory failure, unspecified whether with hypoxia or hypercapnia: Secondary | ICD-10-CM | POA: Diagnosis not present

## 2019-07-07 DIAGNOSIS — Z880 Allergy status to penicillin: Secondary | ICD-10-CM | POA: Diagnosis not present

## 2019-07-07 DIAGNOSIS — L309 Dermatitis, unspecified: Secondary | ICD-10-CM | POA: Diagnosis not present

## 2019-07-07 DIAGNOSIS — Z20828 Contact with and (suspected) exposure to other viral communicable diseases: Secondary | ICD-10-CM | POA: Diagnosis present

## 2019-07-07 DIAGNOSIS — R918 Other nonspecific abnormal finding of lung field: Secondary | ICD-10-CM | POA: Diagnosis not present

## 2019-07-07 DIAGNOSIS — R21 Rash and other nonspecific skin eruption: Secondary | ICD-10-CM | POA: Diagnosis present

## 2019-07-07 DIAGNOSIS — R509 Fever, unspecified: Secondary | ICD-10-CM | POA: Diagnosis not present

## 2019-07-07 DIAGNOSIS — R609 Edema, unspecified: Secondary | ICD-10-CM | POA: Diagnosis not present

## 2019-07-07 DIAGNOSIS — Z6832 Body mass index (BMI) 32.0-32.9, adult: Secondary | ICD-10-CM | POA: Diagnosis not present

## 2019-07-07 DIAGNOSIS — R7989 Other specified abnormal findings of blood chemistry: Secondary | ICD-10-CM | POA: Diagnosis not present

## 2019-07-07 DIAGNOSIS — E860 Dehydration: Secondary | ICD-10-CM | POA: Diagnosis present

## 2019-07-07 DIAGNOSIS — R000 Tachycardia, unspecified: Secondary | ICD-10-CM | POA: Diagnosis not present

## 2019-07-07 DIAGNOSIS — R945 Abnormal results of liver function studies: Secondary | ICD-10-CM | POA: Diagnosis not present

## 2019-07-07 DIAGNOSIS — R6520 Severe sepsis without septic shock: Secondary | ICD-10-CM | POA: Diagnosis present

## 2019-07-07 DIAGNOSIS — Z87891 Personal history of nicotine dependence: Secondary | ICD-10-CM | POA: Diagnosis not present

## 2019-07-07 DIAGNOSIS — U071 COVID-19: Secondary | ICD-10-CM | POA: Diagnosis not present

## 2019-07-07 DIAGNOSIS — R3129 Other microscopic hematuria: Secondary | ICD-10-CM | POA: Diagnosis present

## 2019-07-07 DIAGNOSIS — R0902 Hypoxemia: Secondary | ICD-10-CM | POA: Diagnosis not present

## 2019-07-07 DIAGNOSIS — R410 Disorientation, unspecified: Secondary | ICD-10-CM | POA: Diagnosis not present

## 2019-07-07 DIAGNOSIS — J9601 Acute respiratory failure with hypoxia: Secondary | ICD-10-CM | POA: Diagnosis not present

## 2019-07-07 DIAGNOSIS — Z8639 Personal history of other endocrine, nutritional and metabolic disease: Secondary | ICD-10-CM | POA: Diagnosis not present

## 2019-07-07 DIAGNOSIS — E669 Obesity, unspecified: Secondary | ICD-10-CM | POA: Diagnosis present

## 2019-07-07 DIAGNOSIS — G030 Nonpyogenic meningitis: Secondary | ICD-10-CM | POA: Diagnosis not present

## 2019-07-07 DIAGNOSIS — Z8249 Family history of ischemic heart disease and other diseases of the circulatory system: Secondary | ICD-10-CM | POA: Diagnosis not present

## 2019-07-07 DIAGNOSIS — E871 Hypo-osmolality and hyponatremia: Secondary | ICD-10-CM | POA: Diagnosis present

## 2019-07-07 DIAGNOSIS — M47816 Spondylosis without myelopathy or radiculopathy, lumbar region: Secondary | ICD-10-CM | POA: Diagnosis not present

## 2019-07-07 DIAGNOSIS — E876 Hypokalemia: Secondary | ICD-10-CM | POA: Diagnosis present

## 2019-07-07 DIAGNOSIS — L308 Other specified dermatitis: Secondary | ICD-10-CM | POA: Diagnosis not present

## 2019-07-07 DIAGNOSIS — Z791 Long term (current) use of non-steroidal anti-inflammatories (NSAID): Secondary | ICD-10-CM | POA: Diagnosis not present

## 2019-07-07 DIAGNOSIS — R0603 Acute respiratory distress: Secondary | ICD-10-CM | POA: Diagnosis not present

## 2019-07-07 LAB — RESPIRATORY PANEL BY PCR

## 2019-07-07 LAB — COMPREHENSIVE METABOLIC PANEL
ALT: 46 U/L — ABNORMAL HIGH (ref 0–44)
AST: 27 U/L (ref 15–41)
Albumin: 3.1 g/dL — ABNORMAL LOW (ref 3.5–5.0)
Alkaline Phosphatase: 88 U/L (ref 38–126)
Anion gap: 12 (ref 5–15)
BUN: 15 mg/dL (ref 6–20)
CO2: 22 mmol/L (ref 22–32)
Calcium: 7.8 mg/dL — ABNORMAL LOW (ref 8.9–10.3)
Chloride: 100 mmol/L (ref 98–111)
Creatinine, Ser: 0.89 mg/dL (ref 0.61–1.24)
GFR calc Af Amer: >60 mL/min (ref >60)
GFR calc non Af Amer: >60 mL/min (ref >60)
Glucose, Bld: 126 mg/dL — ABNORMAL HIGH (ref 70–99)
Potassium: 3.1 mmol/L — ABNORMAL LOW (ref 3.5–5.1)
Sodium: 134 mmol/L — ABNORMAL LOW (ref 135–145)
Total Bilirubin: 1.3 mg/dL — ABNORMAL HIGH (ref 0.3–1.2)
Total Protein: 6.8 g/dL (ref 6.5–8.1)

## 2019-07-07 LAB — BLOOD GAS, ARTERIAL
Acid-Base Excess: 2.4 mmol/L — ABNORMAL HIGH (ref 0.0–2.0)
Bicarbonate: 24.4 mmol/L (ref 20.0–28.0)
Drawn by: 257701
O2 Saturation: 94 %
Patient temperature: 100
pCO2 arterial: 31.8 mmHg — ABNORMAL LOW (ref 32.0–48.0)
pH, Arterial: 7.501 — ABNORMAL HIGH (ref 7.350–7.450)
pO2, Arterial: 70.2 mmHg — ABNORMAL LOW (ref 83.0–108.0)

## 2019-07-07 LAB — PROCALCITONIN: Procalcitonin: 3.33 ng/mL

## 2019-07-07 LAB — CBC
HCT: 39 % (ref 39.0–52.0)
Hemoglobin: 13.1 g/dL (ref 13.0–17.0)
MCH: 30 pg (ref 26.0–34.0)
MCHC: 33.6 g/dL (ref 30.0–36.0)
MCV: 89.4 fL (ref 80.0–100.0)
Platelets: 240 10*3/uL (ref 150–400)
RBC: 4.36 MIL/uL (ref 4.22–5.81)
RDW: 13.6 % (ref 11.5–15.5)
WBC: 26.7 10*3/uL — ABNORMAL HIGH (ref 4.0–10.5)
nRBC: 0 % (ref 0.0–0.2)

## 2019-07-07 LAB — SEDIMENTATION RATE: Sed Rate: 77 mm/hr — ABNORMAL HIGH (ref 0–16)

## 2019-07-07 LAB — TSH: TSH: 0.682 u[IU]/mL (ref 0.350–4.500)

## 2019-07-07 LAB — TRIGLYCERIDES: Triglycerides: 80 mg/dL

## 2019-07-07 LAB — C-REACTIVE PROTEIN: CRP: 27.3 mg/dL — ABNORMAL HIGH (ref <1.0)

## 2019-07-07 LAB — ECHOCARDIOGRAM COMPLETE
Height: 71 in
Weight: 3915.37 oz

## 2019-07-07 LAB — HIV ANTIBODY (ROUTINE TESTING W REFLEX): HIV Screen 4th Generation wRfx: NONREACTIVE

## 2019-07-07 LAB — HEPATITIS PANEL, ACUTE
HCV Ab: NONREACTIVE
Hep A IgM: NONREACTIVE
Hep B C IgM: NONREACTIVE
Hepatitis B Surface Ag: NONREACTIVE

## 2019-07-07 LAB — CSF CELL COUNT WITH DIFFERENTIAL
Lymphs, CSF: 13 % — ABNORMAL LOW (ref 40–80)
Monocyte-Macrophage-Spinal Fluid: 5 % — ABNORMAL LOW (ref 15–45)
RBC Count, CSF: 43 /mm3 — ABNORMAL HIGH
Segmented Neutrophils-CSF: 82 % — ABNORMAL HIGH (ref 0–6)
Tube #: 4
WBC, CSF: 460 /mm3 (ref 0–5)

## 2019-07-07 LAB — CRYPTOCOCCAL ANTIGEN: Crypto Ag: NEGATIVE

## 2019-07-07 LAB — GLUCOSE, CAPILLARY: Glucose-Capillary: 89 mg/dL (ref 70–99)

## 2019-07-07 LAB — CK TOTAL AND CKMB (NOT AT ARMC)
CK, MB: 2.7 ng/mL (ref 0.5–5.0)
Relative Index: INVALID (ref 0.0–2.5)
Total CK: 89 U/L (ref 49–397)

## 2019-07-07 LAB — PROTEIN, CSF: Total  Protein, CSF: 61 mg/dL — ABNORMAL HIGH (ref 15–45)

## 2019-07-07 LAB — LACTATE DEHYDROGENASE: LDH: 272 U/L — ABNORMAL HIGH (ref 98–192)

## 2019-07-07 LAB — MRSA PCR SCREENING: MRSA by PCR: NEGATIVE

## 2019-07-07 LAB — FERRITIN: Ferritin: 494 ng/mL — ABNORMAL HIGH (ref 24–336)

## 2019-07-07 LAB — LACTIC ACID, PLASMA: Lactic Acid, Venous: 1.7 mmol/L (ref 0.5–1.9)

## 2019-07-07 LAB — GLUCOSE, CSF: Glucose, CSF: 52 mg/dL (ref 40–70)

## 2019-07-07 LAB — FIBRINOGEN: Fibrinogen: >800 mg/dL — ABNORMAL HIGH (ref 210–475)

## 2019-07-07 LAB — RPR: RPR Ser Ql: NONREACTIVE

## 2019-07-07 LAB — SARS CORONAVIRUS 2 (TAT 6-24 HRS): SARS Coronavirus 2: NEGATIVE

## 2019-07-07 IMAGING — CR DG SPINAL PUNCT LUMBAR DIAG WITH FL CT GUIDANCE
1 series · 1 of 1 positions shown · non-contrast
Comparison: none

CLINICAL DATA: Fever.

[x lumbar spine lat]
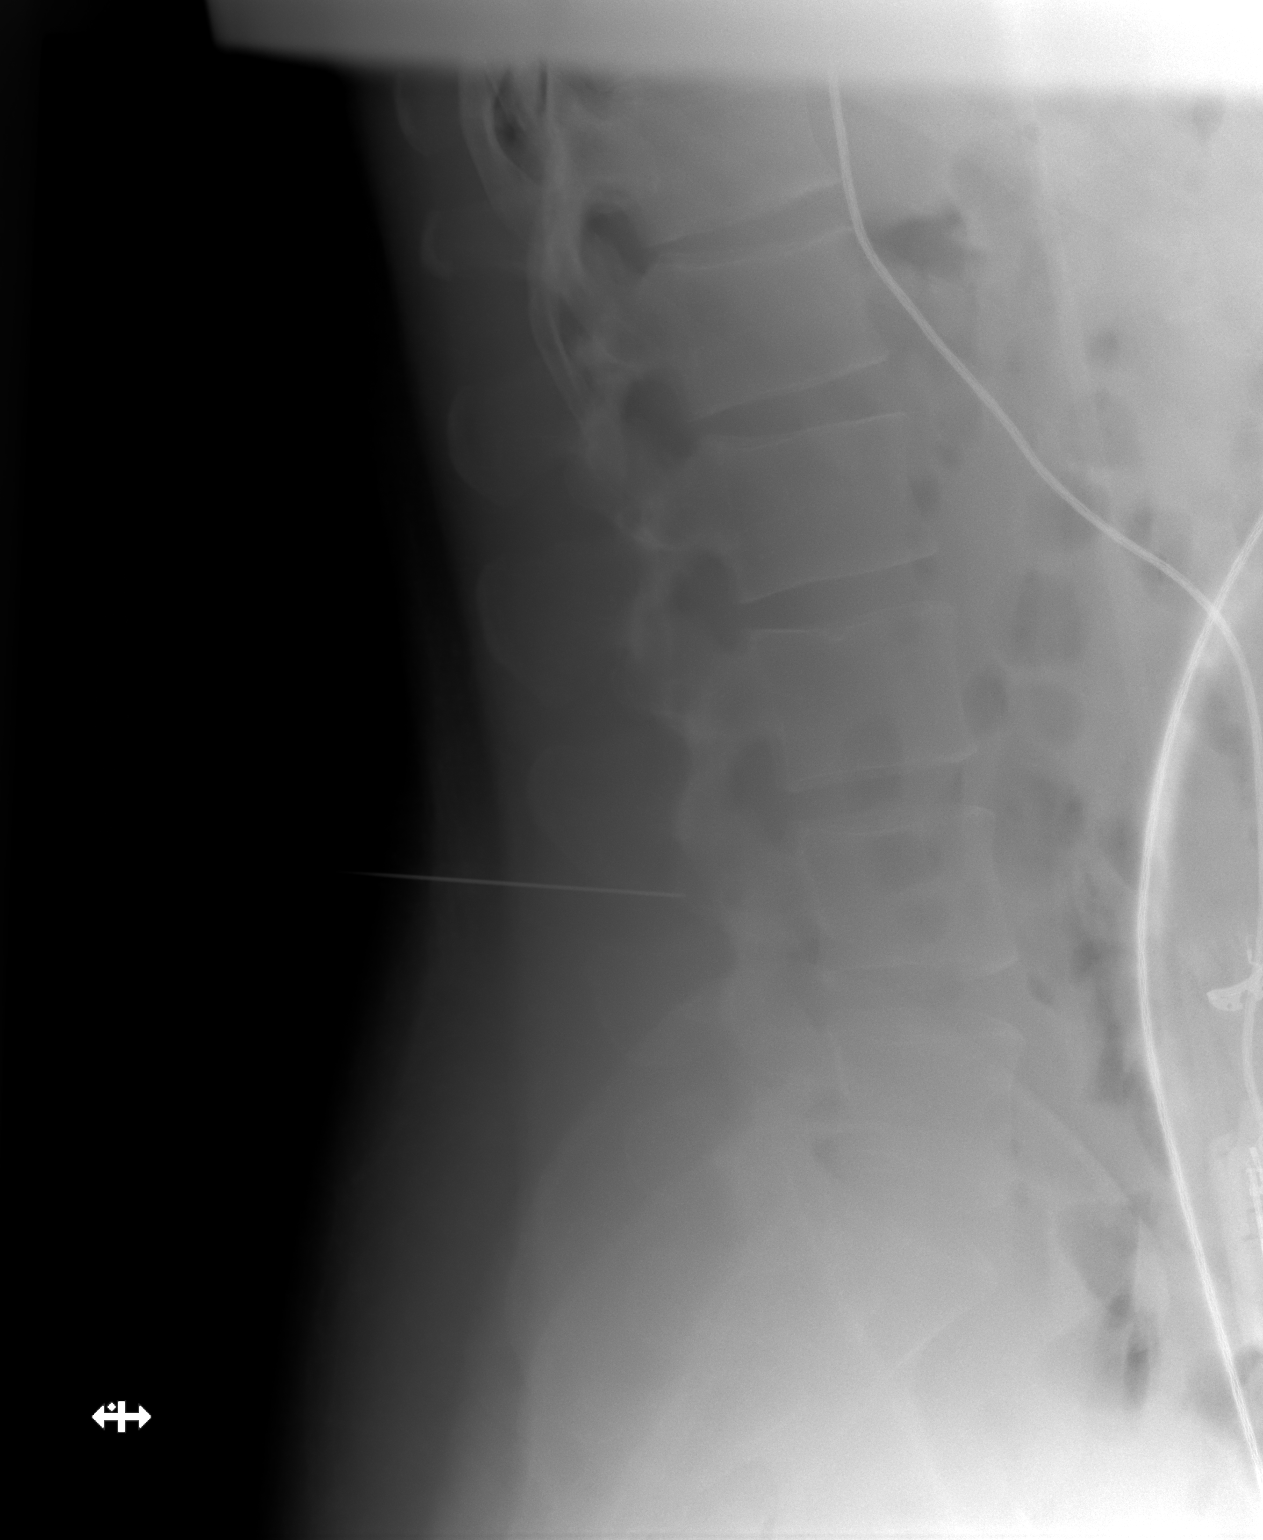

[1 of 1 positions shown; findings below may reference images not displayed]

EXAM:
DIAGNOSTIC LUMBAR PUNCTURE UNDER FLUOROSCOPIC GUIDANCE

FLUOROSCOPY TIME:  Fluoroscopy Time:  0.3 minutes

Radiation Exposure Index (if provided by the fluoroscopic device):
24.80 mGy

Number of Acquired Spot Images: 1

PROCEDURE:
Informed consent was obtained from the patient prior to the
procedure, including potential complications of headache, allergy,
and pain. With the patient prone, the lower back was prepped with
Betadine. 1% Lidocaine was used for local anesthesia. Lumbar
puncture was performed at the L3-L4 level using a 20 gauge needle
with return of clear CSF with an opening pressure of 9 cm water. 12
ml of CSF were obtained for laboratory studies. The patient
tolerated the procedure well and there were no apparent
complications.
IMPRESSION: Successful L3-L4 lumbar puncture without immediate postprocedure
complication.

12 mL CSF obtained for laboratory studies.

## 2019-07-07 IMAGING — MR MR LUMBAR SPINE WO/W CM
4 of 7 series · 19 of 48 positions shown · IV contrast (yes)
Comparison: X-ray [DATE]

CLINICAL DATA: Back pain, fever.

EXAM:
MRI LUMBAR SPINE WITHOUT AND WITH CONTRAST
TECHNIQUE: Multiplanar and multiecho pulse sequences of the lumbar spine were
obtained without and with intravenous contrast.
CONTRAST:  10mL GADAVIST GADOBUTROL 1 MMOL/ML IV SOLN

[Series 3: T1 · sagittal · 4.0mm · 0.51mm/px · 3 of 14 slices shown (1 of 2)]
[im 1/14]
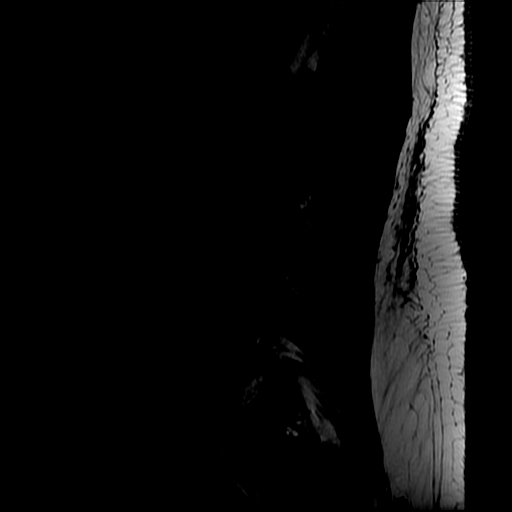
[im 7/14]
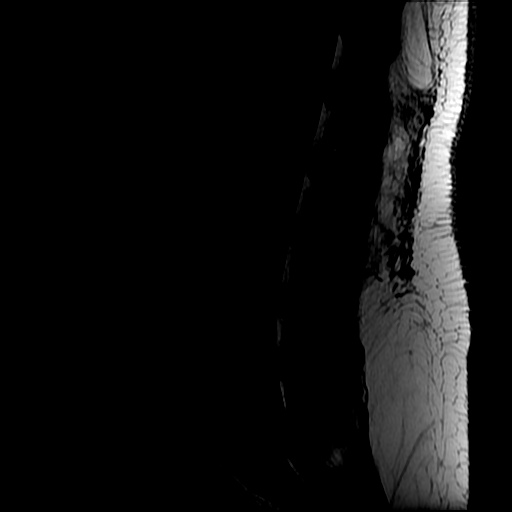
[im 14/14]
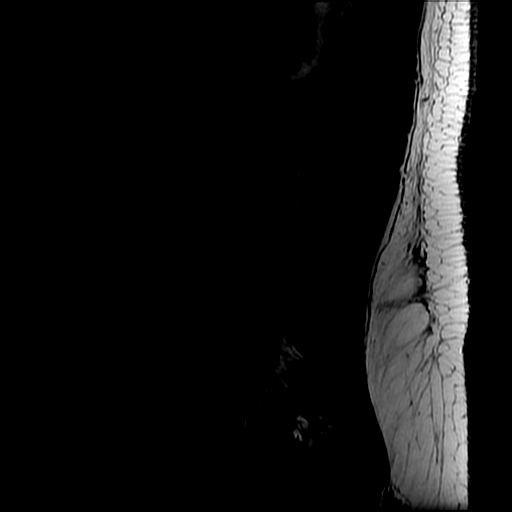

[Series 4: T2 · sagittal · 4.0mm · 0.51mm/px · 4 of 14 slices shown (1 of 2)]
[im 1/14]
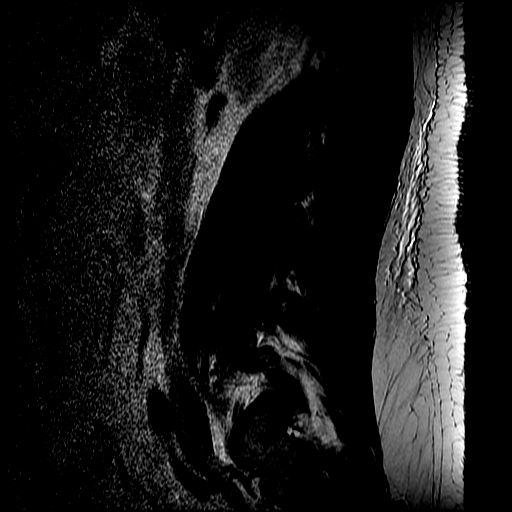
[im 5/14]
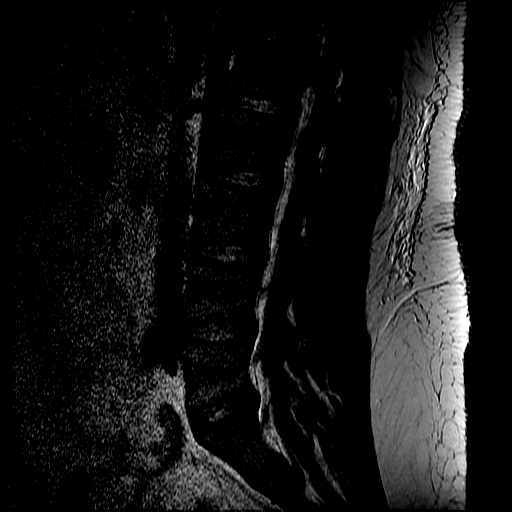
[im 9/14]
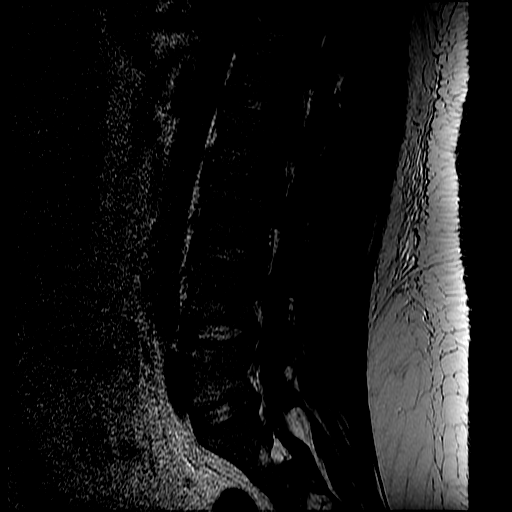
[im 14/14]
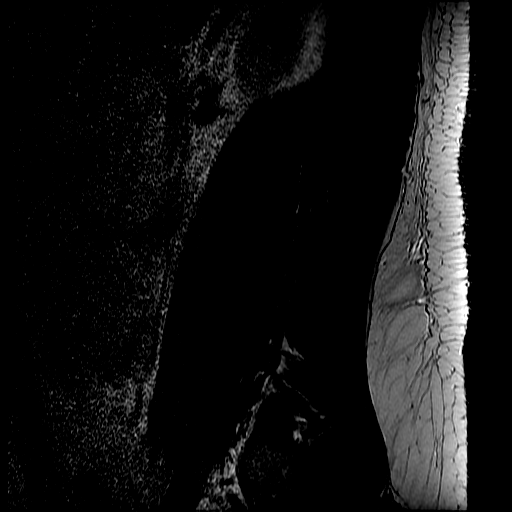

[Series 6: T2 · axial · 4.0mm · 0.39mm/px · z∈[-92,+112]mm · 9 of 42 slices shown (2 of 2)]
[im 1/42]
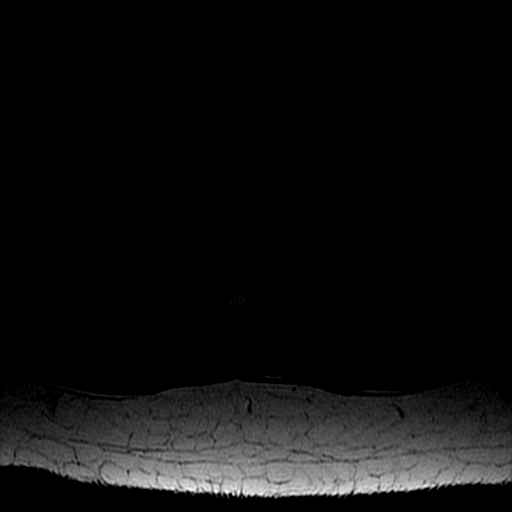
[im 5/42]
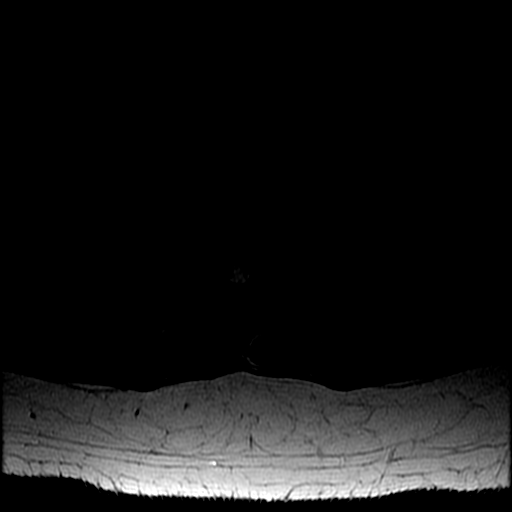
[im 9/42]
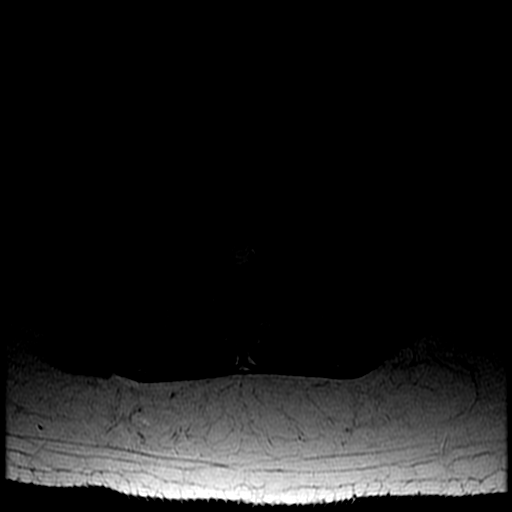
[im 13/42]
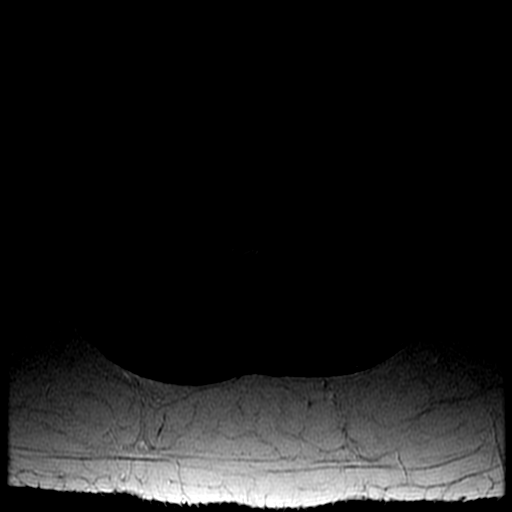
[im 17/42]
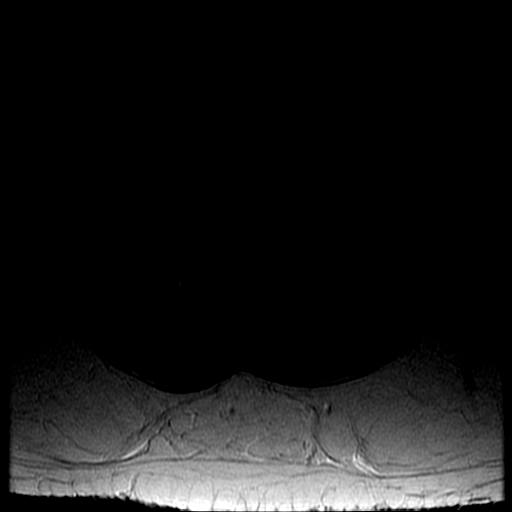
[im 21/42]
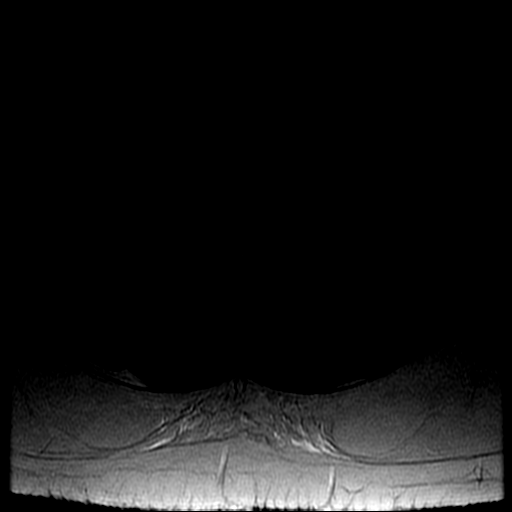
[im 25/42]
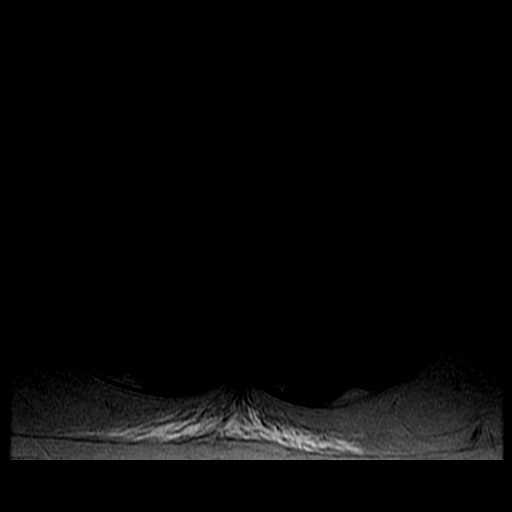
[im 29/42]
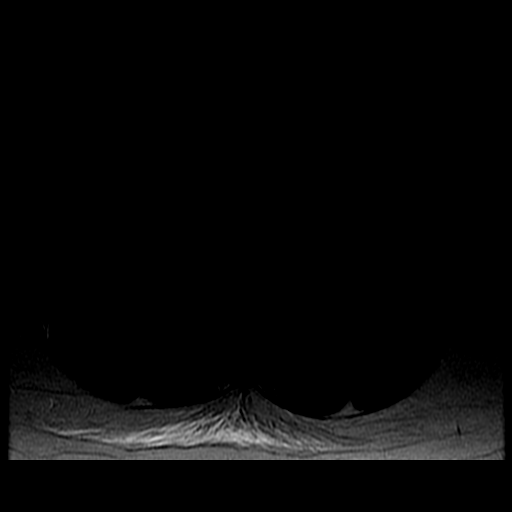
[im 37/42]
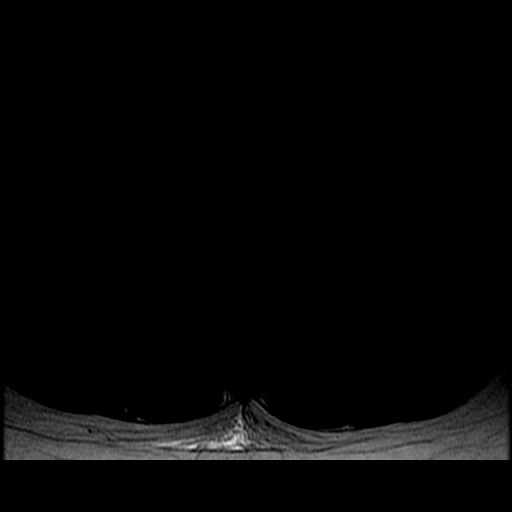

[Series 7: T1 · axial · 4.0mm · 0.39mm/px · z∈[-72,+112]mm · 3 of 42 slices shown (2 of 2)]
[im 5/42]
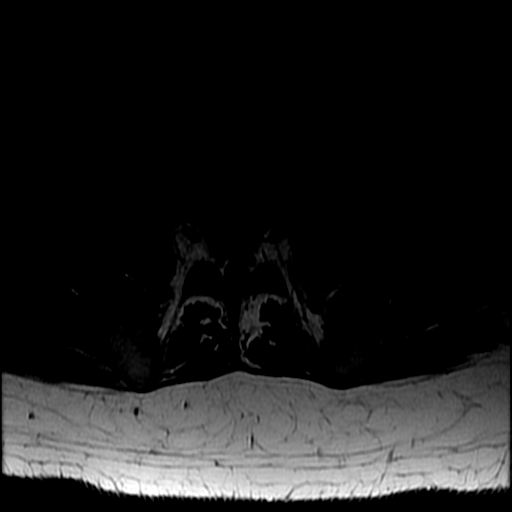
[im 21/42]
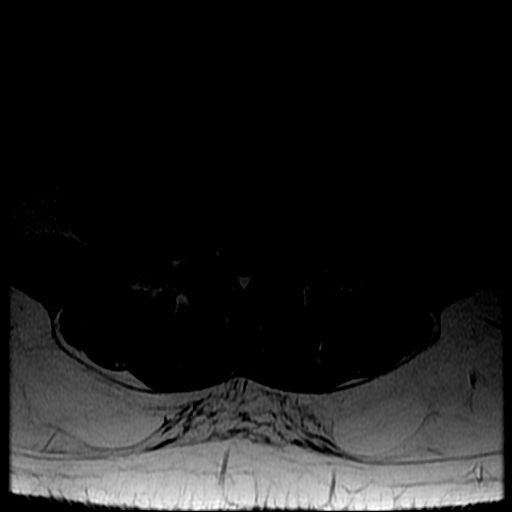
[im 37/42]
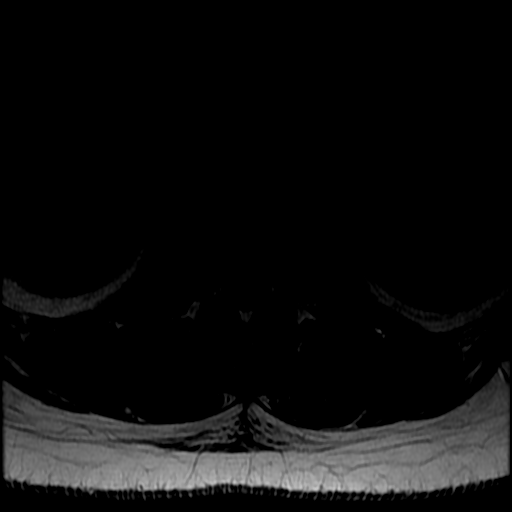

[19 of 48 positions shown; findings below may reference images not displayed]

FINDINGS: Segmentation:  Standard.

Alignment:  Physiologic.

Vertebrae: No fracture, evidence of discitis, or bone lesion. No
abnormal enhancement on postcontrast sequence.

Conus medullaris and cauda equina: Conus extends to the L1-L2 level.
Conus and cauda equina appear normal.

Paraspinal and other soft tissues: Negative.

Disc levels:

T12-L1: Unremarkable.

L1-L2: Unremarkable.

L2-L3: Unremarkable.

L3-L4: Unremarkable.

L4-L5: Unremarkable.

L5-S1: Minimal degenerative endplate spurring with mild bilateral
facet arthrosis resultant mild bilateral foraminal stenosis. No
canal stenosis.
IMPRESSION: 1. No MR evidence for discitis-osteomyelitis.
2. Mild facet arthropathy with minimal degenerative endplate
spurring at L5-S1 with mild bilateral foraminal stenosis.

## 2019-07-07 IMAGING — DX DG CHEST 1V
1 series · 1 of 1 positions shown · non-contrast
Comparison: [DATE]

CLINICAL DATA: Fever, COVID positive

EXAM:
CHEST  1 VIEW

[chest ap]
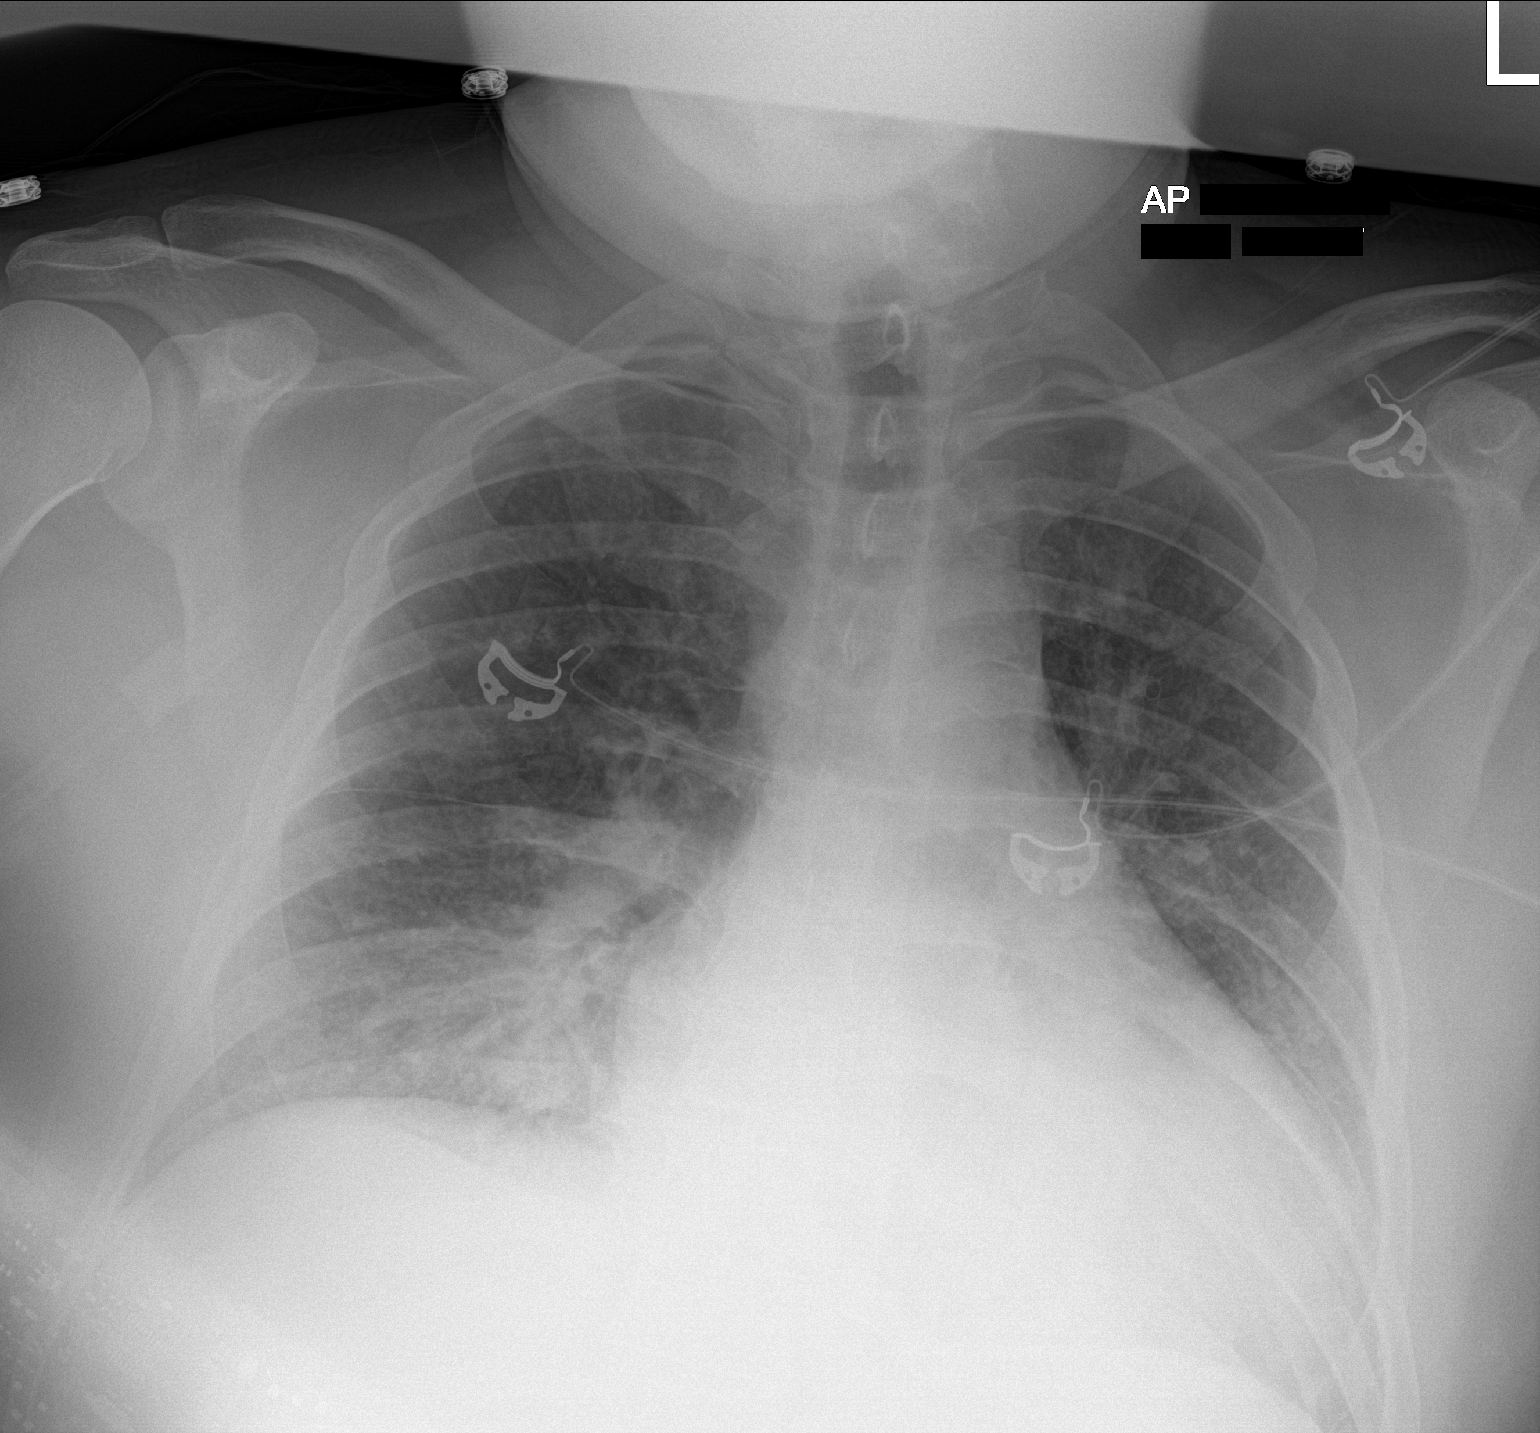

[1 of 1 positions shown; findings below may reference images not displayed]

FINDINGS: There is consolidation at the right lung base. Possible retrocardiac
opacity as well. Mild interstitial prominence. No significant
pleural effusion. Heart size is likely within normal limits for
portable technique.
IMPRESSION: Consolidation at the right lung base suspicious for pneumonia.
Possible retrocardiac opacity as well

## 2019-07-07 IMAGING — US US ABDOMEN LIMITED
1 series · 14 of 25 positions shown · non-contrast
Comparison: [DATE].

CLINICAL DATA: Abnormal liver function tests.

EXAM:
ULTRASOUND ABDOMEN LIMITED RIGHT UPPER QUADRANT

[Series 1: us abdomen limited · 14 of 50 slices shown]
[im 1/50]
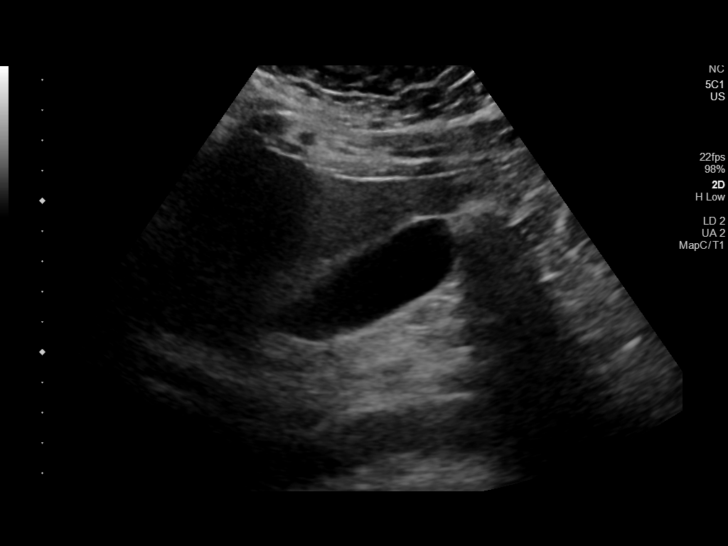
[im 5/50]
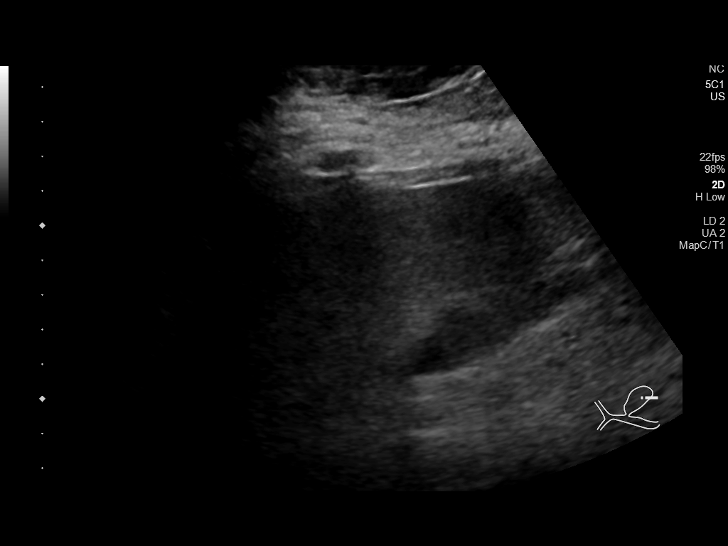
[im 9/50]
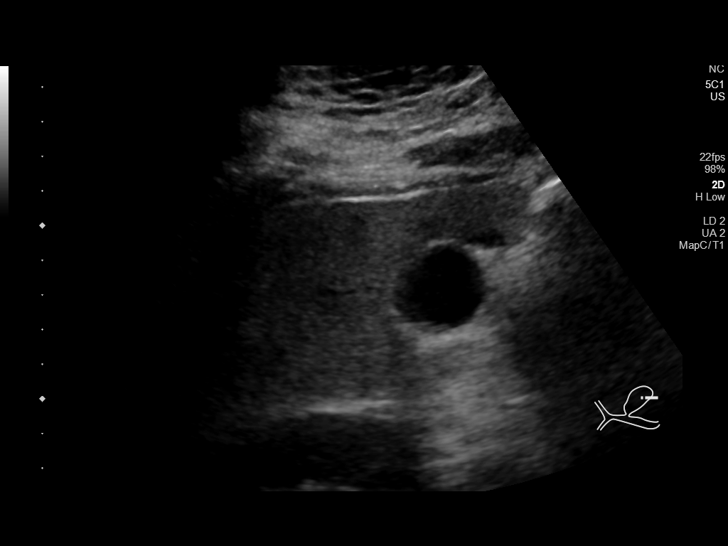
[im 13/50]
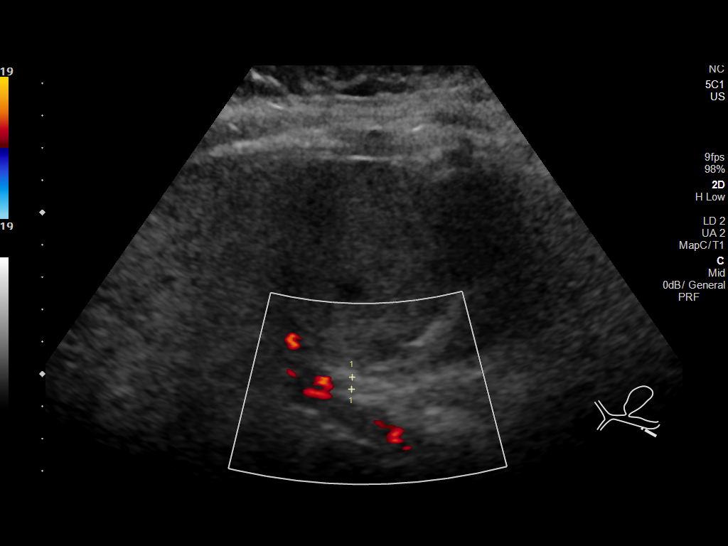
[im 17/50]
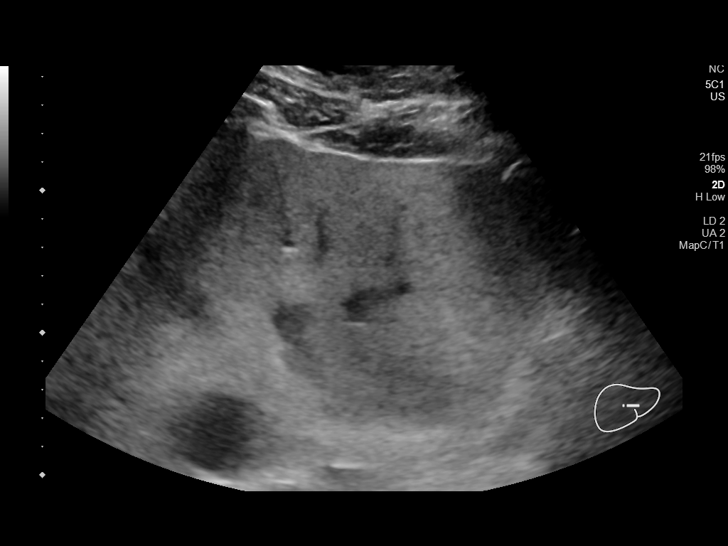
[im 19/50]
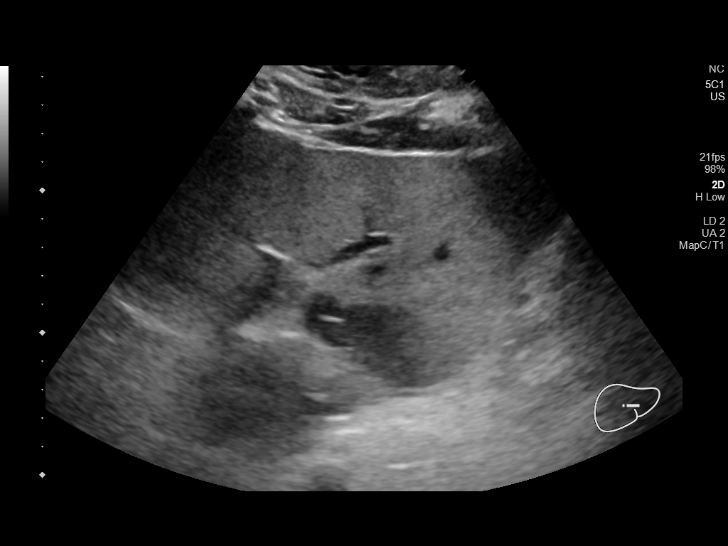
[im 23/50]
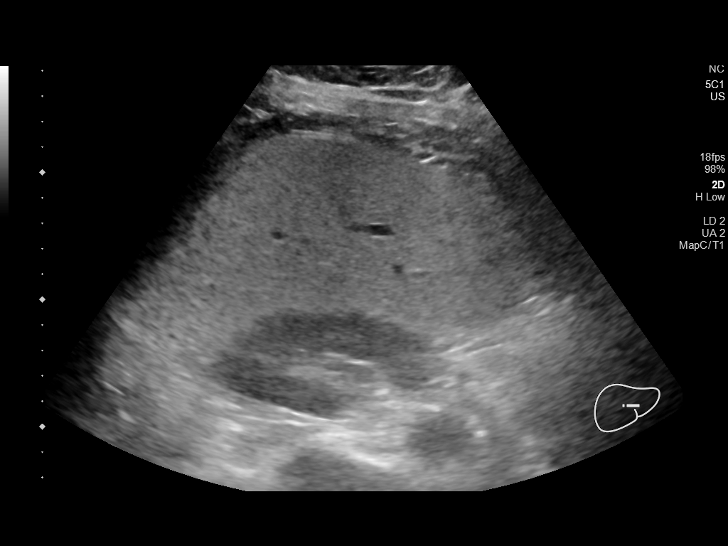
[im 27/50]
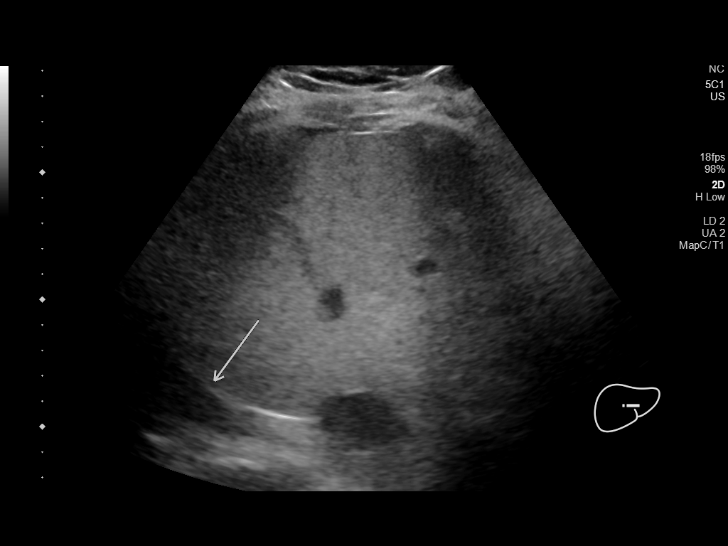
[im 31/50]
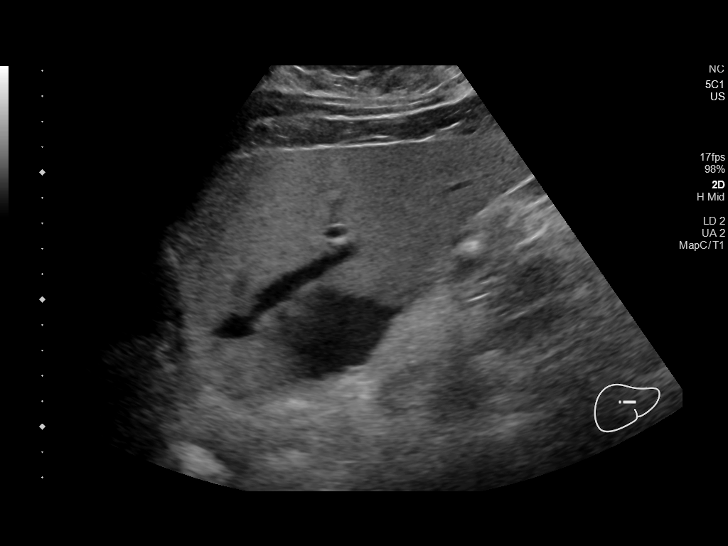
[im 33/50]
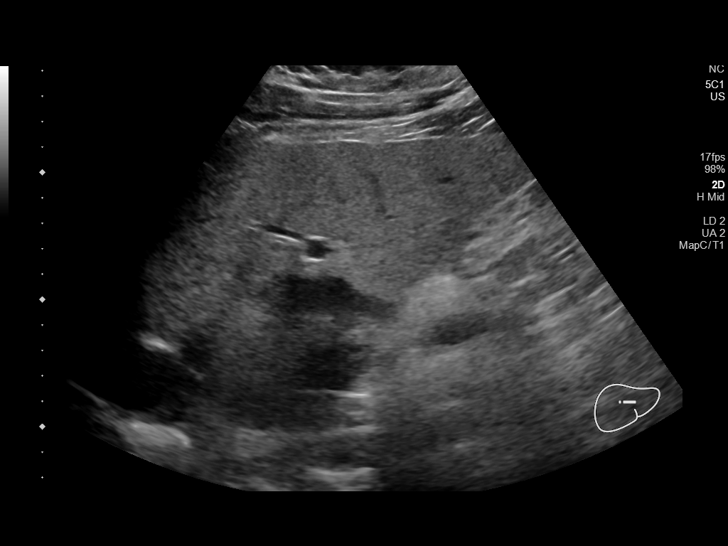
[im 37/50]
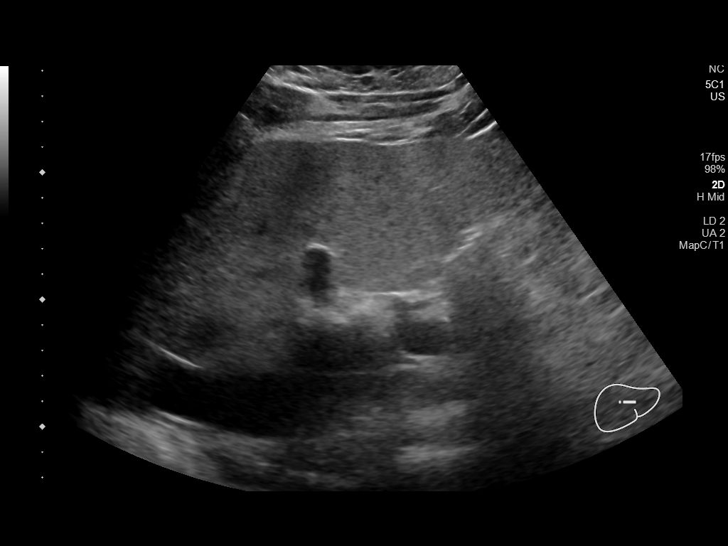
[im 41/50]
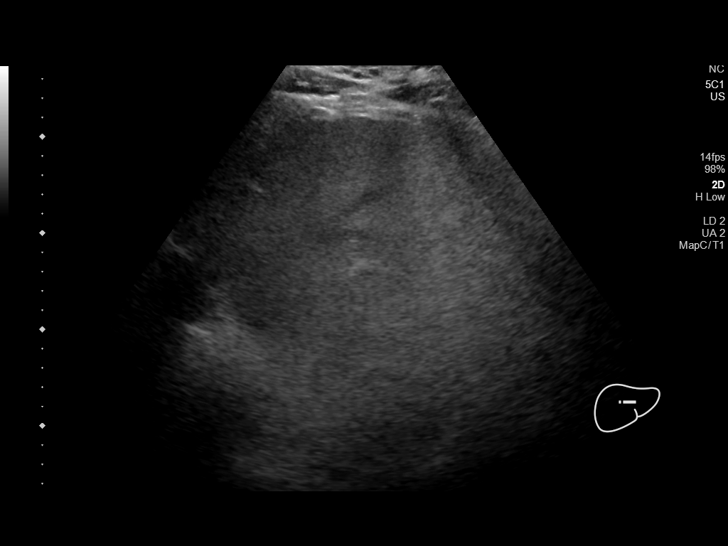
[im 45/50]
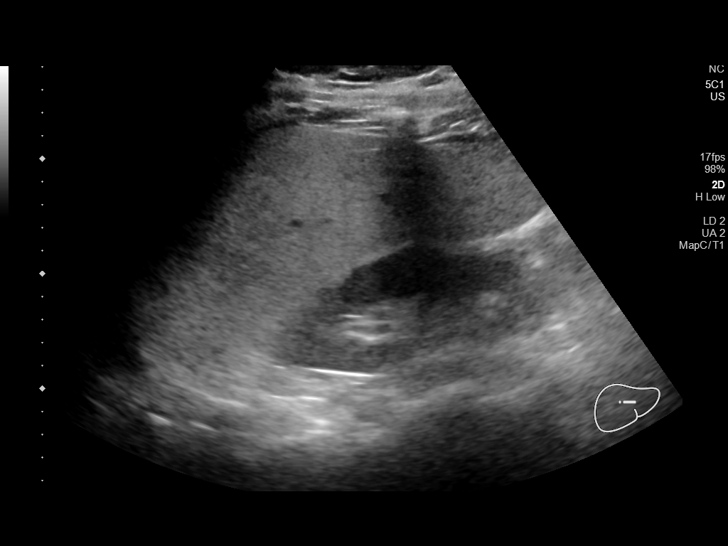
[im 50/50]
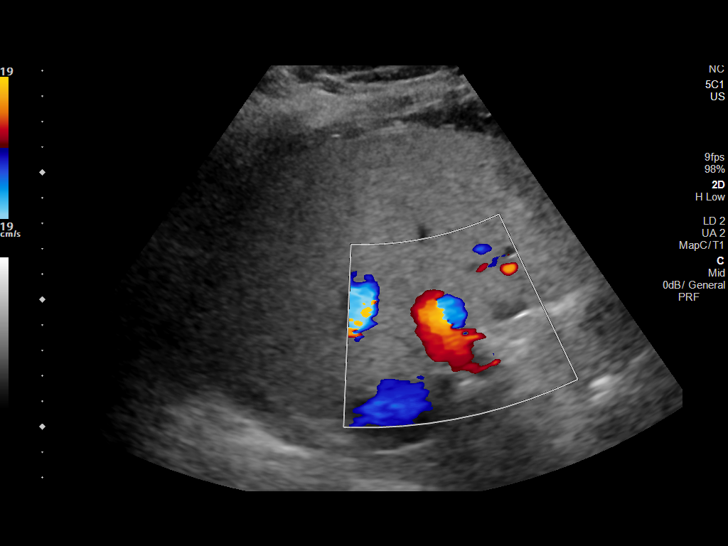

[14 of 25 positions shown; findings below may reference images not displayed]

FINDINGS: Gallbladder:

No gallstones or wall thickening visualized. No sonographic Murphy
sign noted by sonographer.

Common bile duct:

Diameter: 4 mm which is within normal limits.

Liver:

Increased echogenicity of hepatic parenchyma is noted. 6 cm low
density is noted is noted within the left hepatic lobe of uncertain
etiology. No corresponding abnormality is seen on the CT scan of the
previous day. Portal vein is patent on color Doppler imaging with
normal direction of blood flow towards the liver.

Other: Small amount of perihepatic fluid is noted.
IMPRESSION: Increased echogenicity of hepatic parenchyma is noted consistent
with hepatic steatosis. 6 cm anechoic structure is noted within the
left hepatic lobe that is not visualized on CT scan performed the
previous day; etiology is unknown, but abscess cannot be excluded
although unlikely. Further evaluation with repeat CT scan, or MRI if
the patient can hold still and follow breathing instructions, is
recommended.

## 2019-07-07 MED ORDER — LACTATED RINGERS IV SOLN
INTRAVENOUS | Status: DC
  Administered 2019-07-07 – 2019-07-08 (×3): via INTRAVENOUS

## 2019-07-07 MED ORDER — SODIUM CHLORIDE 0.9 % IV SOLN
INTRAVENOUS | Status: DC | PRN
  Administered 2019-07-07: 500 mL via INTRAVENOUS

## 2019-07-07 MED ORDER — DIAZEPAM 2 MG PO TABS
2.0000 mg | ORAL_TABLET | Freq: Once | ORAL | Status: AC
  Administered 2019-07-07: 2 mg via ORAL
  Filled 2019-07-07: qty 1

## 2019-07-07 MED ORDER — CHLORHEXIDINE GLUCONATE CLOTH 2 % EX PADS
6.0000 | MEDICATED_PAD | Freq: Every day | CUTANEOUS | Status: DC
  Administered 2019-07-07 – 2019-07-14 (×8): 6 via TOPICAL

## 2019-07-07 MED ORDER — ACETAMINOPHEN 650 MG RE SUPP: 650.0000 mg | Freq: Four times a day (QID) | RECTAL | Status: DC | PRN

## 2019-07-07 MED ORDER — CHLORHEXIDINE GLUCONATE 0.12 % MT SOLN
15.0000 mL | Freq: Two times a day (BID) | OROMUCOSAL | Status: DC
  Administered 2019-07-07 – 2019-07-14 (×13): 15 mL via OROMUCOSAL
  Filled 2019-07-07 (×14): qty 15

## 2019-07-07 MED ORDER — POTASSIUM CHLORIDE IN NACL 20-0.9 MEQ/L-% IV SOLN
INTRAVENOUS | Status: DC
  Administered 2019-07-07: 05:00:00 via INTRAVENOUS
  Filled 2019-07-07: qty 1000

## 2019-07-07 MED ORDER — POTASSIUM CHLORIDE 10 MEQ/100ML IV SOLN
10.0000 meq | INTRAVENOUS | Status: AC
  Administered 2019-07-07 (×4): 10 meq via INTRAVENOUS
  Filled 2019-07-07 (×5): qty 100

## 2019-07-07 MED ORDER — VANCOMYCIN HCL IN DEXTROSE 1-5 GM/200ML-% IV SOLN
1000.0000 mg | Freq: Three times a day (TID) | INTRAVENOUS | Status: DC
  Administered 2019-07-07 – 2019-07-10 (×9): 1000 mg via INTRAVENOUS
  Filled 2019-07-07 (×9): qty 200

## 2019-07-07 MED ORDER — GADOBUTROL 1 MMOL/ML IV SOLN
10.0000 mL | Freq: Once | INTRAVENOUS | Status: AC | PRN
  Administered 2019-07-07: 10 mL via INTRAVENOUS

## 2019-07-07 MED ORDER — VANCOMYCIN HCL 10 G IV SOLR
1750.0000 mg | Freq: Two times a day (BID) | INTRAVENOUS | Status: DC
  Filled 2019-07-07: qty 1750

## 2019-07-07 MED ORDER — ONDANSETRON HCL 4 MG/2ML IJ SOLN
4.0000 mg | Freq: Three times a day (TID) | INTRAMUSCULAR | Status: DC | PRN
  Administered 2019-07-07: 4 mg via INTRAVENOUS

## 2019-07-07 MED ORDER — ACETAMINOPHEN 325 MG PO TABS
650.0000 mg | ORAL_TABLET | Freq: Four times a day (QID) | ORAL | Status: DC | PRN
  Administered 2019-07-07 – 2019-07-13 (×14): 650 mg via ORAL
  Filled 2019-07-07 (×15): qty 2

## 2019-07-07 MED ORDER — ONDANSETRON HCL 4 MG/2ML IJ SOLN
4.0000 mg | Freq: Four times a day (QID) | INTRAMUSCULAR | Status: DC | PRN
  Administered 2019-07-08 (×3): 4 mg via INTRAVENOUS
  Filled 2019-07-07 (×3): qty 2

## 2019-07-07 MED ORDER — ONDANSETRON HCL 4 MG/2ML IJ SOLN
4.0000 mg | Freq: Once | INTRAMUSCULAR | Status: AC
  Administered 2019-07-07: 4 mg via INTRAVENOUS
  Filled 2019-07-07: qty 2

## 2019-07-07 MED ORDER — SODIUM CHLORIDE 0.9 % IV SOLN
INTRAVENOUS | Status: DC | PRN
  Administered 2019-07-07: 500 mL via INTRAVENOUS
  Administered 2019-07-09: 250 mL via INTRAVENOUS
  Administered 2019-07-13 – 2019-07-14 (×2): 500 mL via INTRAVENOUS

## 2019-07-07 MED ORDER — ORAL CARE MOUTH RINSE
15.0000 mL | Freq: Two times a day (BID) | OROMUCOSAL | Status: DC
  Administered 2019-07-07 – 2019-07-12 (×8): 15 mL via OROMUCOSAL

## 2019-07-07 MED ORDER — SODIUM CHLORIDE 0.9 % IV SOLN
2.0000 g | Freq: Two times a day (BID) | INTRAVENOUS | Status: DC
  Administered 2019-07-07 – 2019-07-14 (×15): 2 g via INTRAVENOUS
  Filled 2019-07-07: qty 20
  Filled 2019-07-07 (×2): qty 2
  Filled 2019-07-07: qty 20
  Filled 2019-07-07 (×3): qty 2
  Filled 2019-07-07: qty 20
  Filled 2019-07-07 (×2): qty 2
  Filled 2019-07-07: qty 20
  Filled 2019-07-07 (×3): qty 2
  Filled 2019-07-07: qty 20

## 2019-07-07 MED ORDER — IBUPROFEN 200 MG PO TABS
200.0000 mg | ORAL_TABLET | Freq: Four times a day (QID) | ORAL | Status: AC | PRN
  Administered 2019-07-07 – 2019-07-08 (×4): 200 mg via ORAL
  Filled 2019-07-07 (×4): qty 1

## 2019-07-07 MED ORDER — LACTATED RINGERS IV BOLUS
3000.0000 mL | Freq: Once | INTRAVENOUS | Status: AC
  Administered 2019-07-07: 3000 mL via INTRAVENOUS

## 2019-07-07 MED ORDER — SODIUM CHLORIDE 0.9 % IV SOLN
100.0000 mg | Freq: Two times a day (BID) | INTRAVENOUS | Status: DC
  Administered 2019-07-07 – 2019-07-14 (×15): 100 mg via INTRAVENOUS
  Filled 2019-07-07 (×16): qty 100

## 2019-07-07 MED ORDER — ONDANSETRON HCL 4 MG/2ML IJ SOLN
4.0000 mg | Freq: Three times a day (TID) | INTRAMUSCULAR | Status: DC
  Filled 2019-07-07: qty 2

## 2019-07-07 MED ORDER — ACETAMINOPHEN 500 MG PO TABS
1000.0000 mg | ORAL_TABLET | Freq: Once | ORAL | Status: AC
  Administered 2019-07-07: 1000 mg via ORAL
  Filled 2019-07-07: qty 2

## 2019-07-07 NOTE — ED Notes (Signed)
Pt's O2 noted to still be 88% on 2L of O2.  O2 increased to 4L.  Pt then placed on high flow [place] at 10 w/ immediate improvement.

## 2019-07-07 NOTE — Procedures (Signed)
  Successful L3-L4 lumbar puncture without immediate post-procedure complication.   12 mL CSF sent for laboratory studies.

## 2019-07-07 NOTE — Progress Notes (Signed)
Pharmacy Antibiotic Note  Raymond Owens is a 28 y.o. male presented to [facility] on 07/06/2019 with  fever, HA, AMS and n/v.  Lumbar puncture performed in the ED was unsucessful. He was started on abx sepsis, RMSF and suspected meningitis.  Patient was subsequently transferred to [facility] for further medical management.  - acyclovir d/ced on 11/12, cefepime changed to ceftriaxone on 11/12 per recom. from ID  Plan: - Will adjust vancomycin dose to 1000 mg IV q8h for r/o meningitis for goal vancomycin trough level 15-20 - ceftriaxone 2gm IV q12h - doxycycline 100 mg IV q12h ______________________________________________  Height: 5\' 11"  (180.3 cm) Weight: 244 lb 11.4 oz (111 kg) IBW/kg (Calculated) : 75.3  Temp (24hrs), Avg:102 F (38.9 C), Min:98.6 F (37 C), Max:104.2 F (40.1 C)  Recent Labs  Lab 07/06/19 1524 07/06/19 1525 07/06/19 1735 07/06/19 2132 07/07/19 0546  WBC 28.7*  --   --   --  26.7*  CREATININE 0.86  --   --   --  0.89  LATICACIDVEN  --  3.3* 2.3* 2.2*  --     Estimated Creatinine Clearance: 158 mL/min (by C-G formula based on SCr of 0.89 mg/dL).    Allergies  Allergen Reactions  . Penicillins Rash    Did it involve swelling of the face/tongue/throat, SOB, or low BP? No Did it involve sudden or severe rash/hives, skin peeling, or any reaction on the inside of your mouth or nose? Yes Did you need to seek medical attention at a hospital or doctor's office? No When did it last happen?unknown If all above answers are "NO", may proceed with cephalosporin use.      Thank you for allowing pharmacy to be a part of this patient's care.  Lynelle Doctor 07/07/2019 11:13 AM

## 2019-07-07 NOTE — Progress Notes (Addendum)
CRITICAL VALUE ALERT  Critical Value:  PMN and mononuclear white blood cells seen in CSF with no organisms; 460 WBC in CSF    Date & Time Notied:  07/07/2019 1556  Provider Notified: Ogbata  Orders Received/Actions taken: currently awaiting orders

## 2019-07-07 NOTE — Progress Notes (Signed)
Covid and Respiratory Panel are negative.  Discussed isolation with Dr.Kim and he advised to keep on Airborne/Contact d/t possible [facility] Spotted Fever, so isolation is not Covid related at this time.  Might need infection prevention to review in the morning.  Jacqulyn Ducking ICU/SD RN4 / Care Coordinator / Rapid Response Nurse Rapid Response Number:  [phone] ICU Charge Nurse Number:  [phone]

## 2019-07-07 NOTE — Progress Notes (Signed)
Raquel Sarna from Ultrasound called in regards to a stat Abdominal U/S.  I discussed with Dr. Maudie Mercury and he advised does not need to be stat can be done after 0630, or whenever morning ultrasound tech comes in, a tech does not need to be call in for this test.  Jacqulyn Ducking ICU/SD [facility] / Care Coordinator / Rapid Response Nurse Rapid Response Number:  [phone] ICU Charge Nurse Number:  [phone]

## 2019-07-07 NOTE — Progress Notes (Signed)
Pharmacy Antibiotic Note  Raymond Owens is a 28 y.o. male admitted on 07/06/2019 with sepsis.  Pharmacy has been consulted for cefepime dosing.  Plan: Cefepime 2 gm IV q8h Doxy 100 mg q12h (MD) F/u scr/cultures/levels Will adjust Vancomycin to 1750 mg IV q12h for est AUC =532 Use scr = 0.86 and Vd = 0.5  Height: 5\' 11"  (180.3 cm) Weight: 244 lb 11.4 oz (111 kg) IBW/kg (Calculated) : 75.3  Temp (24hrs), Avg:102.1 F (38.9 C), Min:98.6 F (37 C), Max:104.2 F (40.1 C)  Recent Labs  Lab 07/06/19 1524 07/06/19 1525 07/06/19 1735 07/06/19 2132  WBC 28.7*  --   --   --   CREATININE 0.86  --   --   --   LATICACIDVEN  --  3.3* 2.3* 2.2*    Estimated Creatinine Clearance: 163.5 mL/min (by C-G formula based on SCr of 0.86 mg/dL).    Allergies  Allergen Reactions  . Penicillins Rash    Antimicrobials this admission: 11/11 cefepime >>  11/11 vancomycin >> 11/12 doxy >>   Dose adjustments this admission:   Microbiology results:  BCx:   UCx:    Sputum:    MRSA PCR:   Thank you for allowing pharmacy to be a part of this patient's care.  Dorrene German 07/07/2019 5:18 AM

## 2019-07-07 NOTE — Consult Note (Signed)
Date of Admission:  07/06/2019          Reason for Consult: Sepsis, meningitis, rash    Referring Provider: Dr. Dartha Lodgegbata   Assessment:  1. Sepsis with  2. Meningitis 3. Rash 4. Multiple lung nodules bilaterally at bases 5. Worsening hypoxia and ? ARDS developing  Plan:  1. Change Cefepime to Ceftriaxone 2 G iV 1 12 hours 2. Continue Vancomycin, Doxycycline 3. DC acyclovir 4. Agree with LP for cell count, differential, CSF, glucose cultures, crypto antigen 5. Followup blood cultures, agree w TTE 6. Will check HIV RNA quant, RPR (though not at all c/w syphilis given septic physiology 7. Will check acute (likely negative serologies for RMSF, Ehrlichia)   Principal Problem:   Sepsis (HCC) Active Problems:   Fever   Headache   Rash   Scheduled Meds: . chlorhexidine  15 mL Mouth Rinse BID  . Chlorhexidine Gluconate Cloth  6 each Topical Daily  . mouth rinse  15 mL Mouth Rinse q12n4p   Continuous Infusions: . sodium chloride Stopped (07/07/19 0438)  . sodium chloride Stopped (07/07/19 0732)  . 0.9 % NaCl with KCl 20 mEq / L 75 mL/hr at 07/07/19 0800  . cefTRIAXone (ROCEPHIN)  IV    . doxycycline (VIBRAMYCIN) IV Stopped (07/07/19 0725)  . lactated ringers    . potassium chloride 10 mEq (07/07/19 1017)  . vancomycin     PRN Meds:.sodium chloride, sodium chloride, acetaminophen **OR** acetaminophen  HPI: Leota JacobsenSteven Fuentes is a 28 y.o. male with hx of HTN who works as a [employer], married, monogamous, no hx of illicit drug use presented with abrupt onset of fever, headache, rash on 07/04/2019, worsening dyspnea. [facility], blood cultures taken, vancomycin, cefepime doxy and acyclovir started. He does have outdoor exposure bringing in possibility of tick borne infection. CoVID test negative, resp virus panel negative. HIV ab negative  Picture seems highly c/w bacterial meningitis and with the right epidemiological clues would even strongly  consider meningococcemia though I doubt this is what he has.   He needs coverage for pneumococcus, h flu, meningococcus with vancomycin and ceftriaxone. RMSF also possible though he is not TTpenic and would continue doxycycline  Will follow closedly      Review of Systems: Review of Systems  Constitutional: Positive for chills, diaphoresis, fever and malaise/fatigue. Negative for weight loss.  HENT: Negative for congestion and sore throat.   Eyes: Negative for blurred vision and photophobia.  Respiratory: Positive for shortness of breath. Negative for cough and wheezing.   Cardiovascular: Positive for palpitations. Negative for chest pain and leg swelling.  Gastrointestinal: Negative for abdominal pain, blood in stool, constipation, diarrhea, heartburn, melena, nausea and vomiting.  Genitourinary: Negative for dysuria, flank pain and hematuria.  Musculoskeletal: Positive for myalgias. Negative for back pain, falls and joint pain.  Skin: Positive for rash. Negative for itching.  Neurological: Negative for dizziness, focal weakness, loss of consciousness, weakness and headaches.  Endo/Heme/Allergies: Does not bruise/bleed easily.  Psychiatric/Behavioral: Negative for depression and suicidal ideas. The patient does not have insomnia.     Past Medical History:  Diagnosis Date  . Hyperlipidemia     Social History   Tobacco Use  . Smoking status: Former Smoker    Quit date: 10/10/2011    Years since quitting: 7.7  . Smokeless tobacco: Never Used  Substance Use Topics  . Alcohol use: Yes  . Drug use: No    Family History  Problem  Relation Age of Onset  . Diabetes Mother   . Heart attack Maternal Uncle   . Cancer Maternal Grandmother   . Cancer Maternal Grandfather   . Cancer Paternal Grandmother   . Cancer Paternal Grandfather    Allergies  Allergen Reactions  . Penicillins Rash    Did it involve swelling of the face/tongue/throat, SOB, or low BP? No Did it involve  sudden or severe rash/hives, skin peeling, or any reaction on the inside of your mouth or nose? Yes Did you need to seek medical attention at a hospital or doctor's office? No When did it last happen?unknown If all above answers are "NO", may proceed with cephalosporin use.     OBJECTIVE: Blood pressure 139/69, pulse (!) 119, temperature (!) 102.4 F (39.1 C), temperature source Oral, resp. rate (!) 27, height 5\' 11"  (1.803 m), weight 111 kg, SpO2 93 %.  Physical Exam Vitals signs reviewed.  Constitutional:      Appearance: He is well-developed. He is ill-appearing.  HENT:     Head: Normocephalic and atraumatic.  Eyes:     Extraocular Movements: Extraocular movements intact.     Conjunctiva/sclera: Conjunctivae normal.  Neck:     Musculoskeletal: Normal range of motion and neck supple.  Cardiovascular:     Rate and Rhythm: Regular rhythm. Tachycardia present.     Heart sounds: No murmur. No friction rub. No gallop.   Pulmonary:     Effort: Pulmonary effort is normal. No respiratory distress.     Breath sounds: No wheezing.  Abdominal:     General: Abdomen is flat. There is no distension.     Palpations: Abdomen is soft. There is no mass.  Musculoskeletal: Normal range of motion.        General: No tenderness.  Skin:    General: Skin is warm and dry.     Coloration: Skin is not pale.     Findings: Rash present. No erythema.  Neurological:     General: No focal deficit present.     Mental Status: He is alert and oriented to person, place, and time.  Psychiatric:        Mood and Affect: Mood is depressed.        Speech: Speech normal.        Behavior: Behavior is slowed.        Thought Content: Thought content normal.        Cognition and Memory: Cognition and memory normal.        Judgment: Judgment normal.     Rash 07/07/2019:       Lab Results Lab Results  Component Value Date   WBC 26.7 (H) 07/07/2019   HGB 13.1 07/07/2019   HCT 39.0 07/07/2019    MCV 89.4 07/07/2019   PLT 240 07/07/2019    Lab Results  Component Value Date   CREATININE 0.89 07/07/2019   BUN 15 07/07/2019   NA 134 (L) 07/07/2019   K 3.1 (L) 07/07/2019   CL 100 07/07/2019   CO2 22 07/07/2019    Lab Results  Component Value Date   ALT 46 (H) 07/07/2019   AST 27 07/07/2019   ALKPHOS 88 07/07/2019   BILITOT 1.3 (H) 07/07/2019     Microbiology: Recent Results (from the past 240 hour(s))  Culture, blood (Routine x 2)     Status: None (Preliminary result)   Collection Time: 07/06/19  3:25 PM   Specimen: BLOOD  Result Value Ref Range Status   Specimen Description  Final    BLOOD RIGHT ANTECUBITAL Performed at [facility], [address] [place], [place] [address]    Special Requests   Final    BOTTLES DRAWN AEROBIC AND ANAEROBIC Blood Culture adequate volume Performed at [facility], [address] [place], [place] [address]    Culture   Final    NO GROWTH < 12 HOURS Performed at [facility], [address], [place], [place] [address]    Report Status PENDING  Incomplete  SARS CORONAVIRUS 2 (TAT 6-24 HRS) Nasopharyngeal Nasopharyngeal Swab     Status: None   Collection Time: 07/06/19  3:39 PM   Specimen: Nasopharyngeal Swab  Result Value Ref Range Status   SARS Coronavirus 2 NEGATIVE NEGATIVE Final    Comment: (NOTE) SARS-CoV-2 target nucleic acids are NOT DETECTED. The SARS-CoV-2 RNA is generally detectable in upper and lower respiratory specimens during the acute phase of infection. Negative results do not preclude SARS-CoV-2 infection, do not rule out co-infections with other pathogens, and should not be used as the sole basis for treatment or other patient management decisions. Negative results must be combined with clinical observations, patient history, and epidemiological information. The expected result is Negative. Fact Sheet for Patients: HairSlick.no Fact Sheet for  Healthcare Providers: quierodirigir.com This test is not yet approved or cleared by the [place] FDA and  has been authorized for detection and/or diagnosis of SARS-CoV-2 by FDA under an Emergency Use Authorization (EUA). This EUA will remain  in effect (meaning this test can be used) for the duration of the COVID-19 declaration under Section 56 4(b)(1) of the Act, 21 U.S.C. section 360bbb-3(b)(1), unless the authorization is terminated or revoked sooner. Performed at [facility], [address] [place], [place] [address]   Culture, blood (Routine x 2)     Status: None (Preliminary result)   Collection Time: 07/06/19  4:00 PM   Specimen: BLOOD  Result Value Ref Range Status   Specimen Description   Final    BLOOD RIGHT WRIST Performed at [facility], [address] [place], [place] [address]    Special Requests   Final    BOTTLES DRAWN AEROBIC AND ANAEROBIC Blood Culture adequate volume Performed at [facility], [address] [place], [place] [address]    Culture   Final    NO GROWTH < 12 HOURS Performed at [facility], [address], [place], [place] [address]    Report Status PENDING  Incomplete  Respiratory Panel by PCR     Status: None   Collection Time: 07/06/19  9:42 PM   Specimen: Nasopharyngeal Swab; Respiratory  Result Value Ref Range Status   Adenovirus NOT DETECTED NOT DETECTED Final   Coronavirus 229E NOT DETECTED NOT DETECTED Final    Comment: (NOTE) The Coronavirus on the Respiratory Panel, DOES NOT test for the novel  Coronavirus (2019 nCoV)    Coronavirus HKU1 NOT DETECTED NOT DETECTED Final   Coronavirus NL63 NOT DETECTED NOT DETECTED Final   Coronavirus OC43 NOT DETECTED NOT DETECTED Final   Metapneumovirus NOT DETECTED NOT DETECTED Final   Rhinovirus / Enterovirus NOT DETECTED NOT DETECTED Final   Influenza A NOT DETECTED NOT DETECTED Final   Influenza B NOT DETECTED NOT DETECTED  Final   Parainfluenza Virus 1 NOT DETECTED NOT DETECTED Final   Parainfluenza Virus 2 NOT DETECTED NOT DETECTED Final   Parainfluenza Virus 3 NOT DETECTED  NOT DETECTED Final   Parainfluenza Virus 4 NOT DETECTED NOT DETECTED Final   Respiratory Syncytial Virus NOT DETECTED NOT DETECTED Final   Bordetella pertussis NOT DETECTED NOT DETECTED Final   Chlamydophila pneumoniae NOT DETECTED NOT DETECTED Final   Mycoplasma pneumoniae NOT DETECTED NOT DETECTED Final    Comment: Performed at [facility], [address] [place], [place] [address]  MRSA PCR Screening     Status: None   Collection Time: 07/07/19  3:32 AM   Specimen: Nasal Mucosa; Nasopharyngeal  Result Value Ref Range Status   MRSA by PCR NEGATIVE NEGATIVE Final    Comment:        The GeneXpert MRSA Assay (FDA approved for NASAL specimens only), is one component of a comprehensive MRSA colonization surveillance program. It is not intended to diagnose MRSA infection nor to guide or monitor treatment for MRSA infections. Performed at [facility], [address], [place], [place] [address]     Acey Lav, MD [facility] for Infectious Disease [facility] [phone] pager  07/07/2019, 12:22 PM

## 2019-07-07 NOTE — Progress Notes (Signed)
RT notified Lab that ABG being sent for analysis. This blood was drawn while PT on 5 lpm nasal cannula.

## 2019-07-07 NOTE — H&P (Addendum)
TRH H&P    Patient Demographics:    Raymond Owens, is a 28 y.o. male  MRN: [id number]  DOB - [birth year]  Admit Date - 07/06/2019  Referring MD/NP/PA:   Quita Skye Curatolog/ Doutova  Outpatient Primary MD for the patient is Donella Stade, PA-C  Patient coming from:  [place]  Chief complaint- fever   HPI:    Raymond Owens  is a 28 y.o. male,  w hyperlipidemia, who presents with c/o headache, and fever since Saturday.  Pt also notes rash, on arms, and legs and chest.  Pt states that fever was worse yesterday and presented to ER.  Pt has been working outdoors, but doesn't recall tick bite. No hx of iv drug use. Pt does note that had his teeth cleaned about 1 week ago.  + sore throat recently   In ED,  T 98.6,  T 102.8, P 104, R 24, Bp 110/64  Pox 98% on RA Wt 106.6kg  CT brain IMPRESSION: No acute intracranial pathology.  CT chest/ abd/ pelvis IMPRESSION: 1. Mild septal thickening suspicious for pulmonary edema. Trace bilateral pleural effusions. 2. Scattered ill-defined tiny pulmonary nodules in conjunction with ground-glass opacities in both lungs. Favor atypical infectious or other inflammatory etiology, (not classic for COVID-19 pneumonia). Ground-glass opacity component may represent either edema or atypical infection. Consider follow-up CT after course of treatment to ensure resolution.  Na 132, K 2.9,  Bun 19, Creatinine 0.86 Ast 45, Alt 54 Wbc 28.7, Hgb 14.4, Plt 234 (toxic granulations) LDH 272 Ferritin 494 Fibroinogen >800 D dimer 1.75  HIV negative  INR 1.4 Lactic acid 3.3 -> 2.3  Blood culture x2 pending covid-19 negative Urinalysis rbc 11-20  Pt will be admitted for sepsis (fever, tachycardia, leukocytosis, elevated lactic acid) of unclear cause r/o meningococcal meningitis  R/o RMSF    Review of systems:    In addition to the HPI above,    No changes with Vision or  hearing, No problems swallowing food or Liquids, No Chest pain, Cough or Shortness of Breath, No Abdominal pain, No Nausea or Vomiting, bowel movements are regular, No Blood in stool or Urine, No dysuria, + lower back pain No new joints pains-aches,  No new weakness, tingling, numbness in any extremity, No recent weight gain or loss, No polyuria, polydypsia or polyphagia, No significant Mental Stressors.  All other systems reviewed and are negative.    Past History of the following :    Past Medical History:  Diagnosis Date   Hyperlipidemia       History reviewed. No pertinent surgical history. None   Social History:      Social History   Tobacco Use   Smoking status: Former Smoker    Quit date: 10/10/2011    Years since quitting: 7.7   Smokeless tobacco: Never Used  Substance Use Topics   Alcohol use: Yes       Family History :     Family History  Problem Relation Age of Onset   Diabetes Mother    Heart  attack Maternal Uncle    Cancer Maternal Grandmother    Cancer Maternal Grandfather    Cancer Paternal Grandmother    Cancer Paternal Grandfather        Home Medications:   Prior to Admission medications   Medication Sig Start Date End Date Taking? Authorizing Provider  cyclobenzaprine (FLEXERIL) 10 MG tablet Take 1 tablet (10 mg total) by mouth 3 (three) times daily as needed for muscle spasms. 05/25/19   Breeback, Jade L, PA-C  diclofenac sodium (VOLTAREN) 1 % GEL Apply 4 g topically 4 (four) times daily. To affected joint. 05/25/19   Breeback, Royetta Car, PA-C  meloxicam (MOBIC) 15 MG tablet Take 1 tablet (15 mg total) by mouth daily. 05/25/19   Donella Stade, PA-C     Allergies:     Allergies  Allergen Reactions   Penicillins Rash     Physical Exam:   Vitals  Blood pressure 139/76, pulse 94, temperature (!) 104.2 F (40.1 C), temperature source Rectal, resp. rate (!) 42, height 5' 11"  (1.803 m), weight 111 kg, SpO2 97 %.  1.   General: axoxo3  2. Psychiatric: euthymic  3. Neurologic: cn2-12 intact, reflexes 2+ symmetric, diffuse with no clonus, motor 5/5 in all 4 ext Negative kernig, negative brudzinski  4. HEENMT:  Anicteric, pupils 1.77m symmetric, direct, consensual, near intact Neck: supple, no jvd  5. Respiratory : CTAB  6. Cardiovascular : Tachy s1, s2, no m/g/r  7. Gastrointestinal:  Abd: soft, obese, nt, nd, negative murphys, +bs  8. Skin:  Ext: no c/c/e,  + maculopapular rash on the left arm, check and left base of palm, also on the bilateral legs. No janeway, no osler  9.Musculoskeletal:  Good ROM,  No knee swelling.     Data Review:    CBC Recent Labs  Lab 07/06/19 1524  WBC 28.7*  HGB 14.4  HCT 42.4  PLT 234  MCV 87.1  MCH 29.6  MCHC 34.0  RDW 13.0  LYMPHSABS 1.0  MONOABS 1.7*  EOSABS 0.0  BASOSABS 0.1   ------------------------------------------------------------------------------------------------------------------  Results for orders placed or performed during the hospital encounter of 07/06/19 (from the past 48 hour(s))  Comprehensive metabolic panel     Status: Abnormal   Collection Time: 07/06/19  3:24 PM  Result Value Ref Range   Sodium 132 (L) 135 - 145 mmol/L   Potassium 2.9 (L) 3.5 - 5.1 mmol/L   Chloride 97 (L) 98 - 111 mmol/L   CO2 22 22 - 32 mmol/L   Glucose, Bld 144 (H) 70 - 99 mg/dL   BUN 19 6 - 20 mg/dL   Creatinine, Ser 0.86 0.61 - 1.24 mg/dL   Calcium 8.7 (L) 8.9 - 10.3 mg/dL   Total Protein 7.8 6.5 - 8.1 g/dL   Albumin 3.5 3.5 - 5.0 g/dL   AST 45 (H) 15 - 41 U/L   ALT 54 (H) 0 - 44 U/L   Alkaline Phosphatase 75 38 - 126 U/L   Total Bilirubin 0.9 0.3 - 1.2 mg/dL   GFR calc non Af Amer >60 >60 mL/min   GFR calc Af Amer >60 >60 mL/min   Anion gap 13 5 - 15    Comment: [facility] [address] [place] CBC with Differential     Status: Abnormal   Collection Time: 07/06/19  3:24 PM  Result  Value Ref Range   WBC 28.7 (H) 4.0 - 10.5 K/uL   RBC  4.87 4.22 - 5.81 MIL/uL   Hemoglobin 14.4 13.0 - 17.0 g/dL   HCT 42.4 39.0 - 52.0 %   MCV 87.1 80.0 - 100.0 fL   MCH 29.6 26.0 - 34.0 pg   MCHC 34.0 30.0 - 36.0 g/dL   RDW 13.0 11.5 - 15.5 %   Platelets 234 150 - 400 K/uL   nRBC 0.0 0.0 - 0.2 %   Neutrophils Relative % 88 %   Neutro Abs 25.2 (H) 1.7 - 7.7 K/uL   Lymphocytes Relative 4 %   Lymphs Abs 1.0 0.7 - 4.0 K/uL   Monocytes Relative 6 %   Monocytes Absolute 1.7 (H) 0.1 - 1.0 K/uL   Eosinophils Relative 0 %   Eosinophils Absolute 0.0 0.0 - 0.5 K/uL   Basophils Relative 0 %   Basophils Absolute 0.1 0.0 - 0.1 K/uL   WBC Morphology TOXIC GRANULATION    Immature Granulocytes 2 %   Abs Immature Granulocytes 0.43 (H) 0.00 - 0.07 K/uL    Comment: Performed at [facility], [address] [place], [place] [address]  Protime-INR     Status: Abnormal   Collection Time: 07/06/19  3:24 PM  Result Value Ref Range   Prothrombin Time 17.2 (H) 11.4 - 15.2 seconds   INR 1.4 (H) 0.8 - 1.2    Comment: (NOTE) INR goal varies based on device and disease states. Performed at [facility], [address] [place], [place] [address]   Lactic acid, plasma     Status: Abnormal   Collection Time: 07/06/19  3:25 PM  Result Value Ref Range   Lactic Acid, Venous 3.3 (HH) 0.5 - 1.9 mmol/L    Comment: CRITICAL RESULT CALLED TO, READ BACK BY AND VERIFIED WITH: MARVA SIMMS RN @1604  07/06/2019 OLSONM Performed at [facility], [address] [place], [place] [address]   Magnesium     Status: None   Collection Time: 07/06/19  3:26 PM  Result Value Ref Range   Magnesium 1.7 1.7 - 2.4 mg/dL    Comment: Performed at [facility], [address] [place], [place] [address]  SARS CORONAVIRUS 2 (TAT 6-24 HRS) Nasopharyngeal Nasopharyngeal Swab     Status: None   Collection Time: 07/06/19  3:39 PM   Specimen: Nasopharyngeal Swab  Result Value Ref Range    SARS Coronavirus 2 NEGATIVE NEGATIVE    Comment: (NOTE) SARS-CoV-2 target nucleic acids are NOT DETECTED. The SARS-CoV-2 RNA is generally detectable in upper and lower respiratory specimens during the acute phase of infection. Negative results do not preclude SARS-CoV-2 infection, do not rule out co-infections with other pathogens, and should not be used as the sole basis for treatment or other patient management decisions. Negative results must be combined with clinical observations, patient history, and epidemiological information. The expected result is Negative. Fact Sheet for Patients: SugarRoll.be Fact Sheet for Healthcare Providers: https://www.woods-mathews.com/ This test is not yet approved or cleared by the [place] FDA and  has been authorized for detection and/or diagnosis of SARS-CoV-2 by FDA under an Emergency Use Authorization (EUA). This EUA will remain  in effect (meaning this test can be used) for the duration of the COVID-19 declaration under Section 56 4(b)(1) of the Act, 21 U.S.C. section 360bbb-3(b)(1), unless the authorization is terminated or revoked sooner. Performed at [facility], [place] [address], [place], [place] [address]   Urinalysis, Routine w reflex microscopic  Status: Abnormal   Collection Time: 07/06/19  4:30 PM  Result Value Ref Range   Color, Urine YELLOW YELLOW   APPearance HAZY (A) CLEAR   Specific Gravity, Urine 1.020 1.005 - 1.030   pH 6.5 5.0 - 8.0   Glucose, UA NEGATIVE NEGATIVE mg/dL   Hgb urine dipstick MODERATE (A) NEGATIVE   Bilirubin Urine NEGATIVE NEGATIVE   Ketones, ur NEGATIVE NEGATIVE mg/dL   Protein, ur 100 (A) NEGATIVE mg/dL   Nitrite NEGATIVE NEGATIVE   Leukocytes,Ua NEGATIVE NEGATIVE    Comment: Performed at [facility], [address] [place], [place] [address]  Urinalysis, Microscopic (reflex)     Status: Abnormal   Collection Time: 07/06/19  4:30 PM    Result Value Ref Range   RBC / HPF 11-20 0 - 5 RBC/hpf   WBC, UA NONE SEEN 0 - 5 WBC/hpf   Bacteria, UA RARE (A) NONE SEEN   Squamous Epithelial / LPF 0-5 0 - 5    Comment: Performed at [facility], [address] [place], [place] [address]  Lactic acid, plasma     Status: Abnormal   Collection Time: 07/06/19  5:35 PM  Result Value Ref Range   Lactic Acid, Venous 2.3 (HH) 0.5 - 1.9 mmol/L    Comment: CRITICAL RESULT CALLED TO, READ BACK BY AND VERIFIED WITH: MARVA SIMMS RN AT 7322 ON 07/06/19 BY I.SUGUT Performed at [facility], [address] [place], [place] [address]   HIV Antibody (routine testing w rflx)     Status: None   Collection Time: 07/06/19  9:32 PM  Result Value Ref Range   HIV Screen 4th Generation wRfx NON REACTIVE NON REACTIVE    Comment: Performed at [facility] [address], [place], [place] [address]  D-dimer, quantitative     Status: Abnormal   Collection Time: 07/06/19  9:32 PM  Result Value Ref Range   D-Dimer, Quant 1.75 (H) 0.00 - 0.50 ug/mL-FEU    Comment: (NOTE) At the manufacturer cut-off of 0.50 ug/mL FEU, this assay has been documented to exclude PE with a sensitivity and negative predictive value of 97 to 99%.  At this time, this assay has not been approved by the FDA to exclude DVT/VTE. Results should be correlated with clinical presentation. Performed at [facility], [address] [place], [place] [address]   Procalcitonin     Status: None   Collection Time: 07/06/19  9:32 PM  Result Value Ref Range   Procalcitonin 3.33 ng/mL    Comment:        Interpretation: PCT > 2 ng/mL: Systemic infection (sepsis) is likely, unless other causes are known. (NOTE)       Sepsis PCT Algorithm           Lower Respiratory Tract                                      Infection PCT Algorithm    ----------------------------     ----------------------------         PCT < 0.25 ng/mL                PCT < 0.10  ng/mL         Strongly encourage             Strongly discourage   discontinuation of  antibiotics    initiation of antibiotics    ----------------------------     -----------------------------       PCT 0.25 - 0.50 ng/mL            PCT 0.10 - 0.25 ng/mL               OR       >80% decrease in PCT            Discourage initiation of                                            antibiotics      Encourage discontinuation           of antibiotics    ----------------------------     -----------------------------         PCT >= 0.50 ng/mL              PCT 0.26 - 0.50 ng/mL               AND       <80% decrease in PCT              Encourage initiation of                                             antibiotics       Encourage continuation           of antibiotics    ----------------------------     -----------------------------        PCT >= 0.50 ng/mL                  PCT > 0.50 ng/mL               AND         increase in PCT                  Strongly encourage                                      initiation of antibiotics    Strongly encourage escalation           of antibiotics                                     -----------------------------                                           PCT <= 0.25 ng/mL                                                 OR                                        >  80% decrease in PCT                                     Discontinue / Do not initiate                                             antibiotics Performed at [facility], [place] [address] [place], [place] [address]   Lactate dehydrogenase     Status: Abnormal   Collection Time: 07/06/19  9:32 PM  Result Value Ref Range   LDH 272 (H) 98 - 192 U/L    Comment: Performed at [facility], [place] [address], [place], [place] [address]  Ferritin     Status: Abnormal   Collection Time: 07/06/19  9:32 PM  Result Value Ref Range   Ferritin 494 (H) 24 - 336 ng/mL    Comment: Performed at [facility], [place] [address], [place], [place] [address]  Triglycerides     Status: None   Collection Time: 07/06/19  9:32 PM  Result Value Ref Range   Triglycerides 80 <150 mg/dL    Comment: Performed at [facility] [address] [place], [place] [address]  Fibrinogen     Status: Abnormal   Collection Time: 07/06/19  9:32 PM  Result Value Ref Range   Fibrinogen >800 (H) 210 - 475 mg/dL    Comment: REPEATED TO VERIFY Performed at [facility] [address] [place], [place] [address]   C-reactive protein     Status: Abnormal   Collection Time: 07/06/19  9:32 PM  Result Value Ref Range   CRP 27.3 (H) <1.0 mg/dL    Comment: Performed at [facility] [address], [place], [place] [address]  Lactic acid, plasma     Status: Abnormal   Collection Time: 07/06/19  9:32 PM  Result Value Ref Range   Lactic Acid, Venous 2.2 (HH) 0.5 - 1.9 mmol/L    Comment: CRITICAL RESULT CALLED TO, READ BACK BY AND VERIFIED WITH: Marsa Aris RN AT 2214 ON 07/06/19 BY I.SUGUT Performed at [facility], [address] [place], [place] [address]   Respiratory Panel by PCR     Status: None   Collection Time: 07/06/19  9:42 PM   Specimen: Nasopharyngeal Swab; Respiratory  Result Value Ref Range   Adenovirus NOT DETECTED NOT DETECTED   Coronavirus 229E NOT DETECTED NOT DETECTED    Comment: (NOTE) The Coronavirus on the Respiratory Panel, DOES NOT test for the novel  Coronavirus (2019 nCoV)    Coronavirus HKU1 NOT DETECTED NOT DETECTED   Coronavirus NL63 NOT DETECTED NOT DETECTED   Coronavirus OC43 NOT DETECTED NOT DETECTED   Metapneumovirus NOT DETECTED NOT DETECTED   Rhinovirus / Enterovirus NOT DETECTED NOT DETECTED   Influenza A NOT DETECTED NOT DETECTED   Influenza B NOT DETECTED NOT DETECTED   Parainfluenza Virus 1 NOT DETECTED NOT DETECTED   Parainfluenza Virus 2 NOT DETECTED NOT DETECTED   Parainfluenza Virus 3 NOT DETECTED NOT DETECTED   Parainfluenza Virus 4  NOT DETECTED NOT DETECTED   Respiratory Syncytial Virus NOT DETECTED NOT DETECTED   Bordetella pertussis NOT DETECTED NOT DETECTED   Chlamydophila pneumoniae NOT DETECTED NOT DETECTED  Mycoplasma pneumoniae NOT DETECTED NOT DETECTED    Comment: Performed at [facility] [address] [place], [place] [address]    Chemistries  Recent Labs  Lab 07/06/19 1524 07/06/19 1526  NA 132*  --   K 2.9*  --   CL 97*  --   CO2 22  --   GLUCOSE 144*  --   BUN 19  --   CREATININE 0.86  --   CALCIUM 8.7*  --   MG  --  1.7  AST 45*  --   ALT 54*  --   ALKPHOS 75  --   BILITOT 0.9  --    ------------------------------------------------------------------------------------------------------------------  ------------------------------------------------------------------------------------------------------------------ GFR: Estimated Creatinine Clearance: 163.5 mL/min (by C-G formula based on SCr of 0.86 mg/dL). Liver Function Tests: Recent Labs  Lab 07/06/19 1524  AST 45*  ALT 54*  ALKPHOS 75  BILITOT 0.9  PROT 7.8  ALBUMIN 3.5   No results for input(s): LIPASE, AMYLASE in the last 168 hours. No results for input(s): AMMONIA in the last 168 hours. Coagulation Profile: Recent Labs  Lab 07/06/19 1524  INR 1.4*   Cardiac Enzymes: No results for input(s): CKTOTAL, CKMB, CKMBINDEX, TROPONINI in the last 168 hours. BNP (last 3 results) No results for input(s): PROBNP in the last 8760 hours. HbA1C: No results for input(s): HGBA1C in the last 72 hours. CBG: No results for input(s): GLUCAP in the last 168 hours. Lipid Profile: Recent Labs    07/06/19 2132  TRIG 80   Thyroid Function Tests: No results for input(s): TSH, T4TOTAL, FREET4, T3FREE, THYROIDAB in the last 72 hours. Anemia Panel: Recent Labs    07/06/19 2132  FERRITIN 494*    --------------------------------------------------------------------------------------------------------------- Urine analysis:     Component Value Date/Time   COLORURINE YELLOW 07/06/2019 1630   APPEARANCEUR HAZY (A) 07/06/2019 1630   LABSPEC 1.020 07/06/2019 1630   PHURINE 6.5 07/06/2019 1630   GLUCOSEU NEGATIVE 07/06/2019 1630   HGBUR MODERATE (A) 07/06/2019 1630   BILIRUBINUR NEGATIVE 07/06/2019 1630   KETONESUR NEGATIVE 07/06/2019 1630   PROTEINUR 100 (A) 07/06/2019 1630   NITRITE NEGATIVE 07/06/2019 1630   LEUKOCYTESUR NEGATIVE 07/06/2019 1630      Imaging Results:    Ct Head Wo Contrast  Result Date: 07/06/2019 CLINICAL DATA:  Altered mental status EXAM: CT HEAD WITHOUT CONTRAST TECHNIQUE: Contiguous axial images were obtained from the base of the skull through the vertex without intravenous contrast. COMPARISON:  None. FINDINGS: Brain: No evidence of acute infarction, hemorrhage, hydrocephalus, extra-axial collection or mass lesion/mass effect. Vascular: No hyperdense vessel or unexpected calcification. Skull: Normal. Negative for fracture or focal lesion. Sinuses/Orbits: No acute finding. Other: None. IMPRESSION: No acute intracranial pathology. Electronically Signed   By: Eddie Candle M.D.   On: 07/06/2019 17:28   Ct Chest Wo Contrast  Result Date: 07/06/2019 CLINICAL DATA:  Chest pain. Fever and shortness of breath. Abnormal lung base findings on abdominal CT. EXAM: CT CHEST WITHOUT CONTRAST TECHNIQUE: Multidetector CT imaging of the chest was performed following the standard protocol without IV contrast. COMPARISON:  Chest radiograph earlier today. Lung bases from abdominal CT earlier today. FINDINGS: Cardiovascular: Thoracic aorta is normal in caliber. No pericardial effusion. Upper normal heart size. Mediastinum/Nodes: Small mediastinal lymph nodes not enlarged by size criteria. Limited assessment for hilar adenopathy given lack of IV contrast. The esophagus is decompressed. No visualized thyroid nodule. Lungs/Pleura: Mild smooth septal thickening. Scattered ill-defined small pulmonary nodules and  ground-glass opacities in both lungs, slight  dependent and lower lobe predominant. No cavitary component Fissural thickening/fluid in the fissure with trace bilateral pleural effusions. Trachea and mainstem bronchi are patent. Mild bronchial thickening in the lower lobes. Upper Abdomen: Hepatic steatosis. Assessed in full on dedicated abdominal CT earlier this day. Musculoskeletal: There are no acute or suspicious osseous abnormalities. IMPRESSION: 1. Mild septal thickening suspicious for pulmonary edema. Trace bilateral pleural effusions. 2. Scattered ill-defined tiny pulmonary nodules in conjunction with ground-glass opacities in both lungs. Favor atypical infectious or other inflammatory etiology, (not classic for COVID-19 pneumonia). Ground-glass opacity component may represent either edema or atypical infection. Consider follow-up CT after course of treatment to ensure resolution. . Electronically Signed   By: Keith Rake M.D.   On: 07/06/2019 19:33   Ct Abdomen Pelvis W Contrast  Result Date: 07/06/2019 CLINICAL DATA:  Fever, headache, nausea, vomiting, abdominal pain EXAM: CT ABDOMEN AND PELVIS WITH CONTRAST TECHNIQUE: Multidetector CT imaging of the abdomen and pelvis was performed using the standard protocol following bolus administration of intravenous contrast. CONTRAST:  159m OMNIPAQUE IOHEXOL 300 MG/ML  SOLN COMPARISON:  None. FINDINGS: Lower chest: No acute abnormality. Scattered small pulmonary nodules in the dependent bilateral lung bases with some evidence of septal thickening. Trace bilateral pleural effusions. Hepatobiliary: No solid liver abnormality is seen. Hepatic steatosis. No gallstones, gallbladder wall thickening, or biliary dilatation. Pancreas: Unremarkable. No pancreatic ductal dilatation or surrounding inflammatory changes. Spleen: Normal in size without significant abnormality. Adrenals/Urinary Tract: Adrenal glands are unremarkable. Kidneys are normal, without renal  calculi, solid lesion, or hydronephrosis. Bladder is unremarkable. Stomach/Bowel: Stomach is within normal limits. Appendix appears normal. No evidence of bowel wall thickening, distention, or inflammatory changes. Vascular/Lymphatic: No significant vascular findings are present. No enlarged abdominal or pelvic lymph nodes. Reproductive: No mass or other significant abnormality. Other: No abdominal wall hernia or abnormality. Trace ascites in the low abdomen (series 2, image 82). Musculoskeletal: No acute or significant osseous findings. IMPRESSION: 1. No definite CT findings of the abdomen or pelvis to explain symptoms or pain. 2. Trace ascites in the low abdomen, of uncertain etiology without obvious inflammatory findings. 3.  Hepatic steatosis. 4. Scattered small pulmonary nodules in the dependent bilateral lung bases with some evidence of septal thickening. Trace bilateral pleural effusions. Findings favor atypical infection and a possible component of pulmonary edema. Electronically Signed   By: AEddie CandleM.D.   On: 07/06/2019 17:27   Dg Chest Port 1 View  Result Date: 07/06/2019 CLINICAL DATA:  Fever with headache, nausea and vomiting 4 days. EXAM: PORTABLE CHEST 1 VIEW COMPARISON:  None. FINDINGS: Lungs are adequately inflated and otherwise clear. Cardiomediastinal silhouette and remainder of the exam is within normal. IMPRESSION: No active disease. Electronically Signed   By: DMarin OlpM.D.   On: 07/06/2019 15:52       Assessment & Plan:    Principal Problem:   Sepsis ([place] Active Problems:   Fever   Headache   Rash  Sepsis (fever, tachycardia, leukocytosis, elevated lactic acid) Blood culture x2 Start vanco iv, cefepime iv pharmacy to dose  Rash , Fever likely infection (toxic granulations) R/o meningitis (meningococcal) R/o RMSF (fever, headache, rash) Blood culture x2 Check ESR Check ANA Check Quantiferon gold Check RF Check RPR Check ANA RMSF titer Fluoroguided  lumbar puncture ordered, please make sure on schedule for today abx as above Start Doxycycline 1017miv bid to cover for RMSF    Abnormal liver function Check acute hepatitis panel, CPK Check RUQ ultrasound Check cmp  in am  Tachycardia Check TSH Check Cardiac echo (r/o endocarditis)  Back pain  Check MRI L spine (due to fever, of unclear source)  Microscopic hematuria Outpatient follow up please     DVT Prophylaxis-   Lovenox - SCDs   AM Labs Ordered, also please review Full Orders  Family Communication: Admission, patients condition and plan of care including tests being ordered have been discussed with the patient  who indicate understanding and agree with the plan and Code Status.  Code Status:  FULL CODE per patient,   Admission status: Inpatient: Based on patients clinical presentation and evaluation of above clinical data, I have made determination that patient meets Inpatient criteria at this time. Pt is critically ill, w sepsis of unclear source, pt will require iv abx, and iv hydration,  Pt will require > 2 nites stay.   Time spent in minutes : 70   Jani Gravel M.D on 07/07/2019 at 4:43 AM

## 2019-07-07 NOTE — Progress Notes (Signed)
  Echocardiogram 2D Echocardiogram has been performed.  Raymond Owens 07/07/2019, 3:18 PM

## 2019-07-07 NOTE — Progress Notes (Signed)
-  Patient was admitted earlier today. -Patient is a 28 year old Caucasian male with past medical history significant for obesity and hyperlipidemia. -Patient presented with 4-day history of fever, headache, recent sore throat and rash. 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 -Work-up is in progress. -Patient is septic. -Infectious disease team and critical care team consulted. -Further management will depend on hospital course.

## 2019-07-07 NOTE — Progress Notes (Signed)
Patient stated that he has had a rash on his back for the past 5 days ago. On assessment, the rash was found on back, both arms, chest and stomach. Rash is raised, red small circles. MD notified and will continue to monitor.

## 2019-07-07 NOTE — Progress Notes (Signed)
Infectious disease confirmed that patient can be taken off airborne/contact and moved to droplet precautions only. Precautions can be completely removed when patient has been on IV antibiotics for 24 hrs.

## 2019-07-07 NOTE — Consult Note (Signed)
° °NAME:  Alison Weigelt, MRN:  [id number], DOB:  02/25/1991, LOS: 0 °ADMISSION DATE:  07/06/2019, CONSULTATION DATE:  11/12 °REFERRING MD:  ogbata, CHIEF COMPLAINT:  sepsis  ° °Brief History   °28 year old male admitted 11/11 w/ fever, HA, stiff neck and rash.  ° °History of present illness   °28-year-old white male without significant medical history was in his usual state of health doing yard work On 11/7 blowing leaves and readying his home for winter.  On 11/8 developed headache and subjective fever.  On 11/9 symptoms were absent, on 11/10 again had headache and fever, this was refractory to antipyretics.  He woke up the morning of 11/11 with high fever, stiff neck body aches, and new diffuse macular rash over hands, legs, abdomen.  Also had intermittent rigors, and worsening shortness of breath.  He vomited in route to the emergency room.  Presented to the ER on 11/11 with the complaints as noted above initial evaluation found positive SIRS response.  CT chest abdomen pelvis obtained.  CT chest showing nodular bibasilar infiltrates with groundglass changes.  He is placed on broad-spectrum antibiotics including vancomycin, cefepime, and doxycycline additionally was placed on acyclovir.  Over the following 12 hours he had persistent fevers, worsening tachypnea, and worsening hypoxia going from room air to high flow supplemental oxygen because of this critical care was asked to evaluate. ° °Past Medical History  ° ° °Significant Hospital Events   °11/11 admitted. Started on broad spec abx °11/12 on-going fever, tachycardia, tachypnea. SIRS response. BP stable. PCCM asked to eval given concern about clinical decline. Increased over night from room air to HFNC. ABG w/ progressive hypoxia and resp alk.  °Consults:  °Ccm °IR  °Infectious disease  ° °Procedures:  ° ° °Significant Diagnostic Tests:  °ANA 11/13>>> °Antistreptolysin O titer 11/12>>> °RF 11/12>>> °11/11 (inflammatory labs): DDimer: 1.75, PCT 3.33, LDH 272,  ferritin 494, fibrinogen > 800, CRP 27.3 sed rate 77 °abd US 11/12: Increased echogenicity of hepatic parenchyma is noted consistent with hepatic steatosis. 6 cm anechoic structure is noted within the left hepatic lobe that is not visualized on CT scan performed the previous day; etiology is unknown, but abscess cannot be excluded lthough unlikely.  °11/11 CT head: negative °11/11 CT chest abd and pelvis: 1. Mild septal thickening suspicious for pulmonary edema. Trace bilateral pleural effusions.2. Scattered ill-defined tiny pulmonary nodules in conjunction with ground-glass opacities in both lungs.  °1. No definite CT findings of the abdomen or pelvis to explain °symptoms or pain. 2. Trace ascites in the low abdomen, of uncertain etiology without obvious inflammatory findings. °3.  Hepatic steatosis. °Micro/infection Data:  °CSF cryptococcal antigen (P 11/11)>>> °CSF cell count (P 11/11)>>> °HSV by PCR (P 11/11)>>> °Protein and glucose from CSF (P 11/11)>>> °quantiferon gold 11/13>>> °[place] spotted fever abs panel 11/12>>> °RPR 11/11>>> °BCX2 11/11>>> °COVID 19 11/11: negative  °UC 11/11>>> °HIV ab 11/11>>>NR °RVP 11/11: negative  °Antimicrobials:  °Cefepime 11/11>>> °Doxy 11/11>>> °vanc 11/11>>> °Acyclovir 11/11>>> ° °Interim history/subjective:  °Feels about the same  ° °Objective   °Blood pressure (Abnormal) 141/70, pulse (Abnormal) 104, temperature (Abnormal) 104.2 °F (40.1 °C), temperature source Rectal, resp. rate (Abnormal) 43, height 5' 11" (1.803 m), weight 111 kg, SpO2 92 %. °   °   ° °Intake/Output Summary (Last 24 hours) at 07/07/2019 0947 °Last data filed at 07/07/2019 0800 °Gross per 24 hour  °Intake 1645.78 ml  °Output 360 ml  °Net 1285.78 ml  ° °Filed Weights  °   07/06/19 1513 07/07/19 0359  °Weight: 106.6 kg 111 kg  ° ° °Examination: °General: 28 year old white male resting in bed. Breathing labored but not in distress currently  °HENT: NCAT no JVD sclera not icteric  °Lungs: clear diminished  tachypneic °Cardiovascular: Tachycardic regular rhythm °Abdomen: Soft not tender no organomegaly °Extremities: Warm and dry brisk capillary refill macular rash scattered over legs, hands, and also on palms °Neuro: Awake oriented has stiff neck but no focal deficits motor wise he is completely oriented °GU: Voids ° °Resolved Hospital Problem list   ° ° °Assessment & Plan:  ° °SIRS/sepsis °-Etiology unclear.  Primary concern would be tickborne illness such as [place] spotted fever or perhaps meningitis/CNS infection °-Has been seen by infectious disease °Plan °Continue ICU monitoring °30 mL/kg IV fluid bolus, he has had high fever and we need to catch up with insensible loss °Serial lactic acid °Day #2 antibiotics, current regimen will include vancomycin, doxycycline and ceftriaxone.  We will discontinue acyclovir and cefepime per discussion with infectious disease °MRI has been ordered °Awaiting lumbar puncture °Autoimmune tests sent ° °Acute hypoxic respiratory failure in the setting of bilateral right greater than left pulmonary infiltrates °Initial CT scan showed nodular bibasilar airspace disease with groundglass changes, etiology unclear °He is at risk for acute lung injury °Plan °Supplemental oxygen °Pulse oximetry °If he were to require intubation would consider bronchoscopy ° °Hypokalemia  °Plan °Replace and recheck  ° °Best practice:  °Diet: npo except meds °Pain/Anxiety/Delirium protocol (if indicated): NA °VAP protocol (if indicated): NA °DVT prophylaxis: scd °GI prophylaxis: NA °Glucose control: NA °Mobility: BR °Code Status: full code °Family Communication: pending  °Disposition:  ° °Labs   °CBC: °Recent Labs  °Lab 07/06/19 °1524 07/07/19 °0546  °WBC 28.7* 26.7*  °NEUTROABS 25.2*  --   °HGB 14.4 13.1  °HCT 42.4 39.0  °MCV 87.1 89.4  °PLT 234 240  ° ° °Basic Metabolic Panel: °Recent Labs  °Lab 07/06/19 °1524 07/06/19 °1526 07/07/19 °0546  °NA 132*  --  134*  °K 2.9*  --  3.1*  °CL 97*  --  100  °CO2  22  --  22  °GLUCOSE 144*  --  126*  °BUN 19  --  15  °CREATININE 0.86  --  0.89  °CALCIUM 8.7*  --  7.8*  °MG  --  1.7  --   ° °GFR: °Estimated Creatinine Clearance: 158 mL/min (by C-G formula based on SCr of 0.89 mg/dL). °Recent Labs  °Lab 07/06/19 °1524 07/06/19 °1525 07/06/19 °1735 07/06/19 °2132 07/07/19 °0546  °PROCALCITON  --   --   --  3.33  --   °WBC 28.7*  --   --   --  26.7*  °LATICACIDVEN  --  3.3* 2.3* 2.2*  --   ° ° °Liver Function Tests: °Recent Labs  °Lab 07/06/19 °1524 07/07/19 °0546  °AST 45* 27  °ALT 54* 46*  °ALKPHOS 75 88  °BILITOT 0.9 1.3*  °PROT 7.8 6.8  °ALBUMIN 3.5 3.1*  ° °No results for input(s): LIPASE, AMYLASE in the last 168 hours. °No results for input(s): AMMONIA in the last 168 hours. ° °ABG °No results found for: PHART, PCO2ART, PO2ART, HCO3, TCO2, ACIDBASEDEF, O2SAT  ° °Coagulation Profile: °Recent Labs  °Lab 07/06/19 °1524  °INR 1.4*  ° ° °Cardiac Enzymes: °Recent Labs  °Lab 07/07/19 °0546  °CKTOTAL 89  °CKMB 2.7  ° ° °HbA1C: °No results found for: HGBA1C ° °CBG: °No results for input(s): GLUCAP in the last   168 hours.  Review of Systems:   Review of Systems  Constitutional: Positive for chills, diaphoresis, fever and malaise/fatigue.  HENT: Negative.   Eyes: Negative.   Respiratory: Positive for shortness of breath.   Cardiovascular: Negative.   Gastrointestinal: Negative.   Genitourinary: Negative.   Musculoskeletal: Positive for joint pain, myalgias and neck pain.  Skin: Positive for rash.  Neurological: Positive for headaches.  Endo/Heme/Allergies: Negative.   Psychiatric/Behavioral: Negative.      Past Medical History  He,  has a past medical history of Hyperlipidemia.   Surgical History   History reviewed. No pertinent surgical history.   Social History   reports that he quit smoking about 7 years ago. He has never used smokeless tobacco. He reports current alcohol use. He reports that he does not use drugs.   Family History   His family history  includes Cancer in his maternal grandfather, maternal grandmother, paternal grandfather, and paternal grandmother; Diabetes in his mother; Heart attack in his maternal uncle.   Allergies Allergies  Allergen Reactions   Penicillins Rash    Did it involve swelling of the face/tongue/throat, SOB, or low BP? No Did it involve sudden or severe rash/hives, skin peeling, or any reaction on the inside of your mouth or nose? Yes Did you need to seek medical attention at a hospital or doctor's office? No When did it last happen?unknown If all above answers are NO, may proceed with cephalosporin use.      Home Medications  Prior to Admission medications   Medication Sig Start Date End Date Taking? Authorizing Provider  acetaminophen (TYLENOL) 500 MG tablet Take 1,000 mg by mouth every 6 (six) hours as needed for mild pain, moderate pain or headache.   Yes [provider]  ibuprofen (ADVIL) 200 MG tablet Take 400 mg by mouth every 6 (six) hours as needed for headache, mild pain, moderate pain or cramping.   Yes [provider]  cyclobenzaprine (FLEXERIL) 10 MG tablet Take 1 tablet (10 mg total) by mouth 3 (three) times daily as needed for muscle spasms. Patient not taking: Reported on 07/07/2019 05/25/19   Donella Stade, PA-C  diclofenac sodium (VOLTAREN) 1 % GEL Apply 4 g topically 4 (four) times daily. To affected joint. Patient not taking: Reported on 07/07/2019 05/25/19   Donella Stade, PA-C  meloxicam (MOBIC) 15 MG tablet Take 1 tablet (15 mg total) by mouth daily. Patient not taking: Reported on 07/07/2019 05/25/19   Donella Stade, PA-C     Critical care time: 33 minutes    Erick Colace ACNP-BC [place] Pager # [phone] OR # [phone] if no answer

## 2019-07-08 ENCOUNTER — Inpatient Hospital Stay (HOSPITAL_COMMUNITY): Payer: 59

## 2019-07-08 DIAGNOSIS — R609 Edema, unspecified: Secondary | ICD-10-CM

## 2019-07-08 DIAGNOSIS — G009 Bacterial meningitis, unspecified: Secondary | ICD-10-CM | POA: Diagnosis present

## 2019-07-08 DIAGNOSIS — J9601 Acute respiratory failure with hypoxia: Secondary | ICD-10-CM

## 2019-07-08 DIAGNOSIS — R7989 Other specified abnormal findings of blood chemistry: Secondary | ICD-10-CM

## 2019-07-08 LAB — CBC WITH DIFFERENTIAL/PLATELET
Abs Immature Granulocytes: 0.92 10*3/uL — ABNORMAL HIGH (ref 0.00–0.07)
Basophils Absolute: 0.1 10*3/uL (ref 0.0–0.1)
Basophils Relative: 0 %
Eosinophils Absolute: 0 10*3/uL (ref 0.0–0.5)
Eosinophils Relative: 0 %
HCT: 35.7 % — ABNORMAL LOW (ref 39.0–52.0)
Hemoglobin: 12.1 g/dL — ABNORMAL LOW (ref 13.0–17.0)
Immature Granulocytes: 4 %
Lymphocytes Relative: 5 %
Lymphs Abs: 1.4 10*3/uL (ref 0.7–4.0)
MCH: 30 pg (ref 26.0–34.0)
MCHC: 33.9 g/dL (ref 30.0–36.0)
MCV: 88.4 fL (ref 80.0–100.0)
Monocytes Absolute: 1.8 10*3/uL — ABNORMAL HIGH (ref 0.1–1.0)
Monocytes Relative: 7 %
Neutro Abs: 22.1 10*3/uL — ABNORMAL HIGH (ref 1.7–7.7)
Neutrophils Relative %: 84 %
Platelets: 240 10*3/uL (ref 150–400)
RBC: 4.04 MIL/uL — ABNORMAL LOW (ref 4.22–5.81)
RDW: 13.7 % (ref 11.5–15.5)
WBC: 26.2 10*3/uL — ABNORMAL HIGH (ref 4.0–10.5)
nRBC: 0 % (ref 0.0–0.2)

## 2019-07-08 LAB — RENAL FUNCTION PANEL
Albumin: 2.4 g/dL — ABNORMAL LOW (ref 3.5–5.0)
Anion gap: 10 (ref 5–15)
BUN: 15 mg/dL (ref 6–20)
CO2: 22 mmol/L (ref 22–32)
Calcium: 8 mg/dL — ABNORMAL LOW (ref 8.9–10.3)
Chloride: 104 mmol/L (ref 98–111)
Creatinine, Ser: 0.74 mg/dL (ref 0.61–1.24)
GFR calc Af Amer: >60 mL/min (ref >60)
GFR calc non Af Amer: >60 mL/min (ref >60)
Glucose, Bld: 100 mg/dL — ABNORMAL HIGH (ref 70–99)
Phosphorus: 1.8 mg/dL — ABNORMAL LOW (ref 2.5–4.6)
Potassium: 3.2 mmol/L — ABNORMAL LOW (ref 3.5–5.1)
Sodium: 136 mmol/L (ref 135–145)

## 2019-07-08 LAB — RPR: RPR Ser Ql: NONREACTIVE

## 2019-07-08 LAB — HIV-1 RNA QUANT-NO REFLEX-BLD
HIV 1 RNA Quant: <20 copies/mL
LOG10 HIV-1 RNA: UNDETERMINED log10copy/mL

## 2019-07-08 LAB — URINE CULTURE: Culture: NO GROWTH

## 2019-07-08 LAB — PATHOLOGIST SMEAR REVIEW

## 2019-07-08 LAB — ANTISTREPTOLYSIN O TITER: ASO: 374 IU/mL — ABNORMAL HIGH (ref 0.0–200.0)

## 2019-07-08 LAB — EHRLICHIA ANTIBODY PANEL
E chaffeensis (HGE) Ab, IgG: NEGATIVE
E chaffeensis (HGE) Ab, IgM: NEGATIVE
E. Chaffeensis (HME) IgM Titer: NEGATIVE
E.Chaffeensis (HME) IgG: NEGATIVE

## 2019-07-08 LAB — STREP PNEUMONIAE URINARY ANTIGEN: Strep Pneumo Urinary Antigen: NEGATIVE

## 2019-07-08 LAB — D-DIMER, QUANTITATIVE: D-Dimer, Quant: 3.06 ug/mL-FEU — ABNORMAL HIGH (ref 0.00–0.50)

## 2019-07-08 LAB — RHEUMATOID FACTOR: Rheumatoid fact SerPl-aCnc: 18.9 IU/mL — ABNORMAL HIGH (ref 0.0–13.9)

## 2019-07-08 LAB — MAGNESIUM: Magnesium: 1.9 mg/dL (ref 1.7–2.4)

## 2019-07-08 LAB — PROCALCITONIN: Procalcitonin: 3.4 ng/mL

## 2019-07-08 LAB — ANA: Anti Nuclear Antibody (ANA): NEGATIVE

## 2019-07-08 IMAGING — CT CT ANGIO CHEST
2 of 6 series · 18 of 46 positions shown · IV contrast (APPLIED)
Comparison: [DATE]

CLINICAL DATA: Elevated D-dimer, hypoxia, tachycardia

EXAM:
CT ANGIOGRAPHY CHEST WITH CONTRAST
TECHNIQUE: Multidetector CT imaging of the chest was performed using the
standard protocol during bolus administration of intravenous
contrast. Multiplanar CT image reconstructions and MIPs were
obtained to evaluate the vascular anatomy.
CONTRAST:  100mL OMNIPAQUE IOHEXOL 350 MG/ML SOLN

[Series 6: thins · axial · 0.71mm/px · z∈[-272,+23]mm · 16 of 325 slices shown]
[im 15/325  lung]
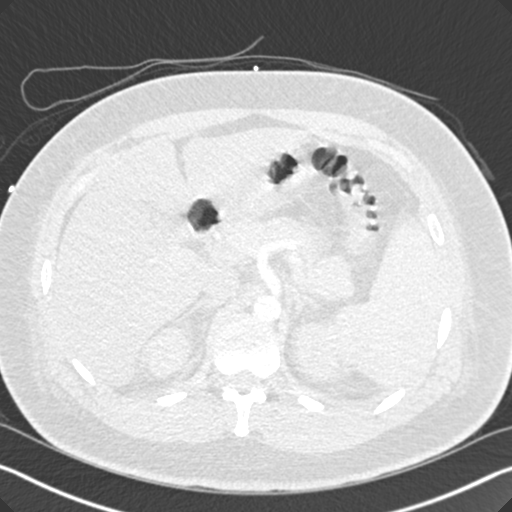
[im 43/325  soft-tissue]
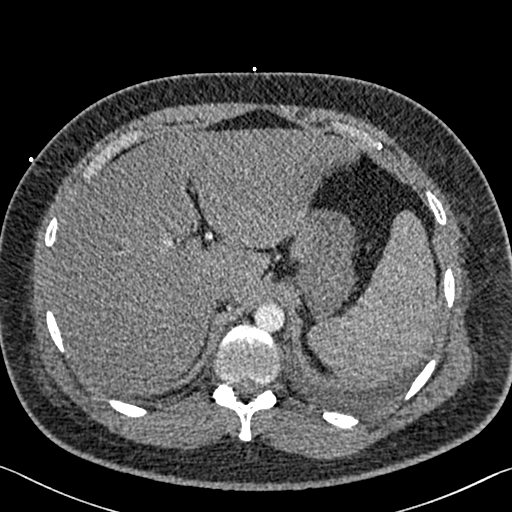
[im 57/325  lung]
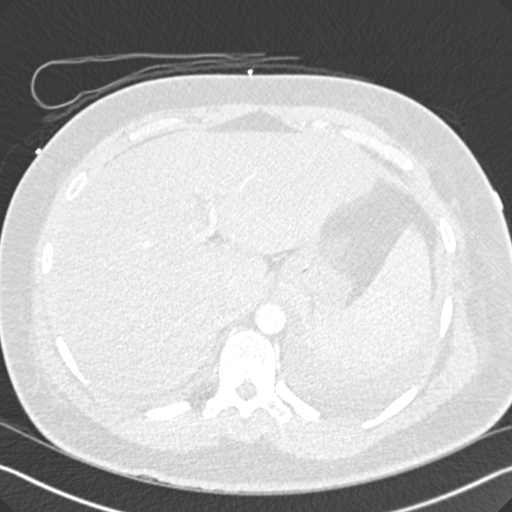
[im 71/325  soft-tissue]
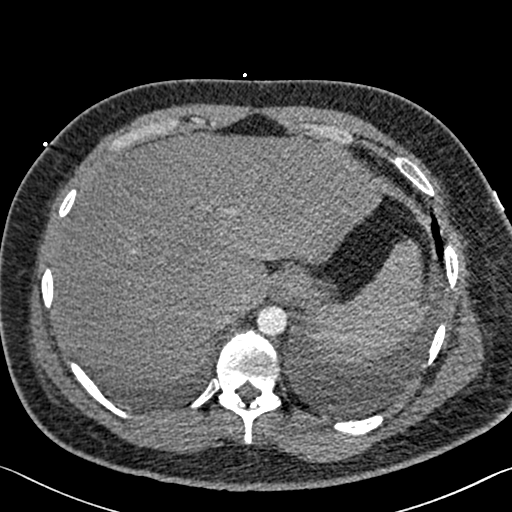
[im 99/325  lung]
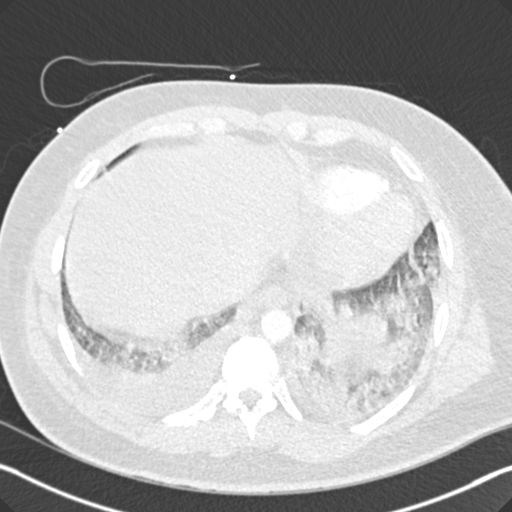
[im 113/325  soft-tissue]
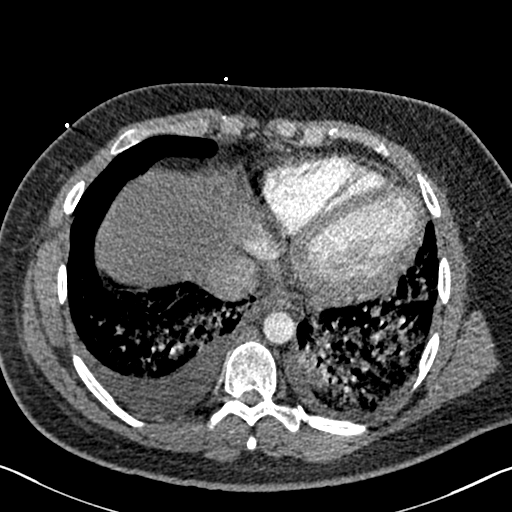
[im 127/325  lung]
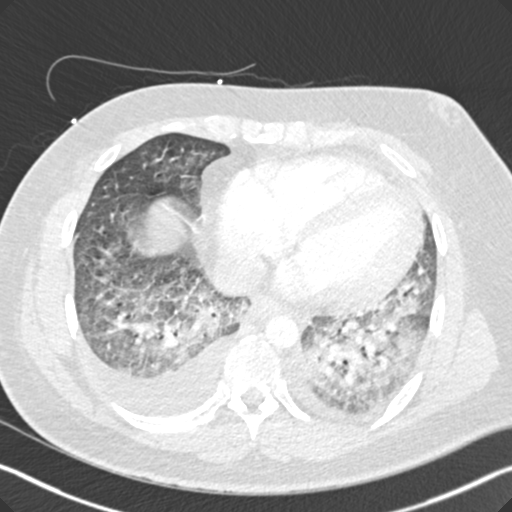
[im 155/325  soft-tissue]
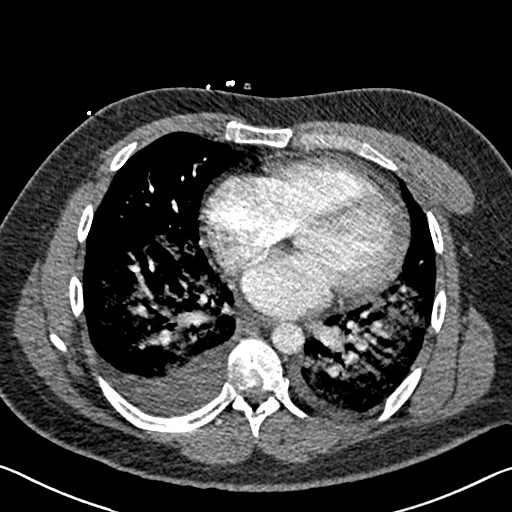
[im 170/325  lung]
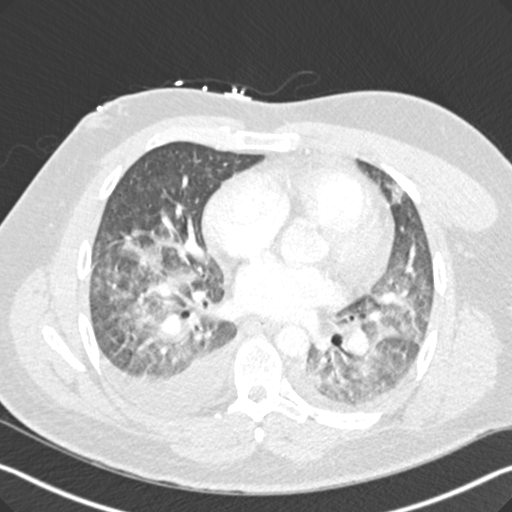
[im 198/325  soft-tissue]
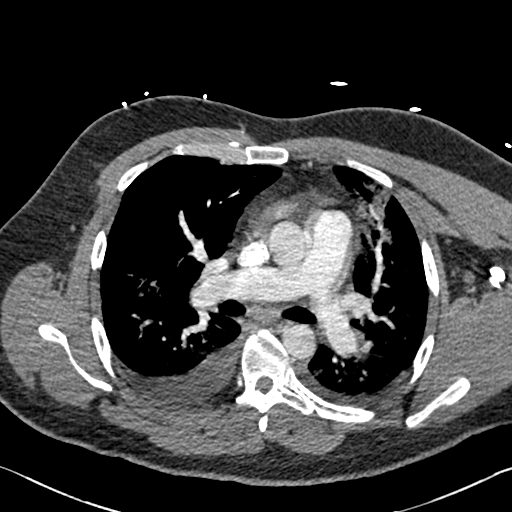
[im 212/325  lung]
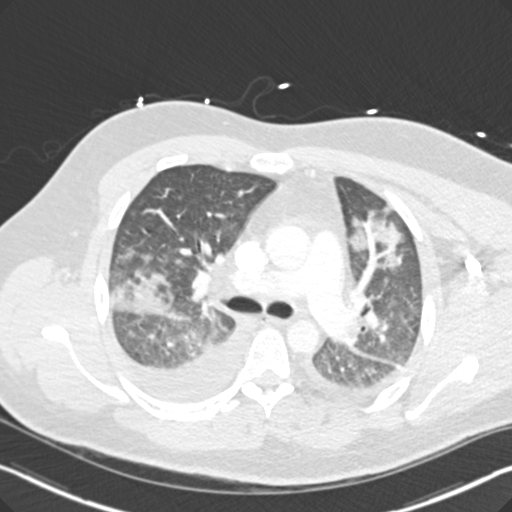
[im 226/325  soft-tissue]
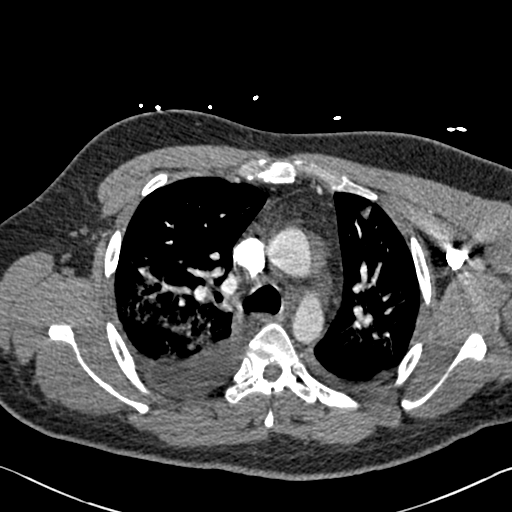
[im 254/325  lung]
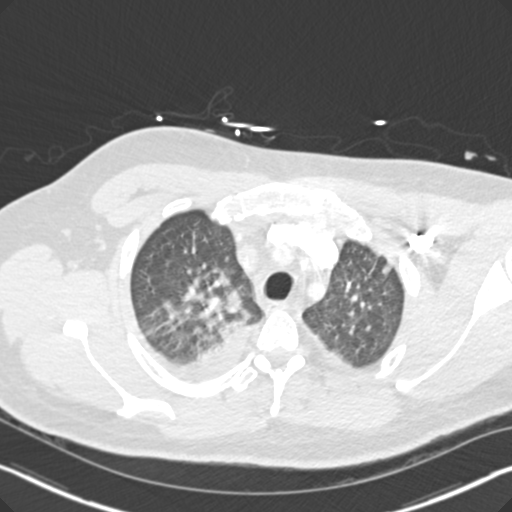
[im 268/325  soft-tissue]
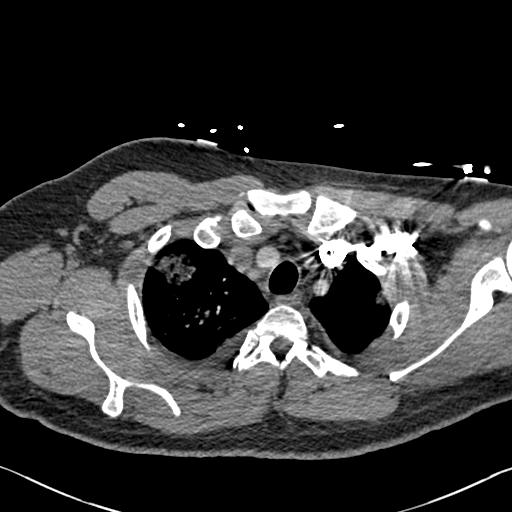
[im 282/325  lung]
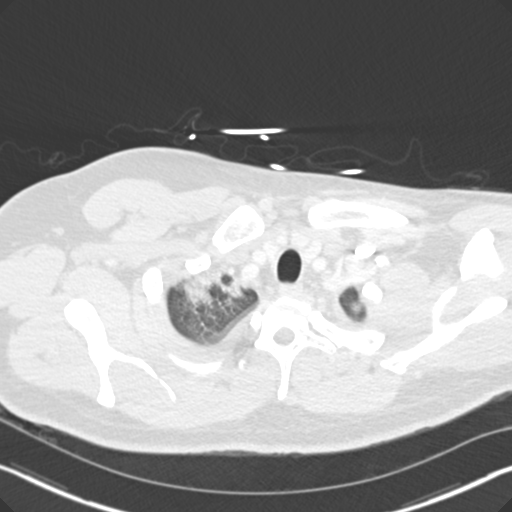
[im 310/325  soft-tissue]
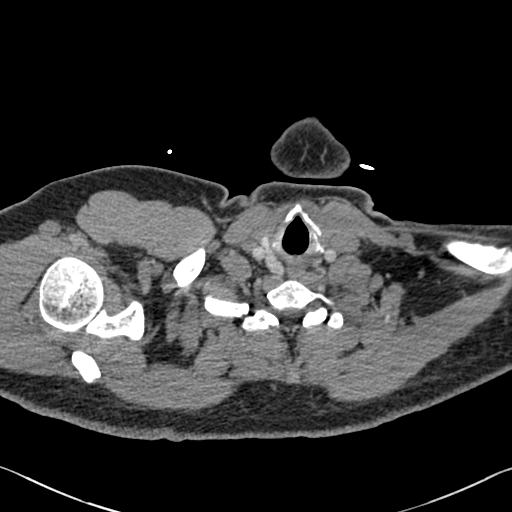

[Series 7: coronal mpr · coronal · 0.64mm/px · 2 of 96 slices shown]
[im 32/96  soft-tissue]
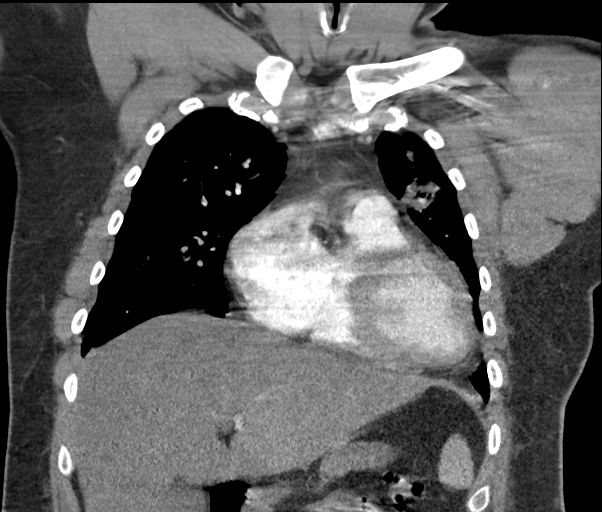
[im 64/96  soft-tissue]
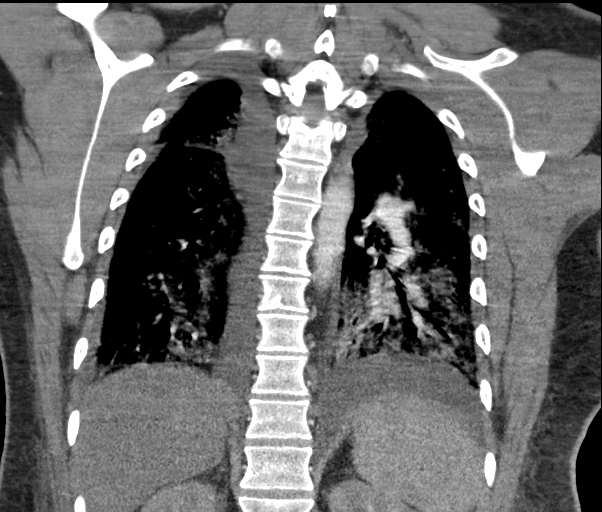

[18 of 46 positions shown; findings below may reference images not displayed]

FINDINGS: Cardiovascular: Examination is somewhat limited by breath motion
artifact, particularly in the bilateral lower lobes. Within this
limitation, there is no evidence of pulmonary embolism through the
segmental pulmonary arterial level. Mild cardiomegaly. Trace
pericardial effusion.

Mediastinum/Nodes: No enlarged mediastinal, hilar, or axillary lymph
nodes. Thyroid gland, trachea, and esophagus demonstrate no
significant findings.

Lungs/Pleura: Extensive bilateral ground-glass and consolidative
airspace opacity, which is new compared to prior examination.
Diffuse interlobular septal thickening, similar to prior
examination. Small bilateral pleural effusions, new compared to
prior exam.

Upper Abdomen: No acute abnormality.  Hepatic steatosis.

Musculoskeletal: No chest wall abnormality. No acute or significant
osseous findings.

Review of the MIP images confirms the above findings.
IMPRESSION: 1. Examination is somewhat limited by breath motion artifact,
particularly in the bilateral lower lobes. Within this limitation,
there is no evidence of pulmonary embolism through the segmental
pulmonary arterial level.

2. Extensive bilateral ground-glass and consolidative airspace
opacity, which is new compared to prior examination. Findings are
consistent with multifocal infection.

3. Diffuse interlobular septal thickening, similar to prior
examination, and consistent with pulmonary edema.

4. Small bilateral pleural effusions, new compared to prior
examination.

5.  Cardiomegaly and trace pericardial effusion.

## 2019-07-08 MED ORDER — LOPERAMIDE HCL 2 MG PO CAPS
2.0000 mg | ORAL_CAPSULE | ORAL | Status: DC | PRN
  Administered 2019-07-08 – 2019-07-10 (×4): 2 mg via ORAL
  Filled 2019-07-08 (×4): qty 1

## 2019-07-08 MED ORDER — SIMETHICONE 80 MG PO CHEW: 80.0000 mg | CHEWABLE_TABLET | Freq: Four times a day (QID) | ORAL | Status: DC | PRN

## 2019-07-08 MED ORDER — FAMOTIDINE 20 MG PO TABS
20.0000 mg | ORAL_TABLET | Freq: Two times a day (BID) | ORAL | Status: DC
  Administered 2019-07-08 – 2019-07-14 (×13): 20 mg via ORAL
  Filled 2019-07-08 (×13): qty 1

## 2019-07-08 MED ORDER — K PHOS MONO-SOD PHOS DI & MONO 155-852-130 MG PO TABS
250.0000 mg | ORAL_TABLET | Freq: Two times a day (BID) | ORAL | Status: DC
  Administered 2019-07-08 – 2019-07-09 (×3): 250 mg via ORAL
  Filled 2019-07-08 (×4): qty 1

## 2019-07-08 MED ORDER — GERHARDT'S BUTT CREAM
1.0000 "application " | TOPICAL_CREAM | Freq: Three times a day (TID) | CUTANEOUS | Status: DC
  Administered 2019-07-08 – 2019-07-14 (×18): 1 via TOPICAL
  Filled 2019-07-08 (×4): qty 1

## 2019-07-08 MED ORDER — SODIUM CHLORIDE 0.9% FLUSH
10.0000 mL | Freq: Two times a day (BID) | INTRAVENOUS | Status: DC
  Administered 2019-07-08 – 2019-07-14 (×12): 10 mL

## 2019-07-08 MED ORDER — IOHEXOL 350 MG/ML SOLN
100.0000 mL | Freq: Once | INTRAVENOUS | Status: AC | PRN
  Administered 2019-07-08: 100 mL via INTRAVENOUS

## 2019-07-08 MED ORDER — SODIUM CHLORIDE 0.9% FLUSH: 10.0000 mL | INTRAVENOUS | Status: DC | PRN

## 2019-07-08 MED ORDER — HYDRALAZINE HCL 20 MG/ML IJ SOLN
5.0000 mg | Freq: Four times a day (QID) | INTRAMUSCULAR | Status: DC | PRN
  Administered 2019-07-08: 5 mg via INTRAVENOUS
  Filled 2019-07-08 (×2): qty 1

## 2019-07-08 MED ORDER — SODIUM CHLORIDE 0.9 % IV BOLUS
500.0000 mL | Freq: Once | INTRAVENOUS | Status: AC
  Administered 2019-07-08: 500 mL via INTRAVENOUS

## 2019-07-08 MED ORDER — SODIUM CHLORIDE 0.9 % IV SOLN: INTRAVENOUS | Status: DC

## 2019-07-08 MED ORDER — POTASSIUM CHLORIDE CRYS ER 20 MEQ PO TBCR
40.0000 meq | EXTENDED_RELEASE_TABLET | Freq: Four times a day (QID) | ORAL | Status: AC
  Administered 2019-07-08 (×2): 40 meq via ORAL
  Filled 2019-07-08 (×2): qty 2

## 2019-07-08 MED ORDER — METOPROLOL TARTRATE 5 MG/5ML IV SOLN
5.0000 mg | INTRAVENOUS | Status: AC | PRN
  Administered 2019-07-08 – 2019-07-09 (×2): 5 mg via INTRAVENOUS
  Filled 2019-07-08 (×2): qty 5

## 2019-07-08 NOTE — Progress Notes (Signed)
PROGRESS NOTE                                                                                                                                                                                                             Patient Demographics:    Raymond Owens, is a 28 y.o. male, DOB - Aug 18, 1991, MHD:[id number]  Admit date - 07/06/2019   Admitting Physician Therisa Doyne, MD  Outpatient Primary MD for the patient is Jomarie Longs, PA-C  LOS - 1   Chief Complaint  Patient presents with   Fever       Brief Narrative    28 years old male without significant past medical history, presents with sepsis, work-up significant for bacterial meningitis, versus possibly RMSF, appears to be improving on vancomycin, Rocephin and doxycycline.   Subjective:    [name] today still having low-grade fevers, reports generalized weakness and fatigue, complains of headache and photophobia .   Assessment  & Plan :    Principal Problem:   Bacterial meningitis Active Problems:   Sepsis (HCC)   Fever   Headache   Rash   Sepsis -Is most likely due to bacterial meningitis, as his lumbar puncture with >400 WBC, PMN predominance, he does complain of photophobia, headache, typical findings with bacterial meningitis, continue with IV vancomycin and IV Rocephin. -Possible RMSF, continue with IV doxycycline, the input greatly appreciated,negative RMSF serologies WILL NOT exclude this diagnosis.  Lab Results  Component Value Date   SARSCOV2NAA NEGATIVE 07/06/2019   Hypokalemia/hypophosphatemia -Repleted, recheck in a.m.  Acute hypoxic respiratory failure -CT findings suspicious for atypical pneumonia, he remains on 5 L nasal cannula this morning, he is on broad-spectrum antibiotic coverage. -Was encouraged to use incentive spirometry. -Decrease his IV fluids  Elevated D-dimers -This is most likely in the setting of sepsis, will obtain venous Doppler,  and repeat level today, if remains elevated with negative Dopplers will obtain CTA chest.   Code Status : Full  Family Communication  : D/W wife via phone  Disposition Plan  : Home  Barriers For Discharge : Remains septic, on broad-spectrum IV antibiotics  Consults  :  ID, PCCM  Procedures  : LP 11/12  DVT Prophylaxis  :  SCD  Lab Results  Component Value Date   PLT 240 07/08/2019    Antibiotics  :  Anti-infectives (From admission, onward)   Start     Dose/Rate Route Frequency Ordered Stop   07/07/19 1600  vancomycin (VANCOCIN) 1,750 mg in sodium chloride 0.9 % 500 mL IVPB  Status:  Discontinued     1,750 mg 250 mL/hr over 120 Minutes Intravenous Every 12 hours 07/07/19 0605 07/07/19 1120   07/07/19 1600  cefTRIAXone (ROCEPHIN) 2 g in sodium chloride 0.9 % 100 mL IVPB     2 g 200 mL/hr over 30 Minutes Intravenous Every 12 hours 07/07/19 1108     07/07/19 1200  vancomycin (VANCOCIN) IVPB 1000 mg/200 mL premix     1,000 mg 200 mL/hr over 60 Minutes Intravenous Every 8 hours 07/07/19 1120     07/07/19 0445  doxycycline (VIBRAMYCIN) 100 mg in sodium chloride 0.9 % 250 mL IVPB     100 mg 125 mL/hr over 120 Minutes Intravenous Every 12 hours 07/07/19 0440     07/07/19 0400  vancomycin (VANCOCIN) 1,750 mg in sodium chloride 0.9 % 500 mL IVPB  Status:  Discontinued     1,750 mg 250 mL/hr over 120 Minutes Intravenous Every 8 hours 07/06/19 1605 07/06/19 2122   07/07/19 0200  vancomycin (VANCOCIN) 1,500 mg in sodium chloride 0.9 % 500 mL IVPB  Status:  Discontinued     1,500 mg 250 mL/hr over 120 Minutes Intravenous Every 8 hours 07/06/19 2122 07/07/19 0605   07/07/19 0000  ceFEPIme (MAXIPIME) 2 g in sodium chloride 0.9 % 100 mL IVPB  Status:  Discontinued     2 g 200 mL/hr over 30 Minutes Intravenous Every 8 hours 07/06/19 1606 07/07/19 1108   07/06/19 2230  acyclovir (ZOVIRAX) 750 mg in dextrose 5 % 150 mL IVPB  Status:  Discontinued     750 mg 165 mL/hr over 60 Minutes  Intravenous Every 8 hours 07/06/19 2217 07/07/19 1108   07/06/19 1700  vancomycin (VANCOCIN) IVPB 1000 mg/200 mL premix     1,000 mg 200 mL/hr over 60 Minutes Intravenous Every 1 hr x 2 07/06/19 1603 07/06/19 1828   07/06/19 1600  ceFEPIme (MAXIPIME) 2 g in sodium chloride 0.9 % 100 mL IVPB     2 g 200 mL/hr over 30 Minutes Intravenous  Once 07/06/19 1547 07/06/19 1644   07/06/19 1600  metroNIDAZOLE (FLAGYL) IVPB 500 mg     500 mg 100 mL/hr over 60 Minutes Intravenous  Once 07/06/19 1547 07/06/19 1936   07/06/19 1600  vancomycin (VANCOCIN) IVPB 1000 mg/200 mL premix  Status:  Discontinued     1,000 mg 200 mL/hr over 60 Minutes Intravenous  Once 07/06/19 1547 07/06/19 1603        Objective:   Vitals:   07/08/19 0800 07/08/19 0832 07/08/19 0900 07/08/19 1000  BP: (!) 153/90  133/84 (!) 141/118  Pulse: (!) 111  (!) 125 (!) 116  Resp: 13  18 (!) 24  Temp:  (!) 100.4 F (38 C)    TempSrc:  Oral    SpO2: 90%  95% 99%  Weight:      Height:        Wt Readings from Last 3 Encounters:  07/08/19 115.6 kg  05/25/19 112.5 kg  05/20/18 103.4 kg     Intake/Output Summary (Last 24 hours) at 07/08/2019 1119 Last data filed at 07/08/2019 1000 Gross per 24 hour  Intake 5385.13 ml  Output 2101 ml  Net 3284.13 ml     Physical Exam  Awake Alert, Oriented X 3, No new F.N deficits, Normal  affect,sitting recliner, in mild discomfort Symmetrical Chest wall movement, Good air movement bilaterally, CTAB RRR,No Gallops,Rubs or new Murmurs, No Parasternal Heave +ve B.Sounds, Abd Soft, No tenderness,  No rebound - guarding or rigidity. No Cyanosis, Clubbing or edema, No new Rash or bruise      Data Review:    CBC Recent Labs  Lab 07/06/19 1524 07/07/19 0546 07/08/19 0258  WBC 28.7* 26.7* 26.2*  HGB 14.4 13.1 12.1*  HCT 42.4 39.0 35.7*  PLT 234 240 240  MCV 87.1 89.4 88.4  MCH 29.6 30.0 30.0  MCHC 34.0 33.6 33.9  RDW 13.0 13.6 13.7  LYMPHSABS 1.0  --  1.4  MONOABS 1.7*  --   1.8*  EOSABS 0.0  --  0.0  BASOSABS 0.1  --  0.1    Chemistries  Recent Labs  Lab 07/06/19 1524 07/06/19 1526 07/07/19 0546 07/08/19 0258  NA 132*  --  134* 136  K 2.9*  --  3.1* 3.2*  CL 97*  --  100 104  CO2 22  --  22 22  GLUCOSE 144*  --  126* 100*  BUN 19  --  15 15  CREATININE 0.86  --  0.89 0.74  CALCIUM 8.7*  --  7.8* 8.0*  MG  --  1.7  --  1.9  AST 45*  --  27  --   ALT 54*  --  46*  --   ALKPHOS 75  --  88  --   BILITOT 0.9  --  1.3*  --    ------------------------------------------------------------------------------------------------------------------ Recent Labs    07/06/19 2132  TRIG 80    No results found for: HGBA1C ------------------------------------------------------------------------------------------------------------------ Recent Labs    07/07/19 0546  TSH 0.682   ------------------------------------------------------------------------------------------------------------------ Recent Labs    07/06/19 2132  FERRITIN 494*    Coagulation profile Recent Labs  Lab 07/06/19 1524  INR 1.4*    Recent Labs    07/06/19 2132  DDIMER 1.75*    Cardiac Enzymes Recent Labs  Lab 07/07/19 0546  CKMB 2.7   ------------------------------------------------------------------------------------------------------------------ No results found for: BNP  Inpatient Medications  Scheduled Meds:  chlorhexidine  15 mL Mouth Rinse BID   Chlorhexidine Gluconate Cloth  6 each Topical Daily   mouth rinse  15 mL Mouth Rinse q12n4p   phosphorus  250 mg Oral BID   potassium chloride  40 mEq Oral Q6H   Continuous Infusions:  sodium chloride Stopped (07/07/19 0438)   sodium chloride Stopped (07/08/19 0009)   cefTRIAXone (ROCEPHIN)  IV Stopped (07/08/19 0402)   doxycycline (VIBRAMYCIN) IV Stopped (07/08/19 1610)   lactated ringers 100 mL/hr at 07/08/19 1000   vancomycin Stopped (07/08/19 0419)   PRN Meds:.sodium chloride, sodium chloride,  acetaminophen **OR** acetaminophen, hydrALAZINE, ondansetron (ZOFRAN) IV  Micro Results Recent Results (from the past 240 hour(s))  Culture, blood (Routine x 2)     Status: None (Preliminary result)   Collection Time: 07/06/19  3:25 PM   Specimen: BLOOD  Result Value Ref Range Status   Specimen Description   Final    BLOOD RIGHT ANTECUBITAL Performed at [facility], [address] [place], [place] [address]    Special Requests   Final    BOTTLES DRAWN AEROBIC AND ANAEROBIC Blood Culture adequate volume Performed at [facility], [address] [place], [place] [address]    Culture   Final    NO GROWTH < 12 HOURS Performed at [facility], [address] [place], [place] [address]    Report Status PENDING  Incomplete  SARS CORONAVIRUS 2 (TAT 6-24 HRS) Nasopharyngeal Nasopharyngeal Swab     Status: None   Collection Time: 07/06/19  3:39 PM   Specimen: Nasopharyngeal Swab  Result Value Ref Range Status   SARS Coronavirus 2 NEGATIVE NEGATIVE Final    Comment: (NOTE) SARS-CoV-2 target nucleic acids are NOT DETECTED. The SARS-CoV-2 RNA is generally detectable in upper and lower respiratory specimens during the acute phase of infection. Negative results do not preclude SARS-CoV-2 infection, do not rule out co-infections with other pathogens, and should not be used as the sole basis for treatment or other patient management decisions. Negative results must be combined with clinical observations, patient history, and epidemiological information. The expected result is Negative. Fact Sheet for Patients: HairSlick.no Fact Sheet for Healthcare Providers: quierodirigir.com This test is not yet approved or cleared by the [place] FDA and  has been authorized for detection and/or diagnosis of SARS-CoV-2 by FDA under an Emergency Use Authorization (EUA). This EUA will remain  in effect (meaning this  test can be used) for the duration of the COVID-19 declaration under Section 56 4(b)(1) of the Act, 21 U.S.C. section 360bbb-3(b)(1), unless the authorization is terminated or revoked sooner. Performed at [facility], [address], [place], [place] [address]   Culture, blood (Routine x 2)     Status: None (Preliminary result)   Collection Time: 07/06/19  4:00 PM   Specimen: BLOOD  Result Value Ref Range Status   Specimen Description   Final    BLOOD RIGHT WRIST Performed at [facility], [address] [place], [place] [address]    Special Requests   Final    BOTTLES DRAWN AEROBIC AND ANAEROBIC Blood Culture adequate volume Performed at [facility], [address] [place], [place] [address]    Culture   Final    NO GROWTH < 12 HOURS Performed at [facility], [address] [place], [place] [address]    Report Status PENDING  Incomplete  Urine culture     Status: None   Collection Time: 07/06/19  4:30 PM   Specimen: Urine, Random  Result Value Ref Range Status   Specimen Description   Final    URINE, RANDOM Performed at [facility], [address] [place], [place] [address]    Special Requests   Final    NONE Performed at [facility], [address] [place], [place] [address]    Culture   Final    NO GROWTH Performed at [facility], [address], [place], [place] [address]    Report Status 07/08/2019 FINAL  Final  Respiratory Panel by PCR     Status: None   Collection Time: 07/06/19  9:42 PM   Specimen: Nasopharyngeal Swab; Respiratory  Result Value Ref Range Status   Adenovirus NOT DETECTED NOT DETECTED Final   Coronavirus 229E NOT DETECTED NOT DETECTED Final    Comment: (NOTE) The Coronavirus on the Respiratory Panel, DOES NOT test for the novel  Coronavirus (2019 nCoV)    Coronavirus HKU1 NOT DETECTED NOT DETECTED Final   Coronavirus NL63 NOT DETECTED NOT DETECTED Final   Coronavirus  OC43 NOT DETECTED NOT DETECTED Final   Metapneumovirus NOT DETECTED NOT DETECTED Final   Rhinovirus / Enterovirus NOT DETECTED NOT DETECTED Final   Influenza A NOT DETECTED NOT DETECTED  Final   Influenza B NOT DETECTED NOT DETECTED Final   Parainfluenza Virus 1 NOT DETECTED NOT DETECTED Final   Parainfluenza Virus 2 NOT DETECTED NOT DETECTED Final   Parainfluenza Virus 3 NOT DETECTED NOT DETECTED Final   Parainfluenza Virus 4 NOT DETECTED NOT DETECTED Final   Respiratory Syncytial Virus NOT DETECTED NOT DETECTED Final   Bordetella pertussis NOT DETECTED NOT DETECTED Final   Chlamydophila pneumoniae NOT DETECTED NOT DETECTED Final   Mycoplasma pneumoniae NOT DETECTED NOT DETECTED Final    Comment: Performed at [facility], [place] [address] [place], [place] [address]  MRSA PCR Screening     Status: None   Collection Time: 07/07/19  3:32 AM   Specimen: Nasal Mucosa; Nasopharyngeal  Result Value Ref Range Status   MRSA by PCR NEGATIVE NEGATIVE Final    Comment:        The GeneXpert MRSA Assay (FDA approved for NASAL specimens only), is one component of a comprehensive MRSA colonization surveillance program. It is not intended to diagnose MRSA infection nor to guide or monitor treatment for MRSA infections. Performed at [facility], [place] [address], [place], [place] [address]   CSF culture     Status: None (Preliminary result)   Collection Time: 07/07/19  1:22 PM   Specimen: PATH Cytology CSF; Cerebrospinal Fluid  Result Value Ref Range Status   Specimen Description   Final    CSF Performed at [place] [address] [place], [place] [address]    Special Requests   Final    NONE Performed at [facility], [place] [address] [place], [place] [address]    Gram Stain   Final    CYTOSPIN SMEAR WBC PRESENT,BOTH PMN AND MONONUCLEAR NO ORGANISMS SEEN Gram Stain Report Called to,Read Back By and Verified With:  HEAVNER,A. RN @1555  ON 11.12.2020 BY COHEN,K Performed at [facility] [place], [place] [address], [place], [place] [address]    Culture   Final    NO GROWTH < 24 HOURS Performed at [place] [address], [place], [place] [address]    Report Status PENDING  Incomplete    Radiology Reports Dg Chest 1 View  Result Date: 07/07/2019 CLINICAL DATA:  Fever, COVID positive EXAM: CHEST  1 VIEW COMPARISON:  July 06, 2019 FINDINGS: There is consolidation at the right lung base. Possible retrocardiac opacity as well. Mild interstitial prominence. No significant pleural effusion. Heart size is likely within normal limits for portable technique. IMPRESSION: Consolidation at the right lung base suspicious for pneumonia. Possible retrocardiac opacity as well Electronically Signed   By: Macy Mis M.D.   On: 07/07/2019 10:07   Ct Head Wo Contrast  Result Date: 07/06/2019 CLINICAL DATA:  Altered mental status EXAM: CT HEAD WITHOUT CONTRAST TECHNIQUE: Contiguous axial images were obtained from the base of the skull through the vertex without intravenous contrast. COMPARISON:  None. FINDINGS: Brain: No evidence of acute infarction, hemorrhage, hydrocephalus, extra-axial collection or mass lesion/mass effect. Vascular: No hyperdense vessel or unexpected calcification. Skull: Normal. Negative for fracture or focal lesion. Sinuses/Orbits: No acute finding. Other: None. IMPRESSION: No acute intracranial pathology. Electronically Signed   By: Eddie Candle M.D.   On: 07/06/2019 17:28   Ct Chest Wo Contrast  Result Date: 07/06/2019 CLINICAL DATA:  Chest pain. Fever and shortness of breath. Abnormal lung base findings on abdominal CT. EXAM: CT CHEST WITHOUT CONTRAST TECHNIQUE: Multidetector CT imaging of the chest was  performed following the standard protocol without IV contrast. COMPARISON:  Chest radiograph earlier today. Lung bases from abdominal CT earlier today. FINDINGS:  Cardiovascular: Thoracic aorta is normal in caliber. No pericardial effusion. Upper normal heart size. Mediastinum/Nodes: Small mediastinal lymph nodes not enlarged by size criteria. Limited assessment for hilar adenopathy given lack of IV contrast. The esophagus is decompressed. No visualized thyroid nodule. Lungs/Pleura: Mild smooth septal thickening. Scattered ill-defined small pulmonary nodules and ground-glass opacities in both lungs, slight dependent and lower lobe predominant. No cavitary component Fissural thickening/fluid in the fissure with trace bilateral pleural effusions. Trachea and mainstem bronchi are patent. Mild bronchial thickening in the lower lobes. Upper Abdomen: Hepatic steatosis. Assessed in full on dedicated abdominal CT earlier this day. Musculoskeletal: There are no acute or suspicious osseous abnormalities. IMPRESSION: 1. Mild septal thickening suspicious for pulmonary edema. Trace bilateral pleural effusions. 2. Scattered ill-defined tiny pulmonary nodules in conjunction with ground-glass opacities in both lungs. Favor atypical infectious or other inflammatory etiology, (not classic for COVID-19 pneumonia). Ground-glass opacity component may represent either edema or atypical infection. Consider follow-up CT after course of treatment to ensure resolution. . Electronically Signed   By: Narda RutherfordMelanie  Sanford M.D.   On: 07/06/2019 19:33   Mr Lumbar Spine W Wo Contrast  Result Date: 07/07/2019 CLINICAL DATA:  Back pain, fever. EXAM: MRI LUMBAR SPINE WITHOUT AND WITH CONTRAST TECHNIQUE: Multiplanar and multiecho pulse sequences of the lumbar spine were obtained without and with intravenous contrast. CONTRAST:  10mL GADAVIST GADOBUTROL 1 MMOL/ML IV SOLN COMPARISON:  X-ray 05/25/2019 FINDINGS: Segmentation:  Standard. Alignment:  Physiologic. Vertebrae: No fracture, evidence of discitis, or bone lesion. No abnormal enhancement on postcontrast sequence. Conus medullaris and cauda equina: Conus  extends to the L1-L2 level. Conus and cauda equina appear normal. Paraspinal and other soft tissues: Negative. Disc levels: T12-L1: Unremarkable. L1-L2: Unremarkable. L2-L3: Unremarkable. L3-L4: Unremarkable. L4-L5: Unremarkable. L5-S1: Minimal degenerative endplate spurring with mild bilateral facet arthrosis resultant mild bilateral foraminal stenosis. No canal stenosis. IMPRESSION: 1. No MR evidence for discitis-osteomyelitis. 2. Mild facet arthropathy with minimal degenerative endplate spurring at L5-S1 with mild bilateral foraminal stenosis. Electronically Signed   By: Duanne GuessNicholas  Plundo M.D.   On: 07/07/2019 12:18   Ct Abdomen Pelvis W Contrast  Result Date: 07/06/2019 CLINICAL DATA:  Fever, headache, nausea, vomiting, abdominal pain EXAM: CT ABDOMEN AND PELVIS WITH CONTRAST TECHNIQUE: Multidetector CT imaging of the abdomen and pelvis was performed using the standard protocol following bolus administration of intravenous contrast. CONTRAST:  100mL OMNIPAQUE IOHEXOL 300 MG/ML  SOLN COMPARISON:  None. FINDINGS: Lower chest: No acute abnormality. Scattered small pulmonary nodules in the dependent bilateral lung bases with some evidence of septal thickening. Trace bilateral pleural effusions. Hepatobiliary: No solid liver abnormality is seen. Hepatic steatosis. No gallstones, gallbladder wall thickening, or biliary dilatation. Pancreas: Unremarkable. No pancreatic ductal dilatation or surrounding inflammatory changes. Spleen: Normal in size without significant abnormality. Adrenals/Urinary Tract: Adrenal glands are unremarkable. Kidneys are normal, without renal calculi, solid lesion, or hydronephrosis. Bladder is unremarkable. Stomach/Bowel: Stomach is within normal limits. Appendix appears normal. No evidence of bowel wall thickening, distention, or inflammatory changes. Vascular/Lymphatic: No significant vascular findings are present. No enlarged abdominal or pelvic lymph nodes. Reproductive: No mass or  other significant abnormality. Other: No abdominal wall hernia or abnormality. Trace ascites in the low abdomen (series 2, image 82). Musculoskeletal: No acute or significant osseous findings. IMPRESSION: 1. No definite CT findings of the abdomen or pelvis to explain symptoms or pain. 2.  Trace ascites in the low abdomen, of uncertain etiology without obvious inflammatory findings. 3.  Hepatic steatosis. 4. Scattered small pulmonary nodules in the dependent bilateral lung bases with some evidence of septal thickening. Trace bilateral pleural effusions. Findings favor atypical infection and a possible component of pulmonary edema. Electronically Signed   By: Lauralyn Primes M.D.   On: 07/06/2019 17:27   Dg Chest Port 1 View  Result Date: 07/06/2019 CLINICAL DATA:  Fever with headache, nausea and vomiting 4 days. EXAM: PORTABLE CHEST 1 VIEW COMPARISON:  None. FINDINGS: Lungs are adequately inflated and otherwise clear. Cardiomediastinal silhouette and remainder of the exam is within normal. IMPRESSION: No active disease. Electronically Signed   By: Elberta Fortis M.D.   On: 07/06/2019 15:52   Dg Fl Guided Lumbar Puncture  Result Date: 07/07/2019 CLINICAL DATA:  Fever. EXAM: DIAGNOSTIC LUMBAR PUNCTURE UNDER FLUOROSCOPIC GUIDANCE FLUOROSCOPY TIME:  Fluoroscopy Time:  0.3 minutes Radiation Exposure Index (if provided by the fluoroscopic device): 24.80 mGy Number of Acquired Spot Images: 1 PROCEDURE: Informed consent was obtained from the patient prior to the procedure, including potential complications of headache, allergy, and pain. With the patient prone, the lower back was prepped with Betadine. 1% Lidocaine was used for local anesthesia. Lumbar puncture was performed at the L3-L4 level using a 20 gauge needle with return of clear CSF with an opening pressure of 9 cm water. 12 ml of CSF were obtained for laboratory studies. The patient tolerated the procedure well and there were no apparent complications.  IMPRESSION: Successful L3-L4 lumbar puncture without immediate postprocedure complication. 12 mL CSF obtained for laboratory studies. Electronically Signed   By: Jackey Loge DO   On: 07/07/2019 13:07   US Abdomen Limited Ruq  Result Date: 07/07/2019 CLINICAL DATA:  Abnormal liver function tests. EXAM: ULTRASOUND ABDOMEN LIMITED RIGHT UPPER QUADRANT COMPARISON:  July 06, 2019. FINDINGS: Gallbladder: No gallstones or wall thickening visualized. No sonographic Murphy sign noted by sonographer. Common bile duct: Diameter: 4 mm which is within normal limits. Liver: Increased echogenicity of hepatic parenchyma is noted. 6 cm low density is noted is noted within the left hepatic lobe of uncertain etiology. No corresponding abnormality is seen on the CT scan of the previous day. Portal vein is patent on color Doppler imaging with normal direction of blood flow towards the liver. Other: Small amount of perihepatic fluid is noted. IMPRESSION: Increased echogenicity of hepatic parenchyma is noted consistent with hepatic steatosis. 6 cm anechoic structure is noted within the left hepatic lobe that is not visualized on CT scan performed the previous day; etiology is unknown, but abscess cannot be excluded although unlikely. Further evaluation with repeat CT scan, or MRI if the patient can hold still and follow breathing instructions, is recommended. Electronically Signed   By: Lupita Raider M.D.   On: 07/07/2019 07:39     Huey Bienenstock M.D on 07/08/2019 at 11:19 AM  Between 7am to 7pm - Pager - [phone]  After 7pm go to [email or web address] - password [facility]  [facility] -  Office  [phone]

## 2019-07-08 NOTE — Progress Notes (Signed)
Subjective:  Feeling better wants to be able to walk around   Antibiotics:  Anti-infectives (From admission, onward)   Start     Dose/Rate Route Frequency Ordered Stop   07/07/19 1600  vancomycin (VANCOCIN) 1,750 mg in sodium chloride 0.9 % 500 mL IVPB  Status:  Discontinued     1,750 mg 250 mL/hr over 120 Minutes Intravenous Every 12 hours 07/07/19 0605 07/07/19 1120   07/07/19 1600  cefTRIAXone (ROCEPHIN) 2 g in sodium chloride 0.9 % 100 mL IVPB     2 g 200 mL/hr over 30 Minutes Intravenous Every 12 hours 07/07/19 1108     07/07/19 1200  vancomycin (VANCOCIN) IVPB 1000 mg/200 mL premix     1,000 mg 200 mL/hr over 60 Minutes Intravenous Every 8 hours 07/07/19 1120     07/07/19 0445  doxycycline (VIBRAMYCIN) 100 mg in sodium chloride 0.9 % 250 mL IVPB     100 mg 125 mL/hr over 120 Minutes Intravenous Every 12 hours 07/07/19 0440     07/07/19 0400  vancomycin (VANCOCIN) 1,750 mg in sodium chloride 0.9 % 500 mL IVPB  Status:  Discontinued     1,750 mg 250 mL/hr over 120 Minutes Intravenous Every 8 hours 07/06/19 1605 07/06/19 2122   07/07/19 0200  vancomycin (VANCOCIN) 1,500 mg in sodium chloride 0.9 % 500 mL IVPB  Status:  Discontinued     1,500 mg 250 mL/hr over 120 Minutes Intravenous Every 8 hours 07/06/19 2122 07/07/19 0605   07/07/19 0000  ceFEPIme (MAXIPIME) 2 g in sodium chloride 0.9 % 100 mL IVPB  Status:  Discontinued     2 g 200 mL/hr over 30 Minutes Intravenous Every 8 hours 07/06/19 1606 07/07/19 1108   07/06/19 2230  acyclovir (ZOVIRAX) 750 mg in dextrose 5 % 150 mL IVPB  Status:  Discontinued     750 mg 165 mL/hr over 60 Minutes Intravenous Every 8 hours 07/06/19 2217 07/07/19 1108   07/06/19 1700  vancomycin (VANCOCIN) IVPB 1000 mg/200 mL premix     1,000 mg 200 mL/hr over 60 Minutes Intravenous Every 1 hr x 2 07/06/19 1603 07/06/19 1828   07/06/19 1600  ceFEPIme (MAXIPIME) 2 g in sodium chloride 0.9 % 100 mL IVPB     2 g 200 mL/hr over 30 Minutes  Intravenous  Once 07/06/19 1547 07/06/19 1644   07/06/19 1600  metroNIDAZOLE (FLAGYL) IVPB 500 mg     500 mg 100 mL/hr over 60 Minutes Intravenous  Once 07/06/19 1547 07/06/19 1936   07/06/19 1600  vancomycin (VANCOCIN) IVPB 1000 mg/200 mL premix  Status:  Discontinued     1,000 mg 200 mL/hr over 60 Minutes Intravenous  Once 07/06/19 1547 07/06/19 1603      Medications: Scheduled Meds:  chlorhexidine  15 mL Mouth Rinse BID   Chlorhexidine Gluconate Cloth  6 each Topical Daily   mouth rinse  15 mL Mouth Rinse q12n4p   phosphorus  250 mg Oral BID   potassium chloride  40 mEq Oral Q6H   Continuous Infusions:  sodium chloride Stopped (07/07/19 0438)   sodium chloride Stopped (07/08/19 0009)   cefTRIAXone (ROCEPHIN)  IV Stopped (07/08/19 0402)   doxycycline (VIBRAMYCIN) IV Stopped (07/08/19 [id number])   lactated ringers 100 mL/hr at 07/08/19 0837   vancomycin Stopped (07/08/19 0419)   PRN Meds:.sodium chloride, sodium chloride, acetaminophen **OR** acetaminophen, hydrALAZINE, ibuprofen, ondansetron (ZOFRAN) IV    Objective: Weight change: 9.005 kg  Intake/Output Summary (Last 24  hours) at 07/08/2019 0923 Last data filed at 07/08/2019 0800 Gross per 24 hour  Intake 5165.13 ml  Output 2651 ml  Net 2514.13 ml   Blood pressure (!) 153/90, pulse (!) 111, temperature (!) 100.4 F (38 C), temperature source Oral, resp. rate 13, height  (1.803 m), weight 115.6 kg, SpO2 90 %. Temp:  [98.4 F (36.9 C)-103.2 F (39.6 C)] 100.4 F (38 C) (11/13 0832) Pulse Rate:  [72-135] 111 (11/13 0800) Resp:  [13-54] 13 (11/13 0800) BP: (112-155)/(35-97) 153/90 (11/13 0800) SpO2:  [90 %-99 %] 90 % (11/13 0800) Weight:  [115.6 kg] 115.6 kg (11/13 0421)  Physical Exam: General: Alert and awake, oriented x3, not in any acute distress. HEENT: anicteric sclera, EOMI CVS regular rate, normal  Chest: , no wheezing, no respiratory distress Abdomen: soft non-distended,  Extremities:  edema in arms and legs Skin: rash see picture Neuro: nonfocal  CBC:  07/08/2019:       BMET Recent Labs    07/07/19 0546 07/08/19 0258  NA 134* 136  K 3.1* 3.2*  CL 100 104  CO2 22 22  GLUCOSE 126* 100*  BUN 15 15  CREATININE 0.89 0.74  CALCIUM 7.8* 8.0*     Liver Panel  Recent Labs    07/06/19 1524 07/07/19 0546 07/08/19 0258  PROT 7.8 6.8  --   ALBUMIN 3.5 3.1* 2.4*  AST 45* 27  --   ALT 54* 46*  --   ALKPHOS 75 88  --   BILITOT 0.9 1.3*  --        Sedimentation Rate Recent Labs    07/07/19 0546  ESRSEDRATE 77*   C-Reactive Protein Recent Labs    07/06/19 2132  CRP 27.3*    Micro Results: Recent Results (from the past 720 hour(s))  Culture, blood (Routine x 2)     Status: None (Preliminary result)   Collection Time: 07/06/19  3:25 PM   Specimen: BLOOD  Result Value Ref Range Status   Specimen Description   Final    BLOOD RIGHT ANTECUBITAL Performed at [facility], [address] [place], [place] [address]    Special Requests   Final    BOTTLES DRAWN AEROBIC AND ANAEROBIC Blood Culture adequate volume Performed at [facility], [address] [place], [place] [address]    Culture   Final    NO GROWTH < 12 HOURS Performed at [facility], [address], [place], [place] [address]    Report Status PENDING  Incomplete  SARS CORONAVIRUS 2 (TAT 6-24 HRS) Nasopharyngeal Nasopharyngeal Swab     Status: None   Collection Time: 07/06/19  3:39 PM   Specimen: Nasopharyngeal Swab  Result Value Ref Range Status   SARS Coronavirus 2 NEGATIVE NEGATIVE Final    Comment: (NOTE) SARS-CoV-2 target nucleic acids are NOT DETECTED. The SARS-CoV-2 RNA is generally detectable in upper and lower respiratory specimens during the acute phase of infection. Negative results do not preclude SARS-CoV-2 infection, do not rule out co-infections with other pathogens, and should not be used as the sole basis for treatment or  other patient management decisions. Negative results must be combined with clinical observations, patient history, and epidemiological information. The expected result is Negative. Fact Sheet for Patients: HairSlick.no Fact Sheet for Healthcare Providers: quierodirigir.com This test is not yet approved or cleared by the [place] FDA and  has been authorized for detection and/or diagnosis of SARS-CoV-2 by FDA under an  Emergency Use Authorization (EUA). This EUA will remain  in effect (meaning this test can be used) for the duration of the COVID-19 declaration under Section 56 4(b)(1) of the Act, 21 U.S.C. section 360bbb-3(b)(1), unless the authorization is terminated or revoked sooner. Performed at [facility], [address], [place], [place] [address]   Culture, blood (Routine x 2)     Status: None (Preliminary result)   Collection Time: 07/06/19  4:00 PM   Specimen: BLOOD  Result Value Ref Range Status   Specimen Description   Final    BLOOD RIGHT WRIST Performed at [facility], [address] [place], [place] [address]    Special Requests   Final    BOTTLES DRAWN AEROBIC AND ANAEROBIC Blood Culture adequate volume Performed at [facility], [address] [place], [place] [address]    Culture   Final    NO GROWTH < 12 HOURS Performed at [facility], [address], [place], [place] [address]    Report Status PENDING  Incomplete  Urine culture     Status: None   Collection Time: 07/06/19  4:30 PM   Specimen: Urine, Random  Result Value Ref Range Status   Specimen Description   Final    URINE, RANDOM Performed at [facility], [address] [place], [place] [address]    Special Requests   Final    NONE Performed at [facility], [address] [place], [place] [address]    Culture   Final    NO GROWTH Performed at [facility], [address] [place], [place] [address]    Report Status 07/08/2019 FINAL  Final  Respiratory Panel by PCR     Status: None   Collection Time: 07/06/19  9:42 PM   Specimen: Nasopharyngeal Swab; Respiratory  Result Value Ref Range Status   Adenovirus NOT DETECTED NOT DETECTED Final   Coronavirus 229E NOT DETECTED NOT DETECTED Final    Comment: (NOTE) The Coronavirus on the Respiratory Panel, DOES NOT test for the novel  Coronavirus (2019 nCoV)    Coronavirus HKU1 NOT DETECTED NOT DETECTED Final   Coronavirus NL63 NOT DETECTED NOT DETECTED Final   Coronavirus OC43 NOT DETECTED NOT DETECTED Final   Metapneumovirus NOT DETECTED NOT DETECTED Final   Rhinovirus / Enterovirus NOT DETECTED NOT DETECTED Final   Influenza A NOT DETECTED NOT DETECTED Final   Influenza B NOT DETECTED NOT DETECTED Final   Parainfluenza Virus 1 NOT DETECTED NOT DETECTED Final   Parainfluenza Virus 2 NOT DETECTED NOT DETECTED Final   Parainfluenza Virus 3 NOT DETECTED NOT DETECTED Final   Parainfluenza Virus 4 NOT DETECTED NOT DETECTED Final   Respiratory Syncytial Virus NOT DETECTED NOT DETECTED Final   Bordetella pertussis NOT DETECTED NOT DETECTED Final   Chlamydophila pneumoniae NOT DETECTED NOT DETECTED Final   Mycoplasma pneumoniae NOT DETECTED NOT DETECTED Final    Comment: Performed at [facility], [address] [place], [place] [address]  MRSA PCR Screening     Status: None   Collection Time: 07/07/19  3:32 AM   Specimen: Nasal Mucosa; Nasopharyngeal  Result Value Ref Range Status   MRSA by PCR NEGATIVE NEGATIVE Final    Comment:        The GeneXpert MRSA Assay (FDA approved for NASAL specimens only), is one component of a comprehensive MRSA colonization surveillance program. It is not  intended to diagnose MRSA infection nor to guide or monitor treatment for MRSA infections. Performed at [facility], [place] [address] [place], [place] [address]   CSF culture     Status: None  (Preliminary result)   Collection Time: 07/07/19  1:22 PM   Specimen: PATH Cytology CSF; Cerebrospinal Fluid  Result Value Ref Range Status   Specimen Description CSF  Final   Special Requests NONE  Final   Gram Stain   Final    CYTOSPIN SMEAR WBC PRESENT,BOTH PMN AND MONONUCLEAR NO ORGANISMS SEEN Gram Stain Report Called to,Read Back By and Verified With: HEAVNER,A. RN @1555  ON 11.12.2020 BY COHEN,K Performed at [facility], [place] [address] [place], [place] [address]    Culture PENDING  Incomplete   Report Status PENDING  Incomplete    Studies/Results: Dg Chest 1 View  Result Date: 07/07/2019 CLINICAL DATA:  Fever, COVID positive EXAM: CHEST  1 VIEW COMPARISON:  July 06, 2019 FINDINGS: There is consolidation at the right lung base. Possible retrocardiac opacity as well. Mild interstitial prominence. No significant pleural effusion. Heart size is likely within normal limits for portable technique. IMPRESSION: Consolidation at the right lung base suspicious for pneumonia. Possible retrocardiac opacity as well Electronically Signed   By: Macy Mis M.D.   On: 07/07/2019 10:07   Ct Head Wo Contrast  Result Date: 07/06/2019 CLINICAL DATA:  Altered mental status EXAM: CT HEAD WITHOUT CONTRAST TECHNIQUE: Contiguous axial images were obtained from the base of the skull through the vertex without intravenous contrast. COMPARISON:  None. FINDINGS: Brain: No evidence of acute infarction, hemorrhage, hydrocephalus, extra-axial collection or mass lesion/mass effect. Vascular: No hyperdense vessel or unexpected calcification. Skull: Normal. Negative for fracture or focal lesion. Sinuses/Orbits: No acute finding. Other: None. IMPRESSION: No acute intracranial pathology. Electronically Signed   By: Eddie Candle M.D.   On: 07/06/2019 17:28   Ct Chest Wo Contrast  Result Date: 07/06/2019 CLINICAL DATA:  Chest pain. Fever and shortness of breath. Abnormal lung base findings  on abdominal CT. EXAM: CT CHEST WITHOUT CONTRAST TECHNIQUE: Multidetector CT imaging of the chest was performed following the standard protocol without IV contrast. COMPARISON:  Chest radiograph earlier today. Lung bases from abdominal CT earlier today. FINDINGS: Cardiovascular: Thoracic aorta is normal in caliber. No pericardial effusion. Upper normal heart size. Mediastinum/Nodes: Small mediastinal lymph nodes not enlarged by size criteria. Limited assessment for hilar adenopathy given lack of IV contrast. The esophagus is decompressed. No visualized thyroid nodule. Lungs/Pleura: Mild smooth septal thickening. Scattered ill-defined small pulmonary nodules and ground-glass opacities in both lungs, slight dependent and lower lobe predominant. No cavitary component Fissural thickening/fluid in the fissure with trace bilateral pleural effusions. Trachea and mainstem bronchi are patent. Mild bronchial thickening in the lower lobes. Upper Abdomen: Hepatic steatosis. Assessed in full on dedicated abdominal CT earlier this day. Musculoskeletal: There are no acute or suspicious osseous abnormalities. IMPRESSION: 1. Mild septal thickening suspicious for pulmonary edema. Trace bilateral pleural effusions. 2. Scattered ill-defined tiny pulmonary nodules in conjunction with ground-glass opacities in both lungs. Favor atypical infectious or other inflammatory etiology, (not classic for COVID-19 pneumonia). Ground-glass opacity component may represent either edema or atypical infection. Consider follow-up CT after course of treatment to ensure resolution. . Electronically Signed   By: Keith Rake M.D.   On: 07/06/2019 19:33   Mr Lumbar Spine W Wo Contrast  Result Date: 07/07/2019 CLINICAL DATA:  Back pain, fever. EXAM: MRI LUMBAR SPINE WITHOUT AND WITH  CONTRAST TECHNIQUE: Multiplanar and multiecho pulse sequences of the lumbar spine were obtained without and with intravenous contrast. CONTRAST:  10mL GADAVIST  GADOBUTROL 1 MMOL/ML IV SOLN COMPARISON:  X-ray 05/25/2019 FINDINGS: Segmentation:  Standard. Alignment:  Physiologic. Vertebrae: No fracture, evidence of discitis, or bone lesion. No abnormal enhancement on postcontrast sequence. Conus medullaris and cauda equina: Conus extends to the L1-L2 level. Conus and cauda equina appear normal. Paraspinal and other soft tissues: Negative. Disc levels: T12-L1: Unremarkable. L1-L2: Unremarkable. L2-L3: Unremarkable. L3-L4: Unremarkable. L4-L5: Unremarkable. L5-S1: Minimal degenerative endplate spurring with mild bilateral facet arthrosis resultant mild bilateral foraminal stenosis. No canal stenosis. IMPRESSION: 1. No MR evidence for discitis-osteomyelitis. 2. Mild facet arthropathy with minimal degenerative endplate spurring at L5-S1 with mild bilateral foraminal stenosis. Electronically Signed   By: Duanne Guess M.D.   On: 07/07/2019 12:18   Ct Abdomen Pelvis W Contrast  Result Date: 07/06/2019 CLINICAL DATA:  Fever, headache, nausea, vomiting, abdominal pain EXAM: CT ABDOMEN AND PELVIS WITH CONTRAST TECHNIQUE: Multidetector CT imaging of the abdomen and pelvis was performed using the standard protocol following bolus administration of intravenous contrast. CONTRAST:  OMNIPAQUE IOHEXOL 300 MG/ML  SOLN COMPARISON:  None. FINDINGS: Lower chest: No acute abnormality. Scattered small pulmonary nodules in the dependent bilateral lung bases with some evidence of septal thickening. Trace bilateral pleural effusions. Hepatobiliary: No solid liver abnormality is seen. Hepatic steatosis. No gallstones, gallbladder wall thickening, or biliary dilatation. Pancreas: Unremarkable. No pancreatic ductal dilatation or surrounding inflammatory changes. Spleen: Normal in size without significant abnormality. Adrenals/Urinary Tract: Adrenal glands are unremarkable. Kidneys are normal, without renal calculi, solid lesion, or hydronephrosis. Bladder is unremarkable. Stomach/Bowel:  Stomach is within normal limits. Appendix appears normal. No evidence of bowel wall thickening, distention, or inflammatory changes. Vascular/Lymphatic: No significant vascular findings are present. No enlarged abdominal or pelvic lymph nodes. Reproductive: No mass or other significant abnormality. Other: No abdominal wall hernia or abnormality. Trace ascites in the low abdomen (series 2, image 82). Musculoskeletal: No acute or significant osseous findings. IMPRESSION: 1. No definite CT findings of the abdomen or pelvis to explain symptoms or pain. 2. Trace ascites in the low abdomen, of uncertain etiology without obvious inflammatory findings. 3.  Hepatic steatosis. 4. Scattered small pulmonary nodules in the dependent bilateral lung bases with some evidence of septal thickening. Trace bilateral pleural effusions. Findings favor atypical infection and a possible component of pulmonary edema. Electronically Signed   By: Lauralyn Primes M.D.   On: 07/06/2019 17:27   Dg Chest Port 1 View  Result Date: 07/06/2019 CLINICAL DATA:  Fever with headache, nausea and vomiting 4 days. EXAM: PORTABLE CHEST 1 VIEW COMPARISON:  None. FINDINGS: Lungs are adequately inflated and otherwise clear. Cardiomediastinal silhouette and remainder of the exam is within normal. IMPRESSION: No active disease. Electronically Signed   By: Elberta Fortis M.D.   On: 07/06/2019 15:52   Dg Fl Guided Lumbar Puncture  Result Date: 07/07/2019 CLINICAL DATA:  Fever. EXAM: DIAGNOSTIC LUMBAR PUNCTURE UNDER FLUOROSCOPIC GUIDANCE FLUOROSCOPY TIME:  Fluoroscopy Time:  0.3 minutes Radiation Exposure Index (if provided by the fluoroscopic device): 24.80 mGy Number of Acquired Spot Images: 1 PROCEDURE: Informed consent was obtained from the patient prior to the procedure, including potential complications of headache, allergy, and pain. With the patient prone, the lower back was prepped with Betadine. 1% Lidocaine was used for local anesthesia. Lumbar  puncture was performed at the L3-L4 level using a 20 gauge needle with return of clear CSF with an  opening pressure of 9 cm water. 12 ml of CSF were obtained for laboratory studies. The patient tolerated the procedure well and there were no apparent complications. IMPRESSION: Successful L3-L4 lumbar puncture without immediate postprocedure complication. 12 mL CSF obtained for laboratory studies. Electronically Signed   By: Jackey Loge DO   On: 07/07/2019 13:07   US Abdomen Limited Ruq  Result Date: 07/07/2019 CLINICAL DATA:  Abnormal liver function tests. EXAM: ULTRASOUND ABDOMEN LIMITED RIGHT UPPER QUADRANT COMPARISON:  July 06, 2019. FINDINGS: Gallbladder: No gallstones or wall thickening visualized. No sonographic Murphy sign noted by sonographer. Common bile duct: Diameter: 4 mm which is within normal limits. Liver: Increased echogenicity of hepatic parenchyma is noted. 6 cm low density is noted is noted within the left hepatic lobe of uncertain etiology. No corresponding abnormality is seen on the CT scan of the previous day. Portal vein is patent on color Doppler imaging with normal direction of blood flow towards the liver. Other: Small amount of perihepatic fluid is noted. IMPRESSION: Increased echogenicity of hepatic parenchyma is noted consistent with hepatic steatosis. 6 cm anechoic structure is noted within the left hepatic lobe that is not visualized on CT scan performed the previous day; etiology is unknown, but abscess cannot be excluded although unlikely. Further evaluation with repeat CT scan, or MRI if the patient can hold still and follow breathing instructions, is recommended. Electronically Signed   By: Lupita Raider M.D.   On: 07/07/2019 07:39      Assessment/Plan:  INTERVAL HISTORY: he is sp LP with >400 WBC, PMN predominance, CSF GS unrevealing lkely due to abx   Principal Problem:   Sepsis (HCC) Active Problems:   Fever   Headache   Rash    Raymond Owens is a 28  y.o. male with apparent bacterial meningitis (vs possibly RMSF) improving on vancomycin, Ceftriaxone and doxycycline  --followup blood cultures (I am hoping these give [place] the organism) and CSF cultures ---negative RMSF serologies WILL NOT exclude this diagnosis   Dr. Luciana Axe is available for questions this weekend and I will be back on Monday.  Case discussed with Anders Simmonds CCM   LOS: 1 day   Acey Lav 07/08/2019, 9:23 AM

## 2019-07-08 NOTE — Progress Notes (Signed)
NAME:  Raymond Owens, MRN:  [id number], DOB:  1991-04-25, LOS: 1 ADMISSION DATE:  07/06/2019, CONSULTATION DATE:  11/12 REFERRING MD:  Marthenia Rolling, CHIEF COMPLAINT:  sepsis   Brief History   28 year old male admitted 11/11 w/ fever, HA, stiff neck and rash.   Past Medical History  Nonsignificant  Significant Hospital Events   11/11 admitted. Started on broad spec abx 11/12 on-going fever, tachycardia, tachypnea. SIRS response. BP stable. PCCM asked to eval given concern about clinical decline. Increased over night from room air to HFNC. ABG w/ progressive hypoxia and resp alk.  Lumbar puncture was completed and consistent with bacterial meningitis 11/13 remains on 5 L nasal cannula but improving hemodynamically stable Consults:  Ccm IR  Infectious disease   Procedures:    Significant Diagnostic Tests:  ANA 11/13>>> Antistreptolysin O titer 11/12>>> 174 RF 11/12>>> 18.9 11/11 (inflammatory labs): DDimer: 1.75, PCT 3.33, LDH 272, ferritin 494, fibrinogen > 800, CRP 27.3 sed rate 77 abd [place] 11/12: Increased echogenicity of hepatic parenchyma is noted consistent with hepatic steatosis. 6 cm anechoic structure is noted within the left hepatic lobe that is not visualized on CT scan performed the previous day; etiology is unknown, but abscess cannot be excluded lthough unlikely.  11/11 CT head: negative 11/11 CT chest abd and pelvis: 1. Mild septal thickening suspicious for pulmonary edema. Trace bilateral pleural effusions.2. Scattered ill-defined tiny pulmonary nodules in conjunction with ground-glass opacities in both lungs.  1. No definite CT findings of the abdomen or pelvis to explain symptoms or pain. 2. Trace ascites in the low abdomen, of uncertain etiology without obvious inflammatory findings. 3.  Hepatic steatosis. Micro/infection Data:  CSF cryptococcal antigen (11/11)>>> negative CSF cell count (11/11)>>> white blood cells 460, hazy,Neutrophil count 82%, HSV by PCR (P 11/11)>>>  Protein and glucose from CSF (11/11)>>> protein was 61,Glucose was 52 quantiferon gold 11/13>>> [place] spotted fever abs panel 11/12>>> RPR 11/11>>> BCX2 11/11>>> COVID 19 11/11: negative  UC 11/11>>>neg HIV ab 11/11>>>NR RVP 11/11: negative  Urine strep antigen 11/12: Negative Antimicrobials:  Cefepime 11/11>>> 11/12 Ceftriaxone 11/12 Doxy 11/11>>> vanc 11/11>>> Acyclovir 11/11>>> 11/12  Interim history/subjective:  Feels better  Objective   Blood pressure (Abnormal) 153/90, pulse (Abnormal) 111, temperature (Abnormal) 100.4 F (38 C), temperature source Oral, resp. rate 13, height _0  (1.803 m), weight 115.6 kg, SpO2 90 %.        Intake/Output Summary (Last 24 hours) at 07/08/2019 0932 Last data filed at 07/08/2019 0800 Gross per 24 hour  Intake 5165.13 ml  Output 2651 ml  Net 2514.13 ml  Current oxygen requirements: 5 L nasal cannula saturations mid 90s at rest [employer]   07/06/19 1513 07/07/19 0359 07/08/19 0421  Weight: 106.6 kg 111 kg 115.6 kg    Examination: General this 28 year old white male is currently sitting up in chair he is in no acute distress he looks less toxic and less labored today HEENT normocephalic atraumatic no jugular venous distention Pulmonary: Some basilar rales no rhonchi no accessory use Cardiac: Remains tachycardic Abdomen: Soft nontender does have frequent diarrhea Extremities: Warm and dry.  Does have some dependent edema.  Diffuse rash persists Neuro: Neuro intact.  Neck stiffness remains.  All extremities with equal strength GU: Voids  Resolved Hospital Problem list     Assessment & Plan:   SIRS/sepsis the working diagnosis of bacterial meningitis. - Primary concern would be tickborne illness such as [facility] spotted fever or perhaps meningitis/CNS infection -Appreciate infectious disease  Plan Continue maintenance IV fluids  Treat fevers  Day #3 antibiotics this includes vancomycin doxycycline and ceftriaxone   Await current culture data Infectious disease suggesting consideration of MRI, will defer that to their team  Diffuse rash Plan Treat underlying infection  Acute hypoxic respiratory failure in the setting of bilateral right greater than left pulmonary infiltrates Remains on 5 L nasal cannula, saturations at rest mid 90s Plan Wean oxygen Pulse oximetry Incentive spirometry with mobility Holding off on diuretics but will keep even  Hypokalemia  Plan Replace and recheck  Best practice:  Diet: Advance Pain/Anxiety/Delirium protocol (if indicated): NA VAP protocol (if indicated): NA DVT prophylaxis: scd GI prophylaxis: NA Glucose control: NA Mobility: BR Code Status: full code Family Communication: pending  Disposition:  He looks better today hemodynamically stable he is now up in chair, we will sign off but be available as needed  Critical care time: NA   Erick Colace ACNP-BC [place] Pager # [phone] OR # [phone] if no answer

## 2019-07-08 NOTE — Progress Notes (Signed)
Lower extremity venous has been completed.   Preliminary results in CV Proc.   Raymond Owens 07/08/2019 1:40 PM

## 2019-07-08 NOTE — Plan of Care (Signed)
  Problem: Education: Goal: Knowledge of General Education information will improve Description: Including pain rating scale, medication(s)/side effects and non-pharmacologic comfort measures Outcome: Progressing   Problem: Health Behavior/Discharge Planning: Goal: Ability to manage health-related needs will improve Outcome: Progressing   Problem: Clinical Measurements: Goal: Ability to maintain clinical measurements within normal limits will improve Outcome: Progressing Goal: Diagnostic test results will improve Outcome: Progressing Goal: Respiratory complications will improve Outcome: Progressing   Problem: Activity: Goal: Risk for activity intolerance will decrease Outcome: Progressing   Problem: Nutrition: Goal: Adequate nutrition will be maintained Outcome: Progressing   Problem: Pain Managment: Goal: General experience of comfort will improve Outcome: Progressing   Problem: Skin Integrity: Goal: Risk for impaired skin integrity will decrease Outcome: Progressing

## 2019-07-09 DIAGNOSIS — J9601 Acute respiratory failure with hypoxia: Secondary | ICD-10-CM | POA: Diagnosis not present

## 2019-07-09 DIAGNOSIS — R509 Fever, unspecified: Secondary | ICD-10-CM

## 2019-07-09 DIAGNOSIS — E876 Hypokalemia: Secondary | ICD-10-CM | POA: Diagnosis not present

## 2019-07-09 DIAGNOSIS — R197 Diarrhea, unspecified: Secondary | ICD-10-CM

## 2019-07-09 DIAGNOSIS — R6520 Severe sepsis without septic shock: Secondary | ICD-10-CM | POA: Diagnosis not present

## 2019-07-09 DIAGNOSIS — R945 Abnormal results of liver function studies: Secondary | ICD-10-CM

## 2019-07-09 DIAGNOSIS — G009 Bacterial meningitis, unspecified: Secondary | ICD-10-CM | POA: Diagnosis not present

## 2019-07-09 DIAGNOSIS — E871 Hypo-osmolality and hyponatremia: Secondary | ICD-10-CM | POA: Diagnosis not present

## 2019-07-09 DIAGNOSIS — Z20828 Contact with and (suspected) exposure to other viral communicable diseases: Secondary | ICD-10-CM | POA: Diagnosis not present

## 2019-07-09 DIAGNOSIS — G030 Nonpyogenic meningitis: Secondary | ICD-10-CM

## 2019-07-09 DIAGNOSIS — A419 Sepsis, unspecified organism: Secondary | ICD-10-CM | POA: Diagnosis not present

## 2019-07-09 DIAGNOSIS — R0603 Acute respiratory distress: Secondary | ICD-10-CM

## 2019-07-09 LAB — CBC WITH DIFFERENTIAL/PLATELET
Abs Immature Granulocytes: 1.14 10*3/uL — ABNORMAL HIGH (ref 0.00–0.07)
Basophils Absolute: 0.2 10*3/uL — ABNORMAL HIGH (ref 0.0–0.1)
Basophils Relative: 1 %
Eosinophils Absolute: 0 10*3/uL (ref 0.0–0.5)
Eosinophils Relative: 0 %
HCT: 38.4 % — ABNORMAL LOW (ref 39.0–52.0)
Hemoglobin: 12.9 g/dL — ABNORMAL LOW (ref 13.0–17.0)
Immature Granulocytes: 4 %
Lymphocytes Relative: 6 %
Lymphs Abs: 1.7 10*3/uL (ref 0.7–4.0)
MCH: 30.1 pg (ref 26.0–34.0)
MCHC: 33.6 g/dL (ref 30.0–36.0)
MCV: 89.5 fL (ref 80.0–100.0)
Monocytes Absolute: 1.3 10*3/uL — ABNORMAL HIGH (ref 0.1–1.0)
Monocytes Relative: 4 %
Neutro Abs: 26.2 10*3/uL — ABNORMAL HIGH (ref 1.7–7.7)
Neutrophils Relative %: 85 %
Platelets: 288 10*3/uL (ref 150–400)
RBC: 4.29 MIL/uL (ref 4.22–5.81)
RDW: 13.8 % (ref 11.5–15.5)
WBC: 30.6 10*3/uL — ABNORMAL HIGH (ref 4.0–10.5)
nRBC: 0.1 % (ref 0.0–0.2)

## 2019-07-09 LAB — BASIC METABOLIC PANEL
Anion gap: 8 (ref 5–15)
BUN: 16 mg/dL (ref 6–20)
CO2: 23 mmol/L (ref 22–32)
Calcium: 7.9 mg/dL — ABNORMAL LOW (ref 8.9–10.3)
Chloride: 104 mmol/L (ref 98–111)
Creatinine, Ser: 0.89 mg/dL (ref 0.61–1.24)
GFR calc Af Amer: >60 mL/min (ref >60)
GFR calc non Af Amer: >60 mL/min (ref >60)
Glucose, Bld: 124 mg/dL — ABNORMAL HIGH (ref 70–99)
Potassium: 3.1 mmol/L — ABNORMAL LOW (ref 3.5–5.1)
Sodium: 135 mmol/L (ref 135–145)

## 2019-07-09 LAB — RENAL FUNCTION PANEL
Albumin: 2.4 g/dL — ABNORMAL LOW (ref 3.5–5.0)
Anion gap: 9 (ref 5–15)
BUN: 14 mg/dL (ref 6–20)
CO2: 22 mmol/L (ref 22–32)
Calcium: 7.7 mg/dL — ABNORMAL LOW (ref 8.9–10.3)
Chloride: 102 mmol/L (ref 98–111)
Creatinine, Ser: 0.89 mg/dL (ref 0.61–1.24)
GFR calc Af Amer: >60 mL/min (ref >60)
GFR calc non Af Amer: >60 mL/min (ref >60)
Glucose, Bld: 138 mg/dL — ABNORMAL HIGH (ref 70–99)
Phosphorus: 1.4 mg/dL — ABNORMAL LOW (ref 2.5–4.6)
Potassium: 3 mmol/L — ABNORMAL LOW (ref 3.5–5.1)
Sodium: 133 mmol/L — ABNORMAL LOW (ref 135–145)

## 2019-07-09 LAB — MAGNESIUM: Magnesium: 2.1 mg/dL (ref 1.7–2.4)

## 2019-07-09 LAB — QUANTIFERON-TB GOLD PLUS (RQFGPL)
QuantiFERON Mitogen Value: 0.04 IU/mL
QuantiFERON Nil Value: 0.01 IU/mL
QuantiFERON TB1 Ag Value: 0.03 IU/mL
QuantiFERON TB2 Ag Value: 0.02 IU/mL

## 2019-07-09 LAB — ROCKY MTN SPOTTED FVR ABS PNL(IGG+IGM)
RMSF IgG: NEGATIVE
RMSF IgM: 0.57 index (ref 0.00–0.89)

## 2019-07-09 LAB — C DIFFICILE QUICK SCREEN W PCR REFLEX
C Diff antigen: NEGATIVE
C Diff interpretation: NOT DETECTED
C Diff toxin: NEGATIVE

## 2019-07-09 LAB — PROCALCITONIN: Procalcitonin: 3.5 ng/mL

## 2019-07-09 LAB — VANCOMYCIN, PEAK: Vancomycin Pk: 20 ug/mL — ABNORMAL LOW (ref 30–40)

## 2019-07-09 LAB — QUANTIFERON-TB GOLD PLUS: QuantiFERON-TB Gold Plus: UNDETERMINED — AB

## 2019-07-09 LAB — FERRITIN: Ferritin: 1019 ng/mL — ABNORMAL HIGH (ref 24–336)

## 2019-07-09 MED ORDER — KETOROLAC TROMETHAMINE 30 MG/ML IJ SOLN
30.0000 mg | Freq: Once | INTRAMUSCULAR | Status: AC
  Administered 2019-07-09: 30 mg via INTRAVENOUS
  Filled 2019-07-09: qty 1

## 2019-07-09 MED ORDER — METOPROLOL TARTRATE 5 MG/5ML IV SOLN
5.0000 mg | INTRAVENOUS | Status: AC | PRN
  Administered 2019-07-09 (×2): 5 mg via INTRAVENOUS
  Filled 2019-07-09 (×2): qty 5

## 2019-07-09 MED ORDER — PREDNISONE 20 MG PO TABS
60.0000 mg | ORAL_TABLET | Freq: Every day | ORAL | Status: DC
  Administered 2019-07-10 – 2019-07-14 (×5): 60 mg via ORAL
  Filled 2019-07-09 (×5): qty 3

## 2019-07-09 MED ORDER — HEPARIN SODIUM (PORCINE) 5000 UNIT/ML IJ SOLN
5000.0000 [IU] | Freq: Three times a day (TID) | INTRAMUSCULAR | Status: DC
  Administered 2019-07-09 – 2019-07-14 (×16): 5000 [IU] via SUBCUTANEOUS
  Filled 2019-07-09 (×16): qty 1

## 2019-07-09 MED ORDER — K PHOS MONO-SOD PHOS DI & MONO 155-852-130 MG PO TABS
500.0000 mg | ORAL_TABLET | Freq: Four times a day (QID) | ORAL | Status: AC
  Administered 2019-07-09 – 2019-07-10 (×6): 500 mg via ORAL
  Filled 2019-07-09 (×6): qty 2

## 2019-07-09 MED ORDER — POTASSIUM CHLORIDE CRYS ER 20 MEQ PO TBCR
40.0000 meq | EXTENDED_RELEASE_TABLET | ORAL | Status: AC
  Administered 2019-07-09 (×3): 40 meq via ORAL
  Filled 2019-07-09 (×3): qty 2

## 2019-07-09 MED ORDER — IBUPROFEN 200 MG PO TABS
400.0000 mg | ORAL_TABLET | Freq: Four times a day (QID) | ORAL | Status: AC | PRN
  Administered 2019-07-09 – 2019-07-11 (×4): 400 mg via ORAL
  Filled 2019-07-09 (×4): qty 2

## 2019-07-09 MED ORDER — PANTOPRAZOLE SODIUM 40 MG PO TBEC
40.0000 mg | DELAYED_RELEASE_TABLET | Freq: Two times a day (BID) | ORAL | Status: DC
  Administered 2019-07-10 – 2019-07-12 (×5): 40 mg via ORAL
  Filled 2019-07-09 (×5): qty 1

## 2019-07-09 MED ORDER — FUROSEMIDE 10 MG/ML IJ SOLN
60.0000 mg | Freq: Once | INTRAMUSCULAR | Status: AC
  Administered 2019-07-09: 60 mg via INTRAVENOUS
  Filled 2019-07-09: qty 6

## 2019-07-09 MED ORDER — POTASSIUM CHLORIDE CRYS ER 20 MEQ PO TBCR
40.0000 meq | EXTENDED_RELEASE_TABLET | Freq: Four times a day (QID) | ORAL | Status: AC
  Administered 2019-07-09: 04:00:00 40 meq via ORAL
  Filled 2019-07-09 (×2): qty 2

## 2019-07-09 MED ORDER — DEXAMETHASONE SODIUM PHOSPHATE 10 MG/ML IJ SOLN
10.0000 mg | Freq: Once | INTRAMUSCULAR | Status: AC
  Administered 2019-07-09: 10 mg via INTRAVENOUS
  Filled 2019-07-09: qty 1

## 2019-07-09 MED ORDER — PANTOPRAZOLE SODIUM 40 MG PO TBEC: 40.0000 mg | DELAYED_RELEASE_TABLET | Freq: Every day | ORAL | Status: DC

## 2019-07-09 MED ORDER — DEXAMETHASONE SODIUM PHOSPHATE 10 MG/ML IJ SOLN
10.0000 mg | Freq: Four times a day (QID) | INTRAMUSCULAR | Status: DC
  Administered 2019-07-09: 10 mg via INTRAVENOUS
  Filled 2019-07-09: qty 1

## 2019-07-09 MED ORDER — FLORANEX PO PACK
1.0000 g | PACK | Freq: Three times a day (TID) | ORAL | Status: DC
  Administered 2019-07-09 – 2019-07-12 (×8): 1 g via ORAL
  Filled 2019-07-09 (×9): qty 1

## 2019-07-09 MED ORDER — SODIUM CHLORIDE 0.9 % IV BOLUS: 1000.0000 mL | Freq: Once | INTRAVENOUS | Status: DC

## 2019-07-09 NOTE — Progress Notes (Signed)
[facility] for Infectious Disease   Reason for visit: Follow up on meningitis  Interval History: developed a fever to 104.8, felt very poorly during the fever, no positive cultures.  WBC increased.  Remains on broad spectrum antibiotics.  Rash worsening. Has photophobia.     Physical Exam: Constitutional:  Vitals:   07/09/19 1200 07/09/19 1328  BP: 129/62   Pulse: (!) 134   Resp: (!) 21   Temp: (!) 104.8 F (40.4 C) (!) 101.4 F (38.6 C)  SpO2: 91%    patient appears in NAD, appears tired Eyes: anicteric HENT: no thrush Respiratory: increased respiratory effort, diffuse rhonchi, speaking in full sentences, no wheezes Cardiovascular: RRR GI: soft, nt, nd Skin: rash on extremities including up left arm, both legs. Tenderness to palpation of skin lesions  Review of Systems: Constitutional: positive for fevers, chills and anorexia or negative for sweats Gastrointestinal: positive for diarrhea, negative for nausea Neurological: positive for headaches, negative for weakness  Lab Results  Component Value Date   WBC 30.6 (H) 07/09/2019   HGB 12.9 (L) 07/09/2019   HCT 38.4 (L) 07/09/2019   MCV 89.5 07/09/2019   PLT 288 07/09/2019    Lab Results  Component Value Date   CREATININE 0.89 07/09/2019   BUN 14 07/09/2019   NA 133 (L) 07/09/2019   K 3.0 (L) 07/09/2019   CL 102 07/09/2019   CO2 22 07/09/2019    Lab Results  Component Value Date   ALT 46 (H) 07/07/2019   AST 27 07/07/2019   ALKPHOS 88 07/07/2019     Microbiology: Recent Results (from the past 240 hour(s))  Culture, blood (Routine x 2)     Status: None (Preliminary result)   Collection Time: 07/06/19  3:25 PM   Specimen: BLOOD  Result Value Ref Range Status   Specimen Description   Final    BLOOD RIGHT ANTECUBITAL Performed at [facility] [place], [address] [place], [place] [address]    Special Requests   Final    BOTTLES DRAWN AEROBIC AND ANAEROBIC Blood Culture adequate volume  Performed at [facility], [address] [place], [place] [address]    Culture   Final    NO GROWTH 2 DAYS Performed at [facility], [place] [address] [place], [place] [address]    Report Status PENDING  Incomplete  SARS CORONAVIRUS 2 (TAT 6-24 HRS) Nasopharyngeal Nasopharyngeal Swab     Status: None   Collection Time: 07/06/19  3:39 PM   Specimen: Nasopharyngeal Swab  Result Value Ref Range Status   SARS Coronavirus 2 NEGATIVE NEGATIVE Final    Comment: (NOTE) SARS-CoV-2 target nucleic acids are NOT DETECTED. The SARS-CoV-2 RNA is generally detectable in upper and lower respiratory specimens during the acute phase of infection. Negative results do not preclude SARS-CoV-2 infection, do not rule out co-infections with other pathogens, and should not be used as the sole basis for treatment or other patient management decisions. Negative results must be combined with clinical observations, patient history, and epidemiological information. The expected result is Negative. Fact Sheet for Patients: SugarRoll.be Fact Sheet for Healthcare Providers: https://www.woods-mathews.com/ This test is not yet approved or cleared by the [place] FDA and  has been authorized for detection and/or diagnosis of SARS-CoV-2 by FDA under an Emergency Use Authorization (EUA). This EUA will remain  in effect (meaning this test can be used) for the duration of the COVID-19 declaration under Section 56 4(b)(1) of  the Act, 21 U.S.C. section 360bbb-3(b)(1), unless the authorization is terminated or revoked sooner. Performed at [facility], [address] [place], [place] [address]   Culture, blood (Routine x 2)     Status: None (Preliminary result)   Collection Time: 07/06/19  4:00 PM   Specimen: BLOOD  Result Value Ref Range Status   Specimen Description   Final    BLOOD RIGHT WRIST Performed at [facility] [place] [facility], [address] [place], [place] [address]    Special Requests   Final    BOTTLES DRAWN AEROBIC AND ANAEROBIC Blood Culture adequate volume Performed at [facility] [place], [address] [place], [place] [address]    Culture   Final    NO GROWTH 2 DAYS Performed at [facility], [address] [place], [place] [address]    Report Status PENDING  Incomplete  Urine culture     Status: None   Collection Time: 07/06/19  4:30 PM   Specimen: Urine, Random  Result Value Ref Range Status   Specimen Description   Final    URINE, RANDOM Performed at [facility], [address] [place], [place] [address]    Special Requests   Final    NONE Performed at [facility] [place], [address] [place], [place] [address]    Culture   Final    NO GROWTH Performed at [facility], [address] [place], [place] [address]    Report Status 07/08/2019 FINAL  Final  Respiratory Panel by PCR     Status: None   Collection Time: 07/06/19  9:42 PM   Specimen: Nasopharyngeal Swab; Respiratory  Result Value Ref Range Status   Adenovirus NOT DETECTED NOT DETECTED Final   Coronavirus 229E NOT DETECTED NOT DETECTED Final    Comment: (NOTE) The Coronavirus on the Respiratory Panel, DOES NOT test for the novel  Coronavirus (2019 nCoV)    Coronavirus HKU1 NOT DETECTED NOT DETECTED Final   Coronavirus NL63 NOT DETECTED NOT DETECTED Final   Coronavirus OC43 NOT DETECTED NOT DETECTED Final   Metapneumovirus NOT DETECTED NOT DETECTED Final   Rhinovirus / Enterovirus NOT DETECTED NOT DETECTED Final   Influenza A NOT DETECTED NOT DETECTED Final   Influenza B NOT DETECTED NOT DETECTED Final   Parainfluenza Virus 1 NOT DETECTED NOT DETECTED Final   Parainfluenza Virus 2 NOT DETECTED NOT DETECTED Final   Parainfluenza Virus 3 NOT DETECTED NOT DETECTED Final   Parainfluenza Virus 4 NOT DETECTED NOT DETECTED Final   Respiratory Syncytial Virus NOT DETECTED NOT DETECTED Final    Bordetella pertussis NOT DETECTED NOT DETECTED Final   Chlamydophila pneumoniae NOT DETECTED NOT DETECTED Final   Mycoplasma pneumoniae NOT DETECTED NOT DETECTED Final    Comment: Performed at [facility], [address] [place], [place] [address]  MRSA PCR Screening     Status: None   Collection Time: 07/07/19  3:32 AM   Specimen: Nasal Mucosa; Nasopharyngeal  Result Value Ref Range Status   MRSA by PCR NEGATIVE NEGATIVE Final    Comment:        The GeneXpert MRSA Assay (FDA approved for NASAL specimens only), is one component of a comprehensive MRSA colonization surveillance program. It is not intended to diagnose MRSA infection nor to guide or monitor treatment for MRSA infections. Performed at [facility], [address] [place], [place] [address]  CSF culture     Status: None (Preliminary result)   Collection Time: 07/07/19  1:22 PM   Specimen: PATH Cytology CSF; Cerebrospinal Fluid  Result Value Ref Range Status   Specimen Description   Final    CSF Performed at [facility], [address], [place], [place] [address]    Special Requests   Final    NONE Performed at [facility], [address] [place], [place] [address]    Gram Stain   Final    CYTOSPIN SMEAR WBC PRESENT,BOTH PMN AND MONONUCLEAR NO ORGANISMS SEEN Gram Stain Report Called to,Read Back By and Verified With: HEAVNER,A. RN @1555  ON 11.12.2020 BY COHEN,K Performed at [facility], [address], [place], [place]    Culture   Final    NO GROWTH 2 DAYS Performed at [facility], [address], [place], [place]    Report Status PENDING  Incomplete    Impression/Plan:  1. Aseptic meningitis with pulmonary lesions - unclear picture.  Certainly infection possible and on broad spectrum antibiotics but with a high fever, multiple organ involvement and no positive cultures, I question if this  could be sarcoidosis or something similar.  No hilar lymphadenpathy but neurosarcoid can present with aseptic meningitis, pulmonary symptoms and rash.  A biopsy of the rash may be easiest, though will take time to return.   I would continue with antibiotics, monitor cultures, continue supportive care and biopsy rash if possible for histopatholgy, ? noncaseating granulomas.    2,  Fever  - up to nearly 105.  Certainly infection can be a cause or other, as in #1.    3.  Respiratory distress - worsening opacities and his RR in up.  Concern for worsening pulmonary disease.  Now receiving steroids after discussion with Dr. [phone] and will monitor.

## 2019-07-09 NOTE — Progress Notes (Signed)
Patient with rectal temp of 104.8 Dr. Patria Mane notified new orders received

## 2019-07-09 NOTE — Progress Notes (Signed)
PROGRESS NOTE                                                                                                                                                                                                             Patient Demographics:    Raymond Owens, is a 27 y.o. male, DOB - 1991-01-30, ZOX:[id number]  Admit date - 07/06/2019   Admitting Physician Therisa Doyne, MD  Outpatient Primary MD for the patient is Nolene Ebbs  LOS - 2   Chief Complaint  Patient presents with   Fever       Brief Narrative    28 years old male without significant past medical history, presents with sepsis, work-up significant for bacterial  meningitis, versus possibly RMSF, appears to be improving on vancomycin, Rocephin and doxycycline.  Remains in ICU, with significant oxygen requirement, with elevated D-dimers, CTA with worsening atypical pneumonia, no evidence of PE, patient remains febrile, frequently tachycardic, tachypneic.   Subjective:    Raymond Owens today remains with high-grade temperature, most recent 104.8 this afternoon, still with diarrhea  still having low-grade fevers, reports generalized weakness and fatigue, complains of headache and photophobia .   Assessment  & Plan :    Principal Problem:   Bacterial meningitis Active Problems:   Sepsis (HCC)   Fever   Headache   Rash   Acute respiratory failure with hypoxemia (HCC)  Severe sepsis -Patient remains significantly tachypneic, tachycardic, with worsening leukocytosis, -Source of sepsis could not be specifically identified, but bacterial meningitis versus RMSF infection highly on the differentials . -Continue with IV vancomycin and Rocephin for presumed bacterial meningitis, so far cultures with no growth , lumbar puncture with >400 WBC, PMN predominance, he does complain of photophobia, headache, typical findings with bacterial meningitis, will start on IV Decadron 10 mg IV  every 6 hours, to continue total of 3 days for bacterial meningitis. -Possible RMSF, continue with IV doxycycline, RMSF IgG/IgM are negative,but negative RMSF serologies WILL NOT exclude this diagnosis per ID. -Worsening leukocytosis, continue to monitor. -Remains quite with severe sepsis, still with significant tachypnea, tachycardia and worsening leukocytosis, will continue monitoring in ICU  Lab Results  Component Value Date   SARSCOV2NAA NEGATIVE 07/06/2019   Hypokalemia/hypophosphatemia -Repleted, continue to monitor  Acute hypoxic respiratory failure -CT findings suspicious for atypical pneumonia, he remains on  5 L nasal cannula this morning, he is on broad-spectrum antibiotic coverage.  Will with evidence of volume overload on repeat CTA chest, will discontinue IV fluids and start on IV diuresis. -Was encouraged to use incentive spirometry. -Decrease his IV fluids  Elevated D-dimers -Most likely due to inflammation, CTA chest, venous Doppler negative for VTE  Transaminitis -due to sepsis, continue to monitor  Rash -Improving, crease clinical suspicion for RMSF  Diarrhea -Improving, will check for C. Difficile.  Code Status : Full  Family Communication  : D/W wife via phone  Disposition Plan  : Home  Barriers For Discharge : Remains septic, on broad-spectrum IV antibiotics  Consults  :  ID, PCCM  Procedures  : LP 11/12  DVT Prophylaxis  :  SCD, [place] heparin  Lab Results  Component Value Date   PLT 288 07/09/2019    Antibiotics  :    Anti-infectives (From admission, onward)   Start     Dose/Rate Route Frequency Ordered Stop   07/07/19 1600  vancomycin (VANCOCIN) 1,750 mg in sodium chloride 0.9 % 500 mL IVPB  Status:  Discontinued     1,750 mg 250 mL/hr over 120 Minutes Intravenous Every 12 hours 07/07/19 0605 07/07/19 1120   07/07/19 1600  cefTRIAXone (ROCEPHIN) 2 g in sodium chloride 0.9 % 100 mL IVPB     2 g 200 mL/hr over 30 Minutes Intravenous Every 12  hours 07/07/19 1108     07/07/19 1200  vancomycin (VANCOCIN) IVPB 1000 mg/200 mL premix     1,000 mg 200 mL/hr over 60 Minutes Intravenous Every 8 hours 07/07/19 1120     07/07/19 0445  doxycycline (VIBRAMYCIN) 100 mg in sodium chloride 0.9 % 250 mL IVPB     100 mg 125 mL/hr over 120 Minutes Intravenous Every 12 hours 07/07/19 0440     07/07/19 0400  vancomycin (VANCOCIN) 1,750 mg in sodium chloride 0.9 % 500 mL IVPB  Status:  Discontinued     1,750 mg 250 mL/hr over 120 Minutes Intravenous Every 8 hours 07/06/19 1605 07/06/19 2122   07/07/19 0200  vancomycin (VANCOCIN) 1,500 mg in sodium chloride 0.9 % 500 mL IVPB  Status:  Discontinued     1,500 mg 250 mL/hr over 120 Minutes Intravenous Every 8 hours 07/06/19 2122 07/07/19 0605   07/07/19 0000  ceFEPIme (MAXIPIME) 2 g in sodium chloride 0.9 % 100 mL IVPB  Status:  Discontinued     2 g 200 mL/hr over 30 Minutes Intravenous Every 8 hours 07/06/19 1606 07/07/19 1108   07/06/19 2230  acyclovir (ZOVIRAX) 750 mg in dextrose 5 % 150 mL IVPB  Status:  Discontinued     750 mg 165 mL/hr over 60 Minutes Intravenous Every 8 hours 07/06/19 2217 07/07/19 1108   07/06/19 1700  vancomycin (VANCOCIN) IVPB 1000 mg/200 mL premix     1,000 mg 200 mL/hr over 60 Minutes Intravenous Every 1 hr x 2 07/06/19 1603 07/06/19 1828   07/06/19 1600  ceFEPIme (MAXIPIME) 2 g in sodium chloride 0.9 % 100 mL IVPB     2 g 200 mL/hr over 30 Minutes Intravenous  Once 07/06/19 1547 07/06/19 1644   07/06/19 1600  metroNIDAZOLE (FLAGYL) IVPB 500 mg     500 mg 100 mL/hr over 60 Minutes Intravenous  Once 07/06/19 1547 07/06/19 1936   07/06/19 1600  vancomycin (VANCOCIN) IVPB 1000 mg/200 mL premix  Status:  Discontinued     1,000 mg 200 mL/hr over 60 Minutes Intravenous  Once  07/06/19 1547 07/06/19 1603        Objective:   Vitals:   07/09/19 0200 07/09/19 0346 07/09/19 0400 07/09/19 0923  BP:   110/65   Pulse:   (!) 116   Resp:   (!) 21   Temp: (!) 101.3 F (38.5 C)   97.7 F (36.5 C) 99.2 F (37.3 C)  TempSrc:   Oral Oral  SpO2:   92%   Weight:  118 kg    Height:        Wt Readings from Last 3 Encounters:  07/09/19 118 kg  05/25/19 112.5 kg  05/20/18 103.4 kg     Intake/Output Summary (Last 24 hours) at 07/09/2019 1201 Last data filed at 07/09/2019 0700 Gross per 24 hour  Intake 2024.42 ml  Output 2550 ml  Net -525.58 ml     Physical Exam  Awake Alert, ill-appearing, mild discomfort  Symmetrical Chest wall movement, tachypneic, diminished air entry at the bases, with rales Tachycardic, no Gallops,Rubs or new Murmurs, No Parasternal Heave +ve B.Sounds, Abd Soft, No tenderness,  No rebound - guarding or rigidity. No Cyanosis, Clubbing, +1 edema upper extremities> lower extremities,rash is subsiding    Data Review:    CBC Recent Labs  Lab 07/06/19 1524 07/07/19 0546 07/08/19 0258 07/09/19 0156  WBC 28.7* 26.7* 26.2* 30.6*  HGB 14.4 13.1 12.1* 12.9*  HCT 42.4 39.0 35.7* 38.4*  PLT 234 240 240 288  MCV 87.1 89.4 88.4 89.5  MCH 29.6 30.0 30.0 30.1  MCHC 34.0 33.6 33.9 33.6  RDW 13.0 13.6 13.7 13.8  LYMPHSABS 1.0  --  1.4 1.7  MONOABS 1.7*  --  1.8* 1.3*  EOSABS 0.0  --  0.0 0.0  BASOSABS 0.1  --  0.1 0.2*    Chemistries  Recent Labs  Lab 07/06/19 1524 07/06/19 1526 07/07/19 0546 07/08/19 0258 07/09/19 0156  NA 132*  --  134* 136 133*  K 2.9*  --  3.1* 3.2* 3.0*  CL 97*  --  100 104 102  CO2 22  --  22 22 22   GLUCOSE 144*  --  126* 100* 138*  BUN 19  --  15 15 14   CREATININE 0.86  --  0.89 0.74 0.89  CALCIUM 8.7*  --  7.8* 8.0* 7.7*  MG  --  1.7  --  1.9  --   AST 45*  --  27  --   --   ALT 54*  --  46*  --   --   ALKPHOS 75  --  88  --   --   BILITOT 0.9  --  1.3*  --   --    ------------------------------------------------------------------------------------------------------------------ Recent Labs    07/06/19 2132  TRIG 80    No results found for:  HGBA1C ------------------------------------------------------------------------------------------------------------------ Recent Labs    07/07/19 0546  TSH 0.682   ------------------------------------------------------------------------------------------------------------------ Recent Labs    07/06/19 2132 07/09/19 0909  FERRITIN 494* 1,019*    Coagulation profile Recent Labs  Lab 07/06/19 1524  INR 1.4*    Recent Labs    07/06/19 2132 07/08/19 1155  DDIMER 1.75* 3.06*    Cardiac Enzymes Recent Labs  Lab 07/07/19 0546  CKMB 2.7   ------------------------------------------------------------------------------------------------------------------ No results found for: BNP  Inpatient Medications  Scheduled Meds:  chlorhexidine  15 mL Mouth Rinse BID   Chlorhexidine Gluconate Cloth  6 each Topical Daily   famotidine  20 mg Oral BID   Gerhardt's butt cream  1 application Topical  TID   mouth rinse  15 mL Mouth Rinse q12n4p   phosphorus  250 mg Oral BID   potassium chloride  40 mEq Oral Q6H   potassium chloride  40 mEq Oral Q4H   sodium chloride flush  10-40 mL Intracatheter Q12H   Continuous Infusions:  sodium chloride Stopped (07/07/19 0438)   sodium chloride 250 mL (07/09/19 1143)   cefTRIAXone (ROCEPHIN)  IV Stopped (07/09/19 0406)   doxycycline (VIBRAMYCIN) IV Stopped (07/09/19 0710)   sodium chloride     vancomycin 1,000 mg (07/09/19 1145)   PRN Meds:.sodium chloride, sodium chloride, acetaminophen **OR** acetaminophen, hydrALAZINE, loperamide, ondansetron (ZOFRAN) IV, simethicone, sodium chloride flush  Micro Results Recent Results (from the past 240 hour(s))  Culture, blood (Routine x 2)     Status: None (Preliminary result)   Collection Time: 07/06/19  3:25 PM   Specimen: BLOOD  Result Value Ref Range Status   Specimen Description   Final    BLOOD RIGHT ANTECUBITAL Performed at [facility], [address] [place], [place] [address]    Special Requests   Final    BOTTLES DRAWN AEROBIC AND ANAEROBIC Blood Culture adequate volume Performed at [facility], [address] [place], [place] [address]    Culture   Final    NO GROWTH 2 DAYS Performed at [facility], [address], [place], [place] [address]    Report Status PENDING  Incomplete  SARS CORONAVIRUS 2 (TAT 6-24 HRS) Nasopharyngeal Nasopharyngeal Swab     Status: None   Collection Time: 07/06/19  3:39 PM   Specimen: Nasopharyngeal Swab  Result Value Ref Range Status   SARS Coronavirus 2 NEGATIVE NEGATIVE Final    Comment: (NOTE) SARS-CoV-2 target nucleic acids are NOT DETECTED. The SARS-CoV-2 RNA is generally detectable in upper and lower respiratory specimens during the acute phase of infection. Negative results do not preclude SARS-CoV-2 infection, do not rule out co-infections with other pathogens, and should not be used as the sole basis for treatment or other patient management decisions. Negative results must be combined with clinical observations, patient history, and epidemiological information. The expected result is Negative. Fact Sheet for Patients: HairSlick.no Fact Sheet for Healthcare Providers: quierodirigir.com This test is not yet approved or cleared by the [place] FDA and  has been authorized for detection and/or diagnosis of SARS-CoV-2 by FDA under an Emergency Use Authorization (EUA). This EUA will remain  in effect (meaning this test can be used) for the duration of the COVID-19 declaration under Section 56 4(b)(1) of the Act, 21 U.S.C. section 360bbb-3(b)(1), unless the authorization is terminated or revoked sooner. Performed at [facility], [address], [place], [place] [address]   Culture, blood (Routine x 2)     Status: None (Preliminary result)   Collection Time: 07/06/19  4:00 PM   Specimen: BLOOD  Result Value Ref  Range Status   Specimen Description   Final    BLOOD RIGHT WRIST Performed at [facility], [address] [place], [place] [address]    Special Requests   Final    BOTTLES DRAWN AEROBIC AND ANAEROBIC Blood Culture adequate volume Performed at [facility], [address] [place], [place] [address]    Culture   Final    NO GROWTH 2 DAYS Performed at [facility], [address], [place], [place] [address]    Report Status PENDING  Incomplete  Urine culture     Status: None   Collection Time: 07/06/19  4:30 PM   Specimen: Urine, Random  Result Value Ref Range Status   Specimen Description   Final    URINE, RANDOM Performed at [facility], [address] [place], [place] [address]    Special Requests   Final    NONE Performed at [facility], [address] [place], [place] [address]    Culture   Final    NO GROWTH Performed at [facility], [address] [place], [place] [address]    Report Status 07/08/2019 FINAL  Final  Respiratory Panel by PCR     Status: None   Collection Time: 07/06/19  9:42 PM   Specimen: Nasopharyngeal Swab; Respiratory  Result Value Ref Range Status   Adenovirus NOT DETECTED NOT DETECTED Final   Coronavirus 229E NOT DETECTED NOT DETECTED Final    Comment: (NOTE) The Coronavirus on the Respiratory Panel, DOES NOT test for the novel  Coronavirus (2019 nCoV)    Coronavirus HKU1 NOT DETECTED NOT DETECTED Final   Coronavirus NL63 NOT DETECTED NOT DETECTED Final   Coronavirus OC43 NOT DETECTED NOT DETECTED Final   Metapneumovirus NOT DETECTED NOT DETECTED Final   Rhinovirus / Enterovirus NOT DETECTED NOT DETECTED Final   Influenza A NOT DETECTED NOT DETECTED Final   Influenza B NOT DETECTED NOT DETECTED Final   Parainfluenza Virus 1 NOT DETECTED NOT DETECTED Final   Parainfluenza Virus 2 NOT DETECTED NOT DETECTED Final   Parainfluenza Virus 3 NOT DETECTED NOT DETECTED Final    Parainfluenza Virus 4 NOT DETECTED NOT DETECTED Final   Respiratory Syncytial Virus NOT DETECTED NOT DETECTED Final   Bordetella pertussis NOT DETECTED NOT DETECTED Final   Chlamydophila pneumoniae NOT DETECTED NOT DETECTED Final   Mycoplasma pneumoniae NOT DETECTED NOT DETECTED Final    Comment: Performed at [facility], [address] [place], [place] [address]  MRSA PCR Screening     Status: None   Collection Time: 07/07/19  3:32 AM   Specimen: Nasal Mucosa; Nasopharyngeal  Result Value Ref Range Status   MRSA by PCR NEGATIVE NEGATIVE Final    Comment:        The GeneXpert MRSA Assay (FDA approved for NASAL specimens only), is one component of a comprehensive MRSA colonization surveillance program. It is not intended to diagnose MRSA infection nor to guide or monitor treatment for MRSA infections. Performed at [facility], [address] [place], [place] [address]   CSF culture     Status: None (Preliminary result)   Collection Time: 07/07/19  1:22 PM   Specimen: PATH Cytology CSF; Cerebrospinal Fluid  Result Value Ref Range Status   Specimen Description   Final    CSF Performed at [facility], [address], [place], [place] [address]    Special Requests   Final    NONE Performed at [facility], [address], [place], [place] [address]    Gram Stain   Final    CYTOSPIN SMEAR WBC PRESENT,BOTH PMN AND MONONUCLEAR NO ORGANISMS SEEN Gram Stain Report Called to,Read Back By and Verified With: HEAVNER,A. RN  ON 11.12.2020 BY COHEN,K Performed at [facility], [address] [place], [place] [address]    Culture   Final    NO GROWTH 2 DAYS Performed at [facility], [address], [place], [place]  [address]    Report Status PENDING  Incomplete    Radiology Reports Dg Chest 1 View  Result Date: 07/07/2019 CLINICAL DATA:  Fever, COVID positive EXAM: CHEST  1 VIEW  COMPARISON:  July 06, 2019 FINDINGS: There is consolidation at the right lung base. Possible retrocardiac opacity as well. Mild interstitial prominence. No significant pleural effusion. Heart size is likely within normal limits for portable technique. IMPRESSION: Consolidation at the right lung base suspicious for pneumonia. Possible retrocardiac opacity as well Electronically Signed   By: Guadlupe Spanish M.D.   On: 07/07/2019 10:07   Ct Head Wo Contrast  Result Date: 07/06/2019 CLINICAL DATA:  Altered mental status EXAM: CT HEAD WITHOUT CONTRAST TECHNIQUE: Contiguous axial images were obtained from the base of the skull through the vertex without intravenous contrast. COMPARISON:  None. FINDINGS: Brain: No evidence of acute infarction, hemorrhage, hydrocephalus, extra-axial collection or mass lesion/mass effect. Vascular: No hyperdense vessel or unexpected calcification. Skull: Normal. Negative for fracture or focal lesion. Sinuses/Orbits: No acute finding. Other: None. IMPRESSION: No acute intracranial pathology. Electronically Signed   By: Lauralyn Primes M.D.   On: 07/06/2019 17:28   Ct Chest Wo Contrast  Result Date: 07/06/2019 CLINICAL DATA:  Chest pain. Fever and shortness of breath. Abnormal lung base findings on abdominal CT. EXAM: CT CHEST WITHOUT CONTRAST TECHNIQUE: Multidetector CT imaging of the chest was performed following the standard protocol without IV contrast. COMPARISON:  Chest radiograph earlier today. Lung bases from abdominal CT earlier today. FINDINGS: Cardiovascular: Thoracic aorta is normal in caliber. No pericardial effusion. Upper normal heart size. Mediastinum/Nodes: Small mediastinal lymph nodes not enlarged by size criteria. Limited assessment for hilar adenopathy given lack of IV contrast. The esophagus is decompressed. No visualized thyroid nodule. Lungs/Pleura: Mild smooth septal thickening. Scattered ill-defined small pulmonary nodules and ground-glass opacities in both  lungs, slight dependent and lower lobe predominant. No cavitary component Fissural thickening/fluid in the fissure with trace bilateral pleural effusions. Trachea and mainstem bronchi are patent. Mild bronchial thickening in the lower lobes. Upper Abdomen: Hepatic steatosis. Assessed in full on dedicated abdominal CT earlier this day. Musculoskeletal: There are no acute or suspicious osseous abnormalities. IMPRESSION: 1. Mild septal thickening suspicious for pulmonary edema. Trace bilateral pleural effusions. 2. Scattered ill-defined tiny pulmonary nodules in conjunction with ground-glass opacities in both lungs. Favor atypical infectious or other inflammatory etiology, (not classic for COVID-19 pneumonia). Ground-glass opacity component may represent either edema or atypical infection. Consider follow-up CT after course of treatment to ensure resolution. . Electronically Signed   By: Narda Rutherford M.D.   On: 07/06/2019 19:33   Ct Angio Chest Pe W Or Wo Contrast  Result Date: 07/08/2019 CLINICAL DATA:  Elevated D-dimer, hypoxia, tachycardia EXAM: CT ANGIOGRAPHY CHEST WITH CONTRAST TECHNIQUE: Multidetector CT imaging of the chest was performed using the standard protocol during bolus administration of intravenous contrast. Multiplanar CT image reconstructions and MIPs were obtained to evaluate the vascular anatomy. CONTRAST:  OMNIPAQUE IOHEXOL 350 MG/ML SOLN COMPARISON:  07/06/2019 FINDINGS: Cardiovascular: Examination is somewhat limited by breath motion artifact, particularly in the bilateral lower lobes. Within this limitation, there is no evidence of pulmonary embolism through the segmental pulmonary arterial level. Mild cardiomegaly. Trace pericardial effusion. Mediastinum/Nodes: No enlarged mediastinal, hilar, or axillary lymph nodes. Thyroid gland, trachea, and esophagus demonstrate no significant findings. Lungs/Pleura: Extensive bilateral ground-glass and consolidative airspace opacity, which  is new compared to prior examination. Diffuse interlobular septal thickening, similar to prior examination. Small bilateral pleural effusions, new  compared to prior exam. Upper Abdomen: No acute abnormality.  Hepatic steatosis. Musculoskeletal: No chest wall abnormality. No acute or significant osseous findings. Review of the MIP images confirms the above findings. IMPRESSION: 1. Examination is somewhat limited by breath motion artifact, particularly in the bilateral lower lobes. Within this limitation, there is no evidence of pulmonary embolism through the segmental pulmonary arterial level. 2. Extensive bilateral ground-glass and consolidative airspace opacity, which is new compared to prior examination. Findings are consistent with multifocal infection. 3. Diffuse interlobular septal thickening, similar to prior examination, and consistent with pulmonary edema. 4. Small bilateral pleural effusions, new compared to prior examination. 5.  Cardiomegaly and trace pericardial effusion. Electronically Signed   By: Lauralyn Primes M.D.   On: 07/08/2019 16:39   Mr Lumbar Spine W Wo Contrast  Result Date: 07/07/2019 CLINICAL DATA:  Back pain, fever. EXAM: MRI LUMBAR SPINE WITHOUT AND WITH CONTRAST TECHNIQUE: Multiplanar and multiecho pulse sequences of the lumbar spine were obtained without and with intravenous contrast. CONTRAST:  10mL GADAVIST GADOBUTROL 1 MMOL/ML IV SOLN COMPARISON:  X-ray 05/25/2019 FINDINGS: Segmentation:  Standard. Alignment:  Physiologic. Vertebrae: No fracture, evidence of discitis, or bone lesion. No abnormal enhancement on postcontrast sequence. Conus medullaris and cauda equina: Conus extends to the L1-L2 level. Conus and cauda equina appear normal. Paraspinal and other soft tissues: Negative. Disc levels: T12-L1: Unremarkable. L1-L2: Unremarkable. L2-L3: Unremarkable. L3-L4: Unremarkable. L4-L5: Unremarkable. L5-S1: Minimal degenerative endplate spurring with mild bilateral facet arthrosis  resultant mild bilateral foraminal stenosis. No canal stenosis. IMPRESSION: 1. No MR evidence for discitis-osteomyelitis. 2. Mild facet arthropathy with minimal degenerative endplate spurring at L5-S1 with mild bilateral foraminal stenosis. Electronically Signed   By: Duanne Guess M.D.   On: 07/07/2019 12:18   Ct Abdomen Pelvis W Contrast  Result Date: 07/06/2019 CLINICAL DATA:  Fever, headache, nausea, vomiting, abdominal pain EXAM: CT ABDOMEN AND PELVIS WITH CONTRAST TECHNIQUE: Multidetector CT imaging of the abdomen and pelvis was performed using the standard protocol following bolus administration of intravenous contrast. CONTRAST:  OMNIPAQUE IOHEXOL 300 MG/ML  SOLN COMPARISON:  None. FINDINGS: Lower chest: No acute abnormality. Scattered small pulmonary nodules in the dependent bilateral lung bases with some evidence of septal thickening. Trace bilateral pleural effusions. Hepatobiliary: No solid liver abnormality is seen. Hepatic steatosis. No gallstones, gallbladder wall thickening, or biliary dilatation. Pancreas: Unremarkable. No pancreatic ductal dilatation or surrounding inflammatory changes. Spleen: Normal in size without significant abnormality. Adrenals/Urinary Tract: Adrenal glands are unremarkable. Kidneys are normal, without renal calculi, solid lesion, or hydronephrosis. Bladder is unremarkable. Stomach/Bowel: Stomach is within normal limits. Appendix appears normal. No evidence of bowel wall thickening, distention, or inflammatory changes. Vascular/Lymphatic: No significant vascular findings are present. No enlarged abdominal or pelvic lymph nodes. Reproductive: No mass or other significant abnormality. Other: No abdominal wall hernia or abnormality. Trace ascites in the low abdomen (series 2, image 82). Musculoskeletal: No acute or significant osseous findings. IMPRESSION: 1. No definite CT findings of the abdomen or pelvis to explain symptoms or pain. 2. Trace ascites in the low  abdomen, of uncertain etiology without obvious inflammatory findings. 3.  Hepatic steatosis. 4. Scattered small pulmonary nodules in the dependent bilateral lung bases with some evidence of septal thickening. Trace bilateral pleural effusions. Findings favor atypical infection and a possible component of pulmonary edema. Electronically Signed   By: Lauralyn Primes M.D.   On: 07/06/2019 17:27   Dg Chest Port 1 View  Result Date: 07/06/2019 CLINICAL DATA:  Fever with headache,  nausea and vomiting 4 days. EXAM: PORTABLE CHEST 1 VIEW COMPARISON:  None. FINDINGS: Lungs are adequately inflated and otherwise clear. Cardiomediastinal silhouette and remainder of the exam is within normal. IMPRESSION: No active disease. Electronically Signed   By: Elberta Fortisaniel  Boyle M.D.   On: 07/06/2019 15:52   Vas [place] Lower Extremity Venous (dvt)  Result Date: 07/08/2019  Lower Venous Study Indications: Edema, and elevated ddimer.  Performing Technologist: Blanch MediaMegan Riddle RVS  Examination Guidelines: A complete evaluation includes B-mode imaging, spectral Doppler, color Doppler, and power Doppler as needed of all accessible portions of each vessel. Bilateral testing is considered an integral part of a complete examination. Limited examinations for reoccurring indications may be performed as noted.  +---------+---------------+---------+-----------+----------+--------------+  RIGHT     Compressibility Phasicity Spontaneity Properties Thrombus Aging  +---------+---------------+---------+-----------+----------+--------------+  CFV       Full            Yes       Yes                                    +---------+---------------+---------+-----------+----------+--------------+  SFJ       Full                                                             +---------+---------------+---------+-----------+----------+--------------+  FV Prox   Full                                                              +---------+---------------+---------+-----------+----------+--------------+  FV Mid    Full                                                             +---------+---------------+---------+-----------+----------+--------------+  FV Distal Full                                                             +---------+---------------+---------+-----------+----------+--------------+  PFV       Full                                                             +---------+---------------+---------+-----------+----------+--------------+  POP       Full            Yes       Yes                                    +---------+---------------+---------+-----------+----------+--------------+  PTV       Full                                                             +---------+---------------+---------+-----------+----------+--------------+  PERO      Full                                                             +---------+---------------+---------+-----------+----------+--------------+   +---------+---------------+---------+-----------+----------+--------------+  LEFT      Compressibility Phasicity Spontaneity Properties Thrombus Aging  +---------+---------------+---------+-----------+----------+--------------+  CFV       Full            Yes       Yes                                    +---------+---------------+---------+-----------+----------+--------------+  SFJ       Full                                                             +---------+---------------+---------+-----------+----------+--------------+  FV Prox   Full                                                             +---------+---------------+---------+-----------+----------+--------------+  FV Mid    Full                                                             +---------+---------------+---------+-----------+----------+--------------+  FV Distal Full                                                              +---------+---------------+---------+-----------+----------+--------------+  PFV       Full                                                             +---------+---------------+---------+-----------+----------+--------------+  POP       Full            Yes       Yes                                    +---------+---------------+---------+-----------+----------+--------------+  PTV       Full                                                             +---------+---------------+---------+-----------+----------+--------------+  PERO      Full                                                             +---------+---------------+---------+-----------+----------+--------------+     Summary: Right: There is no evidence of deep vein thrombosis in the lower extremity. No cystic structure found in the popliteal fossa. Left: There is no evidence of deep vein thrombosis in the lower extremity. No cystic structure found in the popliteal fossa.  *See table(s) above for measurements and observations. Electronically signed by Sherald Hess MD on 07/08/2019 at 3:27:25 PM.    Final    Dg Fl Guided Lumbar Puncture  Result Date: 07/07/2019 CLINICAL DATA:  Fever. EXAM: DIAGNOSTIC LUMBAR PUNCTURE UNDER FLUOROSCOPIC GUIDANCE FLUOROSCOPY TIME:  Fluoroscopy Time:  0.3 minutes Radiation Exposure Index (if provided by the fluoroscopic device): 24.80 mGy Number of Acquired Spot Images: 1 PROCEDURE: Informed consent was obtained from the patient prior to the procedure, including potential complications of headache, allergy, and pain. With the patient prone, the lower back was prepped with Betadine. 1% Lidocaine was used for local anesthesia. Lumbar puncture was performed at the L3-L4 level using a 20 gauge needle with return of clear CSF with an opening pressure of 9 cm water. 12 ml of CSF were obtained for laboratory studies. The patient tolerated the procedure well and there were no apparent complications. IMPRESSION: Successful  L3-L4 lumbar puncture without immediate postprocedure complication. 12 mL CSF obtained for laboratory studies. Electronically Signed   By: Jackey Loge DO   On: 07/07/2019 13:07   US Abdomen Limited Ruq  Result Date: 07/07/2019 CLINICAL DATA:  Abnormal liver function tests. EXAM: ULTRASOUND ABDOMEN LIMITED RIGHT UPPER QUADRANT COMPARISON:  July 06, 2019. FINDINGS: Gallbladder: No gallstones or wall thickening visualized. No sonographic Murphy sign noted by sonographer. Common bile duct: Diameter: 4 mm which is within normal limits. Liver: Increased echogenicity of hepatic parenchyma is noted. 6 cm low density is noted is noted within the left hepatic lobe of uncertain etiology. No corresponding abnormality is seen on the CT scan of the previous day. Portal vein is patent on color Doppler imaging with normal direction of blood flow towards the liver. Other: Small amount of perihepatic fluid is noted. IMPRESSION: Increased echogenicity of hepatic parenchyma is noted consistent with hepatic steatosis. 6 cm anechoic structure is noted within the left hepatic lobe that is not visualized on CT scan performed the previous day; etiology is unknown, but abscess cannot be excluded although unlikely. Further evaluation with repeat CT scan, or MRI if the patient can hold still and follow breathing instructions, is recommended. Electronically Signed   By: Lupita Raider M.D.   On: 07/07/2019 07:39     Huey Bienenstock M.D on 07/09/2019 at 12:01 PM  Between 7am to 7pm - Pager - [phone]  After 7pm go to [email or web address] - password [email or web address]  [facility] -  Office  [phone]

## 2019-07-09 NOTE — Progress Notes (Signed)
Patient had an episode of SVT with heart rate of 181 sustaining in 160's. Patient denies chest pain and pressure. Dr Waldron [facility] notified new orders received.

## 2019-07-10 DIAGNOSIS — R5081 Fever presenting with conditions classified elsewhere: Secondary | ICD-10-CM

## 2019-07-10 LAB — VANCOMYCIN, TROUGH: Vancomycin Tr: 8 ug/mL — ABNORMAL LOW (ref 15–20)

## 2019-07-10 LAB — CBC WITH DIFFERENTIAL/PLATELET
Abs Immature Granulocytes: 2.19 10*3/uL — ABNORMAL HIGH (ref 0.00–0.07)
Basophils Absolute: 0 10*3/uL (ref 0.0–0.1)
Basophils Relative: 0 %
Eosinophils Absolute: 0 10*3/uL (ref 0.0–0.5)
Eosinophils Relative: 0 %
HCT: 36.2 % — ABNORMAL LOW (ref 39.0–52.0)
Hemoglobin: 12.1 g/dL — ABNORMAL LOW (ref 13.0–17.0)
Immature Granulocytes: 5 %
Lymphocytes Relative: 4 %
Lymphs Abs: 1.6 10*3/uL (ref 0.7–4.0)
MCH: 30.3 pg (ref 26.0–34.0)
MCHC: 33.4 g/dL (ref 30.0–36.0)
MCV: 90.5 fL (ref 80.0–100.0)
Monocytes Absolute: 1.9 10*3/uL — ABNORMAL HIGH (ref 0.1–1.0)
Monocytes Relative: 4 %
Neutro Abs: 36.5 10*3/uL — ABNORMAL HIGH (ref 1.7–7.7)
Neutrophils Relative %: 87 %
Platelets: 315 10*3/uL (ref 150–400)
RBC: 4 MIL/uL — ABNORMAL LOW (ref 4.22–5.81)
RDW: 14.4 % (ref 11.5–15.5)
WBC: 42.2 10*3/uL — ABNORMAL HIGH (ref 4.0–10.5)
nRBC: 0 % (ref 0.0–0.2)

## 2019-07-10 LAB — COMPREHENSIVE METABOLIC PANEL
ALT: 135 U/L — ABNORMAL HIGH (ref 0–44)
AST: 122 U/L — ABNORMAL HIGH (ref 15–41)
Albumin: 2.2 g/dL — ABNORMAL LOW (ref 3.5–5.0)
Alkaline Phosphatase: 79 U/L (ref 38–126)
Anion gap: 10 (ref 5–15)
BUN: 17 mg/dL (ref 6–20)
CO2: 22 mmol/L (ref 22–32)
Calcium: 7.6 mg/dL — ABNORMAL LOW (ref 8.9–10.3)
Chloride: 102 mmol/L (ref 98–111)
Creatinine, Ser: 0.7 mg/dL (ref 0.61–1.24)
GFR calc Af Amer: >60 mL/min (ref >60)
GFR calc non Af Amer: >60 mL/min (ref >60)
Glucose, Bld: 136 mg/dL — ABNORMAL HIGH (ref 70–99)
Potassium: 3.3 mmol/L — ABNORMAL LOW (ref 3.5–5.1)
Sodium: 134 mmol/L — ABNORMAL LOW (ref 135–145)
Total Bilirubin: 0.8 mg/dL (ref 0.3–1.2)
Total Protein: 5.9 g/dL — ABNORMAL LOW (ref 6.5–8.1)

## 2019-07-10 LAB — PHOSPHORUS: Phosphorus: 3.3 mg/dL (ref 2.5–4.6)

## 2019-07-10 LAB — C-REACTIVE PROTEIN: CRP: 28.5 mg/dL — ABNORMAL HIGH (ref <1.0)

## 2019-07-10 LAB — VANCOMYCIN, PEAK: Vancomycin Pk: 14 ug/mL — ABNORMAL LOW (ref 30–40)

## 2019-07-10 LAB — PROCALCITONIN: Procalcitonin: 2.46 ng/mL

## 2019-07-10 LAB — FERRITIN: Ferritin: 1910 ng/mL — ABNORMAL HIGH (ref 24–336)

## 2019-07-10 MED ORDER — POTASSIUM CHLORIDE CRYS ER 20 MEQ PO TBCR
40.0000 meq | EXTENDED_RELEASE_TABLET | Freq: Once | ORAL | Status: AC
  Administered 2019-07-10: 40 meq via ORAL
  Filled 2019-07-10: qty 2

## 2019-07-10 MED ORDER — FUROSEMIDE 10 MG/ML IJ SOLN
40.0000 mg | Freq: Once | INTRAMUSCULAR | Status: DC
  Filled 2019-07-10 (×2): qty 4

## 2019-07-10 MED ORDER — VANCOMYCIN HCL 10 G IV SOLR
1500.0000 mg | Freq: Three times a day (TID) | INTRAVENOUS | Status: DC
  Administered 2019-07-10 – 2019-07-12 (×6): 1500 mg via INTRAVENOUS
  Filled 2019-07-10 (×7): qty 1500

## 2019-07-10 NOTE — Progress Notes (Addendum)
PROGRESS NOTE                                                                                                                                                                                                             Patient Demographics:    Raymond Owens, is a 28 y.o. male, DOB - 1990/09/24, OXB:[id number]  Admit date - 07/06/2019   Admitting Physician Therisa Doyne, MD  Outpatient Primary MD for the patient is Nolene Ebbs  LOS - 3   Chief Complaint  Patient presents with   Fever       Brief Narrative    28 years old male without significant past medical history, presents with sepsis, work-up significant for bacterial  meningitis, versus possibly RMSF, appears to be improving on vancomycin, Rocephin and doxycycline.  Remains in ICU, with significant oxygen requirement, with elevated D-dimers, CTA with worsening atypical pneumonia, no evidence of PE, patient remains febrile, frequently tachycardic, tachypneic.   Subjective:    Raymond Owens today reports he is feeling better, dyspnea has improved, did require BiPAP overnight, T-max 103.2 overnight.   Assessment  & Plan :    Principal Problem:   Bacterial meningitis Active Problems:   Sepsis (HCC)   Fever   Headache   Rash   Acute respiratory failure with hypoxemia (HCC)  Severe sepsis -Patient remains significantly tachypneic, tachycardic, with worsening leukocytosis, -Source of sepsis could not be specifically identified, but bacterial meningitis versus RMSF infection highly on the differentials .  -Continue with IV vancomycin and Rocephin for presumed bacterial meningitis, so far cultures with no growth , lumbar puncture with >400 WBC, PMN predominance, he does complain of photophobia, headache, typical findings with  meningitis, this was improved . -Possible RMSF, continue with IV doxycycline, RMSF IgG/IgM are negative,but negative RMSF serologies WILL NOT exclude this  diagnosis per ID. -Remains quite with severe sepsis, still with significant tachypnea, tachycardia and worsening leukocytosis, will continue monitoring in ICU -ID input greatly appreciated, as well autoimmune process such as sarcoidosis is possible skin rash, and lung involvement, and it has been in the literature there is a septic meningitis with sarcoidosis, follow a ACE level in CSF and serum. -Patient was started on steroids, significantly level of inflammatory markers including ferritin, CRP and D-dimers, steroid treatment is indicated and either bacterial meningitis or sarcoidosis. -Discussed  with general surgery to perform skin biopsy for his rash, to be scheduled for tomorrow -Leukocytosis trending up, but this is most likely in the setting of steroids, especially with procalcitonin trending down, and patient appears to be less toxic today.  Lab Results  Component Value Date   SARSCOV2NAA NEGATIVE 07/06/2019   Hypokalemia/hypophosphatemia -Repleted, continue to monitor  Acute hypoxic respiratory failure -CT findings suspicious for atypical pneumonia, he remains on 5 L nasal cannula this morning, he is on broad-spectrum antibiotic coverage.  Will with evidence of volume overload on repeat CTA chest, will discontinue IV fluids and start on IV diuresis. -Was encouraged to use incentive spirometry. -Regions of volume overload, given as needed hydralazine  Elevated D-dimers -Most likely due to inflammation, CTA chest, venous Doppler negative for VTE  Transaminitis -due to sepsis, continue to monitor  Rash -Improving, crease clinical suspicion for RMSF  Diarrhea -Improving, will check for C. Difficile.  Code Status : Full  Family Communication  : Will update his wife today  Disposition Plan  : Home  Barriers For Discharge : Remains septic, on broad-spectrum IV antibiotics  Consults  :  ID, PCCM  Procedures  : LP 11/12  DVT Prophylaxis  :  SCD, [place] heparin  Lab Results    Component Value Date   PLT 315 07/10/2019    Antibiotics  :    Anti-infectives (From admission, onward)   Start     Dose/Rate Route Frequency Ordered Stop   07/07/19 1600  vancomycin (VANCOCIN) 1,750 mg in sodium chloride 0.9 % 500 mL IVPB  Status:  Discontinued     1,750 mg 250 mL/hr over 120 Minutes Intravenous Every 12 hours 07/07/19 0605 07/07/19 1120   07/07/19 1600  cefTRIAXone (ROCEPHIN) 2 g in sodium chloride 0.9 % 100 mL IVPB     2 g 200 mL/hr over 30 Minutes Intravenous Every 12 hours 07/07/19 1108     07/07/19 1200  vancomycin (VANCOCIN) IVPB 1000 mg/200 mL premix     1,000 mg 200 mL/hr over 60 Minutes Intravenous Every 8 hours 07/07/19 1120     07/07/19 0445  doxycycline (VIBRAMYCIN) 100 mg in sodium chloride 0.9 % 250 mL IVPB     100 mg 125 mL/hr over 120 Minutes Intravenous Every 12 hours 07/07/19 0440     07/07/19 0400  vancomycin (VANCOCIN) 1,750 mg in sodium chloride 0.9 % 500 mL IVPB  Status:  Discontinued     1,750 mg 250 mL/hr over 120 Minutes Intravenous Every 8 hours 07/06/19 1605 07/06/19 2122   07/07/19 0200  vancomycin (VANCOCIN) 1,500 mg in sodium chloride 0.9 % 500 mL IVPB  Status:  Discontinued     1,500 mg 250 mL/hr over 120 Minutes Intravenous Every 8 hours 07/06/19 2122 07/07/19 0605   07/07/19 0000  ceFEPIme (MAXIPIME) 2 g in sodium chloride 0.9 % 100 mL IVPB  Status:  Discontinued     2 g 200 mL/hr over 30 Minutes Intravenous Every 8 hours 07/06/19 1606 07/07/19 1108   07/06/19 2230  acyclovir (ZOVIRAX) 750 mg in dextrose 5 % 150 mL IVPB  Status:  Discontinued     750 mg 165 mL/hr over 60 Minutes Intravenous Every 8 hours 07/06/19 2217 07/07/19 1108   07/06/19 1700  vancomycin (VANCOCIN) IVPB 1000 mg/200 mL premix     1,000 mg 200 mL/hr over 60 Minutes Intravenous Every 1 hr x 2 07/06/19 1603 07/06/19 1828   07/06/19 1600  ceFEPIme (MAXIPIME) 2 g in sodium chloride 0.9 %  100 mL IVPB     2 g 200 mL/hr over 30 Minutes Intravenous  Once 07/06/19  1547 07/06/19 1644   07/06/19 1600  metroNIDAZOLE (FLAGYL) IVPB 500 mg     500 mg 100 mL/hr over 60 Minutes Intravenous  Once 07/06/19 1547 07/06/19 1936   07/06/19 1600  vancomycin (VANCOCIN) IVPB 1000 mg/200 mL premix  Status:  Discontinued     1,000 mg 200 mL/hr over 60 Minutes Intravenous  Once 07/06/19 1547 07/06/19 1603        Objective:   Vitals:   07/10/19 0500 07/10/19 0600 07/10/19 0645 07/10/19 0838  BP: 113/60 132/66    Pulse: (!) 106 (!) 112 (!) 104 97  Resp: (!) 56 (!) 36 (!) 42 (!) 32  Temp: (!) 101 F (38.3 C)  99.2 F (37.3 C)   TempSrc:      SpO2: 97% 99% 95% 97%  Weight: 119.3 kg 118.2 kg    Height:        Wt Readings from Last 3 Encounters:  07/10/19 118.2 kg  05/25/19 112.5 kg  05/20/18 103.4 kg     Intake/Output Summary (Last 24 hours) at 07/10/2019 1116 Last data filed at 07/10/2019 0600 Gross per 24 hour  Intake 2694.97 ml  Output 4100 ml  Net -1405.03 ml     Physical Exam  Awake Alert, Oriented X 3, No new F.N deficits, Normal affect, appears to be more comfortable today Symmetrical Chest wall movement, Good air movement bilaterally, no wheezing, no crackles RRR,No Gallops,Rubs or new Murmurs, No Parasternal Heave +ve B.Sounds, Abd Soft, No tenderness, No rebound - guarding or rigidity. No Cyanosis, Clubbing, trace edema, rash is improving     Data Review:    CBC Recent Labs  Lab 07/06/19 1524 07/07/19 0546 07/08/19 0258 07/09/19 0156 07/10/19 0716  WBC 28.7* 26.7* 26.2* 30.6* 42.2*  HGB 14.4 13.1 12.1* 12.9* 12.1*  HCT 42.4 39.0 35.7* 38.4* 36.2*  PLT 234 240 240 288 315  MCV 87.1 89.4 88.4 89.5 90.5  MCH 29.6 30.0 30.0 30.1 30.3  MCHC 34.0 33.6 33.9 33.6 33.4  RDW 13.0 13.6 13.7 13.8 14.4  LYMPHSABS 1.0  --  1.4 1.7 1.6  MONOABS 1.7*  --  1.8* 1.3* 1.9*  EOSABS 0.0  --  0.0 0.0 0.0  BASOSABS 0.1  --  0.1 0.2* 0.0    Chemistries  Recent Labs  Lab 07/06/19 1524 07/06/19 1526 07/07/19 0546 07/08/19 0258  07/09/19 0156 07/09/19 1437 07/10/19 0716  NA 132*  --  134* 136 133* 135 134*  K 2.9*  --  3.1* 3.2* 3.0* 3.1* 3.3*  CL 97*  --  100 104 102 104 102  CO2 22  --  GLUCOSE 144*  --  126* 100* 138* 124* 136*  BUN 19  --  CREATININE 0.86  --  0.89 0.74 0.89 0.89 0.70  CALCIUM 8.7*  --  7.8* 8.0* 7.7* 7.9* 7.6*  MG  --  1.7  --  1.9  --  2.1  --   AST 45*  --  27  --   --   --  122*  ALT 54*  --  46*  --   --   --  135*  ALKPHOS 75  --  88  --   --   --  79  BILITOT 0.9  --  1.3*  --   --   --  0.8   ------------------------------------------------------------------------------------------------------------------ No results for input(s): CHOL, HDL, LDLCALC, TRIG, CHOLHDL, LDLDIRECT in the last 72 hours.  No results found for: HGBA1C ------------------------------------------------------------------------------------------------------------------ No results for input(s): TSH, T4TOTAL, T3FREE, THYROIDAB in the last 72 hours.  Invalid input(s): FREET3 ------------------------------------------------------------------------------------------------------------------ Recent Labs    07/09/19 0909 07/10/19 0716  FERRITIN 1,019* 1,910*    Coagulation profile Recent Labs  Lab 07/06/19 1524  INR 1.4*    Recent Labs    07/08/19 1155  DDIMER 3.06*    Cardiac Enzymes Recent Labs  Lab 07/07/19 0546  CKMB 2.7   ------------------------------------------------------------------------------------------------------------------ No results found for: BNP  Inpatient Medications  Scheduled Meds:  chlorhexidine  15 mL Mouth Rinse BID   Chlorhexidine Gluconate Cloth  6 each Topical Daily   famotidine  20 mg Oral BID   Gerhardt's butt cream  1 application Topical TID   heparin injection (subcutaneous)  5,000 Units Subcutaneous Q8H   lactobacillus  1 g Oral TID WC   mouth rinse  15 mL Mouth Rinse q12n4p   pantoprazole  40 mg Oral BID AC    phosphorus  500 mg Oral QID   predniSONE  60 mg Oral Daily   sodium chloride flush  10-40 mL Intracatheter Q12H   Continuous Infusions:  sodium chloride Stopped (07/07/19 0438)   sodium chloride Stopped (07/10/19 0308)   cefTRIAXone (ROCEPHIN)  IV Stopped (07/10/19 1610)   doxycycline (VIBRAMYCIN) IV Stopped (07/10/19 0654)   sodium chloride     vancomycin Stopped (07/10/19 0446)   PRN Meds:.sodium chloride, sodium chloride, acetaminophen **OR** acetaminophen, hydrALAZINE, ibuprofen, loperamide, ondansetron (ZOFRAN) IV, simethicone, sodium chloride flush  Micro Results Recent Results (from the past 240 hour(s))  Culture, blood (Routine x 2)     Status: None (Preliminary result)   Collection Time: 07/06/19  3:25 PM   Specimen: BLOOD  Result Value Ref Range Status   Specimen Description   Final    BLOOD RIGHT ANTECUBITAL Performed at [facility], [address] [place], [place] [address]    Special Requests   Final    BOTTLES DRAWN AEROBIC AND ANAEROBIC Blood Culture adequate volume Performed at [facility], [address] [place], [place] [address]    Culture   Final    NO GROWTH 3 DAYS Performed at [facility], [address], [place], [place] [address]    Report Status PENDING  Incomplete  SARS CORONAVIRUS 2 (TAT 6-24 HRS) Nasopharyngeal Nasopharyngeal Swab     Status: None   Collection Time: 07/06/19  3:39 PM   Specimen: Nasopharyngeal Swab  Result Value Ref Range Status   SARS Coronavirus 2 NEGATIVE NEGATIVE Final    Comment: (NOTE) SARS-CoV-2 target nucleic acids are NOT DETECTED. The SARS-CoV-2 RNA is generally detectable in upper and lower respiratory specimens during the acute phase of infection. Negative results do not preclude SARS-CoV-2 infection, do not rule out co-infections with other pathogens, and should not be used as the sole basis for treatment or other patient management decisions. Negative results must be  combined with clinical observations, patient history, and epidemiological information. The expected result is Negative. Fact Sheet for Patients: HairSlick.no Fact Sheet for Healthcare Providers: quierodirigir.com This test is not yet approved or cleared by the [place] FDA and  has been authorized for detection and/or diagnosis of SARS-CoV-2 by FDA under an Emergency Use Authorization (EUA). This EUA will remain  in effect (meaning this test can be used) for the duration of  the COVID-19 declaration under Section 56 4(b)(1) of the Act, 21 U.S.C. section 360bbb-3(b)(1), unless the authorization is terminated or revoked sooner. Performed at [facility], [address] [place], [place] [address]   Culture, blood (Routine x 2)     Status: None (Preliminary result)   Collection Time: 07/06/19  4:00 PM   Specimen: BLOOD  Result Value Ref Range Status   Specimen Description   Final    BLOOD RIGHT WRIST Performed at [facility] [place], [address] [place], [place] [address]    Special Requests   Final    BOTTLES DRAWN AEROBIC AND ANAEROBIC Blood Culture adequate volume Performed at [facility] [place] [facility], [address] [place], [place] [address]    Culture   Final    NO GROWTH 3 DAYS Performed at [facility], [address] [place], [place] [address]    Report Status PENDING  Incomplete  Urine culture     Status: None   Collection Time: 07/06/19  4:30 PM   Specimen: Urine, Random  Result Value Ref Range Status   Specimen Description   Final    URINE, RANDOM Performed at [facility] [place], [address] [place], [place] [address]    Special Requests   Final    NONE Performed at [facility] [place], [address] [place], [place] [address]    Culture   Final    NO GROWTH Performed at [facility], [address] [place], [place] [address]    Report Status 07/08/2019 FINAL   Final  Respiratory Panel by PCR     Status: None   Collection Time: 07/06/19  9:42 PM   Specimen: Nasopharyngeal Swab; Respiratory  Result Value Ref Range Status   Adenovirus NOT DETECTED NOT DETECTED Final   Coronavirus 229E NOT DETECTED NOT DETECTED Final    Comment: (NOTE) The Coronavirus on the Respiratory Panel, DOES NOT test for the novel  Coronavirus (2019 nCoV)    Coronavirus HKU1 NOT DETECTED NOT DETECTED Final   Coronavirus NL63 NOT DETECTED NOT DETECTED Final   Coronavirus OC43 NOT DETECTED NOT DETECTED Final   Metapneumovirus NOT DETECTED NOT DETECTED Final   Rhinovirus / Enterovirus NOT DETECTED NOT DETECTED Final   Influenza A NOT DETECTED NOT DETECTED Final   Influenza B NOT DETECTED NOT DETECTED Final   Parainfluenza Virus 1 NOT DETECTED NOT DETECTED Final   Parainfluenza Virus 2 NOT DETECTED NOT DETECTED Final   Parainfluenza Virus 3 NOT DETECTED NOT DETECTED Final   Parainfluenza Virus 4 NOT DETECTED NOT DETECTED Final   Respiratory Syncytial Virus NOT DETECTED NOT DETECTED Final   Bordetella pertussis NOT DETECTED NOT DETECTED Final   Chlamydophila pneumoniae NOT DETECTED NOT DETECTED Final   Mycoplasma pneumoniae NOT DETECTED NOT DETECTED Final    Comment: Performed at [facility], [address] [place], [place] [address]  MRSA PCR Screening     Status: None   Collection Time: 07/07/19  3:32 AM   Specimen: Nasal Mucosa; Nasopharyngeal  Result Value Ref Range Status   MRSA by PCR NEGATIVE NEGATIVE Final    Comment:        The GeneXpert MRSA Assay (FDA approved for NASAL specimens only), is one component of a comprehensive MRSA colonization surveillance program. It is not intended to diagnose MRSA infection nor to guide or monitor treatment for MRSA infections. Performed at [facility], [address], [place], [place] [address]   CSF culture     Status: None (Preliminary result)   Collection Time: 07/07/19  1:22 PM    Specimen: PATH Cytology CSF; Cerebrospinal Fluid  Result Value Ref Range Status   Specimen Description   Final    CSF Performed at [facility], [address] [place], [place] [address]    Special Requests   Final    NONE Performed at [facility], [address] [place], [place] [address]    Gram Stain   Final    CYTOSPIN SMEAR WBC PRESENT,BOTH PMN AND MONONUCLEAR NO ORGANISMS SEEN Gram Stain Report Called to,Read Back By and Verified With: HEAVNER,A. RN  ON 11.12.2020 BY COHEN,K Performed at [facility], [address] [place], [place] [address]    Culture   Final    NO GROWTH 3 DAYS Performed at [facility], [address] [place], [place] [address]    Report Status PENDING  Incomplete  C difficile quick scan w PCR reflex     Status: None   Collection Time: 07/09/19  2:30 PM   Specimen: STOOL  Result Value Ref Range Status   C Diff antigen NEGATIVE NEGATIVE Final   C Diff toxin NEGATIVE NEGATIVE Final   C Diff interpretation No C. difficile detected.  Final    Comment: Performed at [facility], [address] [place], [place] [address]    Radiology Reports Dg Chest 1 View  Result Date: 07/07/2019 CLINICAL DATA:  Fever, COVID positive EXAM: CHEST  1 VIEW COMPARISON:  July 06, 2019 FINDINGS: There is consolidation at the right lung base. Possible retrocardiac opacity as well. Mild interstitial prominence. No significant pleural effusion. Heart size is likely within normal limits for portable technique. IMPRESSION: Consolidation at the right lung base suspicious for pneumonia. Possible retrocardiac opacity as well Electronically Signed   By: Guadlupe Spanish M.D.   On: 07/07/2019 10:07   Ct Head Wo Contrast  Result Date: 07/06/2019 CLINICAL DATA:  Altered mental status EXAM: CT HEAD WITHOUT CONTRAST TECHNIQUE: Contiguous axial images were obtained from the base of the skull  through the vertex without intravenous contrast. COMPARISON:  None. FINDINGS: Brain: No evidence of acute infarction, hemorrhage, hydrocephalus, extra-axial collection or mass lesion/mass effect. Vascular: No hyperdense vessel or unexpected calcification. Skull: Normal. Negative for fracture or focal lesion. Sinuses/Orbits: No acute finding. Other: None. IMPRESSION: No acute intracranial pathology. Electronically Signed   By: Lauralyn Primes M.D.   On: 07/06/2019 17:28   Ct Chest Wo Contrast  Result Date: 07/06/2019 CLINICAL DATA:  Chest pain. Fever and shortness of breath. Abnormal lung base findings on abdominal CT. EXAM: CT CHEST WITHOUT CONTRAST TECHNIQUE: Multidetector CT imaging of the chest was performed following the standard protocol without IV contrast. COMPARISON:  Chest radiograph earlier today. Lung bases from abdominal CT earlier today. FINDINGS: Cardiovascular: Thoracic aorta is normal in caliber. No pericardial effusion. Upper normal heart size. Mediastinum/Nodes: Small mediastinal lymph nodes not enlarged by size criteria. Limited assessment for hilar adenopathy given lack of IV contrast. The esophagus is decompressed. No visualized thyroid nodule. Lungs/Pleura: Mild smooth septal thickening. Scattered ill-defined small pulmonary nodules and ground-glass opacities in both lungs, slight dependent and lower lobe predominant. No cavitary component Fissural thickening/fluid in the fissure with trace bilateral pleural effusions. Trachea and mainstem bronchi are patent. Mild bronchial thickening in the lower lobes. Upper Abdomen: Hepatic steatosis. Assessed in full on dedicated abdominal  CT earlier this day. Musculoskeletal: There are no acute or suspicious osseous abnormalities. IMPRESSION: 1. Mild septal thickening suspicious for pulmonary edema. Trace bilateral pleural effusions. 2. Scattered ill-defined tiny pulmonary nodules in conjunction with ground-glass opacities in both lungs. Favor atypical  infectious or other inflammatory etiology, (not classic for COVID-19 pneumonia). Ground-glass opacity component may represent either edema or atypical infection. Consider follow-up CT after course of treatment to ensure resolution. . Electronically Signed   By: Narda Rutherford M.D.   On: 07/06/2019 19:33   Ct Angio Chest Pe W Or Wo Contrast  Result Date: 07/08/2019 CLINICAL DATA:  Elevated D-dimer, hypoxia, tachycardia EXAM: CT ANGIOGRAPHY CHEST WITH CONTRAST TECHNIQUE: Multidetector CT imaging of the chest was performed using the standard protocol during bolus administration of intravenous contrast. Multiplanar CT image reconstructions and MIPs were obtained to evaluate the vascular anatomy. CONTRAST:  OMNIPAQUE IOHEXOL 350 MG/ML SOLN COMPARISON:  07/06/2019 FINDINGS: Cardiovascular: Examination is somewhat limited by breath motion artifact, particularly in the bilateral lower lobes. Within this limitation, there is no evidence of pulmonary embolism through the segmental pulmonary arterial level. Mild cardiomegaly. Trace pericardial effusion. Mediastinum/Nodes: No enlarged mediastinal, hilar, or axillary lymph nodes. Thyroid gland, trachea, and esophagus demonstrate no significant findings. Lungs/Pleura: Extensive bilateral ground-glass and consolidative airspace opacity, which is new compared to prior examination. Diffuse interlobular septal thickening, similar to prior examination. Small bilateral pleural effusions, new compared to prior exam. Upper Abdomen: No acute abnormality.  Hepatic steatosis. Musculoskeletal: No chest wall abnormality. No acute or significant osseous findings. Review of the MIP images confirms the above findings. IMPRESSION: 1. Examination is somewhat limited by breath motion artifact, particularly in the bilateral lower lobes. Within this limitation, there is no evidence of pulmonary embolism through the segmental pulmonary arterial level. 2. Extensive bilateral ground-glass  and consolidative airspace opacity, which is new compared to prior examination. Findings are consistent with multifocal infection. 3. Diffuse interlobular septal thickening, similar to prior examination, and consistent with pulmonary edema. 4. Small bilateral pleural effusions, new compared to prior examination. 5.  Cardiomegaly and trace pericardial effusion. Electronically Signed   By: Lauralyn Primes M.D.   On: 07/08/2019 16:39   Mr Lumbar Spine W Wo Contrast  Result Date: 07/07/2019 CLINICAL DATA:  Back pain, fever. EXAM: MRI LUMBAR SPINE WITHOUT AND WITH CONTRAST TECHNIQUE: Multiplanar and multiecho pulse sequences of the lumbar spine were obtained without and with intravenous contrast. CONTRAST:  10mL GADAVIST GADOBUTROL 1 MMOL/ML IV SOLN COMPARISON:  X-ray 05/25/2019 FINDINGS: Segmentation:  Standard. Alignment:  Physiologic. Vertebrae: No fracture, evidence of discitis, or bone lesion. No abnormal enhancement on postcontrast sequence. Conus medullaris and cauda equina: Conus extends to the L1-L2 level. Conus and cauda equina appear normal. Paraspinal and other soft tissues: Negative. Disc levels: T12-L1: Unremarkable. L1-L2: Unremarkable. L2-L3: Unremarkable. L3-L4: Unremarkable. L4-L5: Unremarkable. L5-S1: Minimal degenerative endplate spurring with mild bilateral facet arthrosis resultant mild bilateral foraminal stenosis. No canal stenosis. IMPRESSION: 1. No MR evidence for discitis-osteomyelitis. 2. Mild facet arthropathy with minimal degenerative endplate spurring at L5-S1 with mild bilateral foraminal stenosis. Electronically Signed   By: Duanne Guess M.D.   On: 07/07/2019 12:18   Ct Abdomen Pelvis W Contrast  Result Date: 07/06/2019 CLINICAL DATA:  Fever, headache, nausea, vomiting, abdominal pain EXAM: CT ABDOMEN AND PELVIS WITH CONTRAST TECHNIQUE: Multidetector CT imaging of the abdomen and pelvis was performed using the standard protocol following bolus administration of intravenous  contrast. CONTRAST:  OMNIPAQUE IOHEXOL 300 MG/ML  SOLN COMPARISON:  None. FINDINGS: Lower chest: No acute abnormality. Scattered small pulmonary nodules in the dependent bilateral lung bases with some evidence of septal thickening. Trace bilateral pleural effusions. Hepatobiliary: No solid liver abnormality is seen. Hepatic steatosis. No gallstones, gallbladder wall thickening, or biliary dilatation. Pancreas: Unremarkable. No pancreatic ductal dilatation or surrounding inflammatory changes. Spleen: Normal in size without significant abnormality. Adrenals/Urinary Tract: Adrenal glands are unremarkable. Kidneys are normal, without renal calculi, solid lesion, or hydronephrosis. Bladder is unremarkable. Stomach/Bowel: Stomach is within normal limits. Appendix appears normal. No evidence of bowel wall thickening, distention, or inflammatory changes. Vascular/Lymphatic: No significant vascular findings are present. No enlarged abdominal or pelvic lymph nodes. Reproductive: No mass or other significant abnormality. Other: No abdominal wall hernia or abnormality. Trace ascites in the low abdomen (series 2, image 82). Musculoskeletal: No acute or significant osseous findings. IMPRESSION: 1. No definite CT findings of the abdomen or pelvis to explain symptoms or pain. 2. Trace ascites in the low abdomen, of uncertain etiology without obvious inflammatory findings. 3.  Hepatic steatosis. 4. Scattered small pulmonary nodules in the dependent bilateral lung bases with some evidence of septal thickening. Trace bilateral pleural effusions. Findings favor atypical infection and a possible component of pulmonary edema. Electronically Signed   By: Lauralyn Primes M.D.   On: 07/06/2019 17:27   Dg Chest Port 1 View  Result Date: 07/06/2019 CLINICAL DATA:  Fever with headache, nausea and vomiting 4 days. EXAM: PORTABLE CHEST 1 VIEW COMPARISON:  None. FINDINGS: Lungs are adequately inflated and otherwise clear.  Cardiomediastinal silhouette and remainder of the exam is within normal. IMPRESSION: No active disease. Electronically Signed   By: Elberta Fortis M.D.   On: 07/06/2019 15:52   Vas [place] Lower Extremity Venous (dvt)  Result Date: 07/08/2019  Lower Venous Study Indications: Edema, and elevated ddimer.  Performing Technologist: Blanch Media RVS  Examination Guidelines: A complete evaluation includes B-mode imaging, spectral Doppler, color Doppler, and power Doppler as needed of all accessible portions of each vessel. Bilateral testing is considered an integral part of a complete examination. Limited examinations for reoccurring indications may be performed as noted.  +---------+---------------+---------+-----------+----------+--------------+  RIGHT     Compressibility Phasicity Spontaneity Properties Thrombus Aging  +---------+---------------+---------+-----------+----------+--------------+  CFV       Full            Yes       Yes                                    +---------+---------------+---------+-----------+----------+--------------+  SFJ       Full                                                             +---------+---------------+---------+-----------+----------+--------------+  FV Prox   Full                                                             +---------+---------------+---------+-----------+----------+--------------+  FV Mid    Full                                                             +---------+---------------+---------+-----------+----------+--------------+  FV Distal Full                                                             +---------+---------------+---------+-----------+----------+--------------+  PFV       Full                                                             +---------+---------------+---------+-----------+----------+--------------+  POP       Full            Yes       Yes                                     +---------+---------------+---------+-----------+----------+--------------+  PTV       Full                                                             +---------+---------------+---------+-----------+----------+--------------+  PERO      Full                                                             +---------+---------------+---------+-----------+----------+--------------+   +---------+---------------+---------+-----------+----------+--------------+  LEFT      Compressibility Phasicity Spontaneity Properties Thrombus Aging  +---------+---------------+---------+-----------+----------+--------------+  CFV       Full            Yes       Yes                                    +---------+---------------+---------+-----------+----------+--------------+  SFJ       Full                                                             +---------+---------------+---------+-----------+----------+--------------+  FV Prox   Full                                                             +---------+---------------+---------+-----------+----------+--------------+  FV Mid    Full                                                             +---------+---------------+---------+-----------+----------+--------------+  FV Distal Full                                                             +---------+---------------+---------+-----------+----------+--------------+  PFV       Full                                                             +---------+---------------+---------+-----------+----------+--------------+  POP       Full            Yes       Yes                                    +---------+---------------+---------+-----------+----------+--------------+  PTV       Full                                                             +---------+---------------+---------+-----------+----------+--------------+  PERO      Full                                                              +---------+---------------+---------+-----------+----------+--------------+     Summary: Right: There is no evidence of deep vein thrombosis in the lower extremity. No cystic structure found in the popliteal fossa. Left: There is no evidence of deep vein thrombosis in the lower extremity. No cystic structure found in the popliteal fossa.  *See table(s) above for measurements and observations. Electronically signed by Monica Martinez MD on 07/08/2019 at 3:27:25 PM.    Final    Dg Fl Guided Lumbar Puncture  Result Date: 07/07/2019 CLINICAL DATA:  Fever. EXAM: DIAGNOSTIC LUMBAR PUNCTURE UNDER FLUOROSCOPIC GUIDANCE FLUOROSCOPY TIME:  Fluoroscopy Time:  0.3 minutes Radiation Exposure Index (if provided by the fluoroscopic device): 24.80 mGy Number of Acquired Spot Images: 1 PROCEDURE: Informed consent was obtained from the patient prior to the procedure, including potential complications of headache, allergy, and pain. With the patient prone, the lower back was prepped with Betadine. 1% Lidocaine was used for local anesthesia. Lumbar puncture was performed at the L3-L4 level using a 20 gauge needle with return of clear CSF with an opening pressure of 9 cm water. 12 ml of CSF were obtained for laboratory studies. The patient tolerated the procedure well and there were no apparent complications. IMPRESSION: Successful L3-L4 lumbar puncture without immediate postprocedure complication. 12 mL CSF obtained for laboratory studies. Electronically Signed   By: Kellie Simmering DO   On: 07/07/2019 13:07   US Abdomen Limited Ruq  Result Date: 07/07/2019 CLINICAL DATA:  Abnormal liver function tests. EXAM: ULTRASOUND ABDOMEN LIMITED RIGHT UPPER QUADRANT COMPARISON:  July 06, 2019.  FINDINGS: Gallbladder: No gallstones or wall thickening visualized. No sonographic Murphy sign noted by sonographer. Common bile duct: Diameter: 4 mm which is within normal limits. Liver: Increased echogenicity of hepatic parenchyma is noted.  6 cm low density is noted is noted within the left hepatic lobe of uncertain etiology. No corresponding abnormality is seen on the CT scan of the previous day. Portal vein is patent on color Doppler imaging with normal direction of blood flow towards the liver. Other: Small amount of perihepatic fluid is noted. IMPRESSION: Increased echogenicity of hepatic parenchyma is noted consistent with hepatic steatosis. 6 cm anechoic structure is noted within the left hepatic lobe that is not visualized on CT scan performed the previous day; etiology is unknown, but abscess cannot be excluded although unlikely. Further evaluation with repeat CT scan, or MRI if the patient can hold still and follow breathing instructions, is recommended. Electronically Signed   By: Lupita Raider M.D.   On: 07/07/2019 07:39     Huey Bienenstock M.D on 07/10/2019 at 11:16 AM  Between 7am to 7pm - Pager - [phone]  After 7pm go to [email or web address] - password [facility]  [facility] -  Office  [phone]

## 2019-07-10 NOTE — Progress Notes (Signed)
RT found patient off BIPAP during rounds. Patient is resting comfortably and Sats are 92%. RT will continue to monitor

## 2019-07-10 NOTE — Progress Notes (Signed)
Pharmacy Antibiotic Note  Raymond Owens is a 28 y.o. male presented to [facility] on 07/06/2019 with  fever, HA, AMS and n/v.  Lumbar puncture performed in the ED was unsucessful. He was started on abx sepsis, RMSF and suspected meningitis.  Patient was subsequently transferred to [facility] for further medical management.  - acyclovir d/ced on 11/12, cefepime changed to ceftriaxone on 11/12 per recom. from ID - Vancomycin peak obtained at 0716 was 14. However this was about 3 hours after dose completed. Trough obtained this AM is 8 mcg/ml   Plan: - Increase vancomycin dose to 1500 mg IV q8h due to meningitis diagnosis  for goal vancomycin trough level 15-20 - ceftriaxone 2gm IV q12h - doxycycline 100 mg IV q12h  - Obtain VT once new dose is  ______________________________________________  Height: 5\' 11"  (180.3 cm) Weight: 260 lb 9.3 oz (118.2 kg) IBW/kg (Calculated) : 75.3  Temp (24hrs), Avg:100.6 F (38.1 C), Min:97.9 F (36.6 C), Max:104.8 F (40.4 C)  Recent Labs  Lab 07/06/19 1524 07/06/19 1525 07/06/19 1735 07/06/19 2132 07/07/19 0546 07/07/19 1340 07/08/19 0258 07/09/19 0156 07/09/19 1437 07/10/19 0716 07/10/19 1109  WBC 28.7*  --   --   --  26.7*  --  26.2* 30.6*  --  42.2*  --   CREATININE 0.86  --   --   --  0.89  --  0.74 0.89 0.89 0.70  --   LATICACIDVEN  --  3.3* 2.3* 2.2*  --  1.7  --   --   --   --   --   VANCOTROUGH  --   --   --   --   --   --   --   --   --   --  8*  VANCOPEAK  --   --   --   --   --   --   --   --  20* 14*  --     Estimated Creatinine Clearance: 181.5 mL/min (by C-G formula based on SCr of 0.7 mg/dL).    Allergies  Allergen Reactions  . Penicillins Rash    Did it involve swelling of the face/tongue/throat, SOB, or low BP? No Did it involve sudden or severe rash/hives, skin peeling, or any reaction on the inside of your mouth or nose? Yes Did you need to seek medical attention at a hospital or doctor's office? No When did it last  happen?unknown If all above answers are "NO", may proceed with cephalosporin use.      Thank you for allowing pharmacy to be a part of this patient's care.   Royetta Asal, PharmD, BCPS 07/10/2019 11:54 AM

## 2019-07-10 NOTE — Progress Notes (Signed)
Dr. Linus Salmons paged to [facility] phone, per patients wife.

## 2019-07-10 NOTE — Progress Notes (Signed)
We are following at a distance as no ccm needs at present/  Most striking are his Procalcitonins and Temp spikes suggesting infection, not sarcoid.   Pred rx  11/14 = started at 60 mg per day / ACE level pending but would be non specific in this setting    Lab Results  Component Value Date   ESRSEDRATE 77 (H) 07/07/2019     NB: it is extremely unlikely that this is acute sarcoid and lung bx very risky at this point and likely just would show ALI,  Not NCG's  >  ? Needs skin bx ? Vasculitis?   Will see again at any point in consultation but nothing else to offer at this point.    Christinia Gully, MD [facility] [place] [phone] After 5:30 PM or weekends, use [facility] [phone]

## 2019-07-11 ENCOUNTER — Inpatient Hospital Stay (HOSPITAL_COMMUNITY): Payer: 59

## 2019-07-11 DIAGNOSIS — R945 Abnormal results of liver function studies: Secondary | ICD-10-CM

## 2019-07-11 DIAGNOSIS — G030 Nonpyogenic meningitis: Secondary | ICD-10-CM

## 2019-07-11 DIAGNOSIS — D72829 Elevated white blood cell count, unspecified: Secondary | ICD-10-CM

## 2019-07-11 LAB — CBC
HCT: 37.8 % — ABNORMAL LOW (ref 39.0–52.0)
Hemoglobin: 12.6 g/dL — ABNORMAL LOW (ref 13.0–17.0)
MCH: 30.2 pg (ref 26.0–34.0)
MCHC: 33.3 g/dL (ref 30.0–36.0)
MCV: 90.6 fL (ref 80.0–100.0)
Platelets: 305 10*3/uL (ref 150–400)
RBC: 4.17 MIL/uL — ABNORMAL LOW (ref 4.22–5.81)
RDW: 14.5 % (ref 11.5–15.5)
WBC: 34.8 10*3/uL — ABNORMAL HIGH (ref 4.0–10.5)
nRBC: 0.1 % (ref 0.0–0.2)

## 2019-07-11 LAB — PROCALCITONIN: Procalcitonin: 1.95 ng/mL

## 2019-07-11 LAB — COMPREHENSIVE METABOLIC PANEL
ALT: 203 U/L — ABNORMAL HIGH (ref 0–44)
AST: 166 U/L — ABNORMAL HIGH (ref 15–41)
Albumin: 2 g/dL — ABNORMAL LOW (ref 3.5–5.0)
Alkaline Phosphatase: 79 U/L (ref 38–126)
Anion gap: 8 (ref 5–15)
BUN: 23 mg/dL — ABNORMAL HIGH (ref 6–20)
CO2: 20 mmol/L — ABNORMAL LOW (ref 22–32)
Calcium: 6.8 mg/dL — ABNORMAL LOW (ref 8.9–10.3)
Chloride: 110 mmol/L (ref 98–111)
Creatinine, Ser: 0.83 mg/dL (ref 0.61–1.24)
GFR calc Af Amer: >60 mL/min (ref >60)
GFR calc non Af Amer: >60 mL/min (ref >60)
Glucose, Bld: 111 mg/dL — ABNORMAL HIGH (ref 70–99)
Potassium: 4.2 mmol/L (ref 3.5–5.1)
Sodium: 138 mmol/L (ref 135–145)
Total Bilirubin: 0.8 mg/dL (ref 0.3–1.2)
Total Protein: 5.2 g/dL — ABNORMAL LOW (ref 6.5–8.1)

## 2019-07-11 LAB — CULTURE, BLOOD (ROUTINE X 2)
Culture: NO GROWTH
Culture: NO GROWTH
Special Requests: ADEQUATE
Special Requests: ADEQUATE

## 2019-07-11 LAB — VDRL, CSF: VDRL Quant, CSF: NONREACTIVE

## 2019-07-11 LAB — CSF CULTURE W GRAM STAIN: Culture: NO GROWTH

## 2019-07-11 LAB — PHOSPHORUS: Phosphorus: 3.5 mg/dL (ref 2.5–4.6)

## 2019-07-11 LAB — ANGIOTENSIN CONVERTING ENZYME: Angiotensin-Converting Enzyme: 26 U/L (ref 14–82)

## 2019-07-11 LAB — ANGIOTENSIN CONVERTING ENZYME, CSF: Angio Convert Enzyme: <1.5 U/L (ref 0.0–2.5)

## 2019-07-11 LAB — ANTINUCLEAR ANTIBODIES, IFA: ANA Ab, IFA: NEGATIVE

## 2019-07-11 IMAGING — MR MR HEAD WO/W CM
11 of 13 series · 35 of 48 positions shown · IV contrast (gadavist)
Comparison: CT head without contrast [DATE]

CLINICAL DATA: Confusion. Meningitis. Fever. Patient as bilateral
ground-glass nodules. Rash.

EXAM:
MRI HEAD WITHOUT AND WITH CONTRAST
TECHNIQUE: Multiplanar, multiecho pulse sequences of the brain and surrounding
structures were obtained without and with intravenous contrast.
CONTRAST:  10mL GADAVIST GADOBUTROL 1 MMOL/ML IV SOLN

[Series 3: T1 · sagittal · 5.0mm · 0.47mm/px · 1 of 21 slices shown]
[im 1/21]
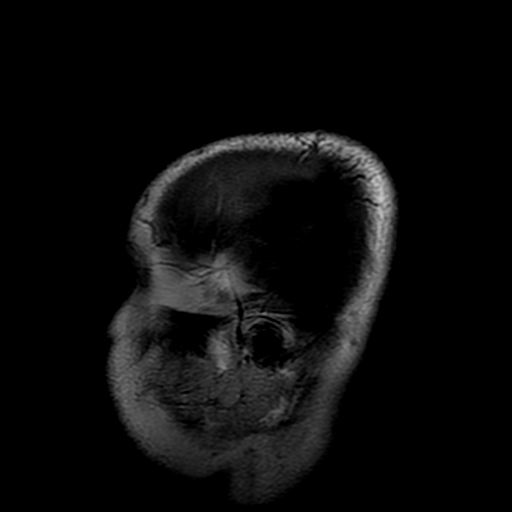

[Series 4: DWI · axial · 3.0mm · 1.09mm/px · z∈[-65,+72]mm · 6 of 94 slices shown (1 of 4)]
[im 1/94]
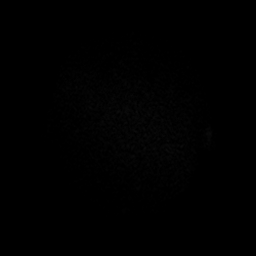
[im 19/94]
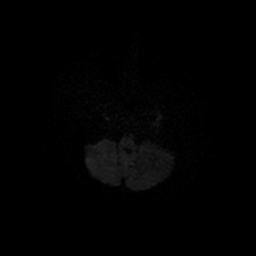
[im 38/94]
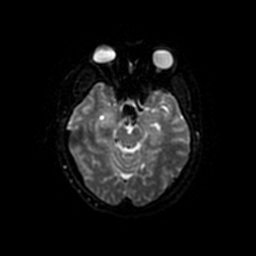
[im 56/94]
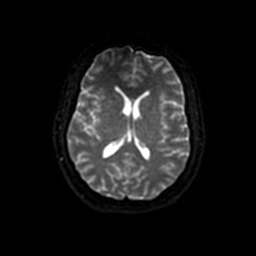
[im 75/94]
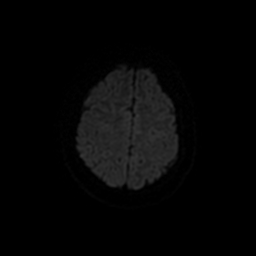
[im 94/94]
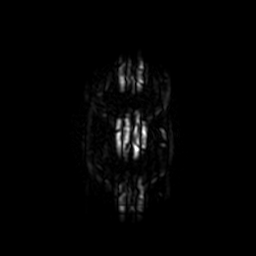

[Series 5: T2 · axial · 5.0mm · 0.90mm/px · 1 of 21 slices shown]
[im 1/21]
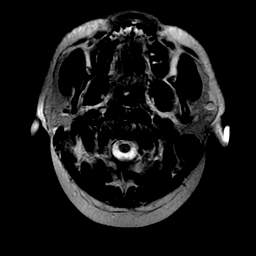

[Series 6: FLAIR · axial · 3.0mm · 0.90mm/px · z∈[-71,+72]mm · 2 of 25 slices shown]
[im 1/25]
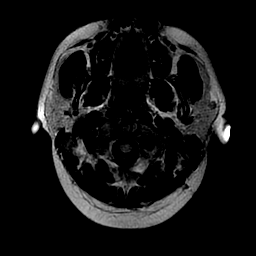
[im 25/25]
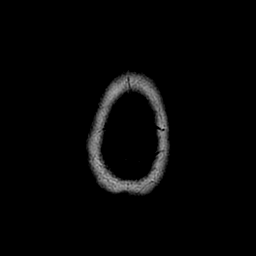

[Series 9: DWI · coronal · 4.0mm · 1.09mm/px · 6 of 86 slices shown (2 of 4)]
[im 1/86]
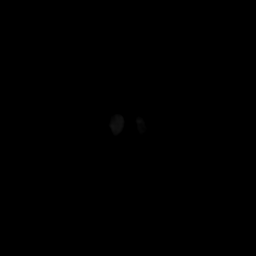
[im 18/86]
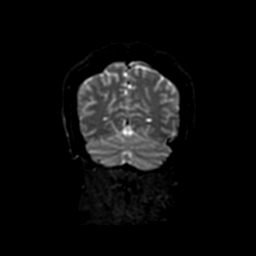
[im 35/86]
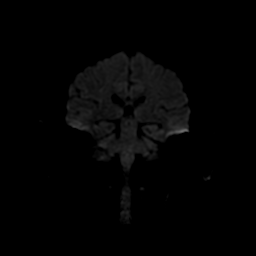
[im 52/86]
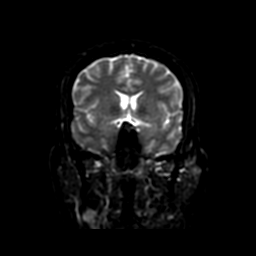
[im 69/86]
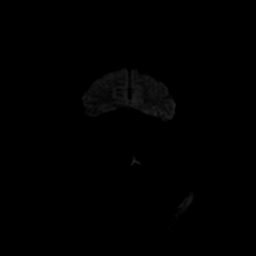
[im 86/86]
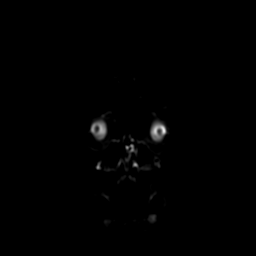

[Series 10: T2 post-contrast · coronal · 5.0mm · 0.90mm/px · 2 of 27 slices shown]
[im 1/27]
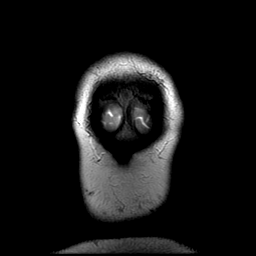
[im 27/27]
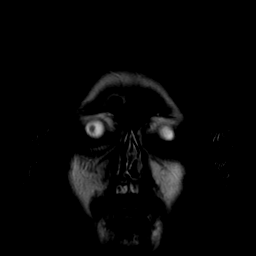

[Series 11: T1 post-contrast · axial · 1.0mm · 0.47mm/px · z∈[-67,+69]mm · 8 of 138 slices shown (1 of 3)]
[im 1/138]
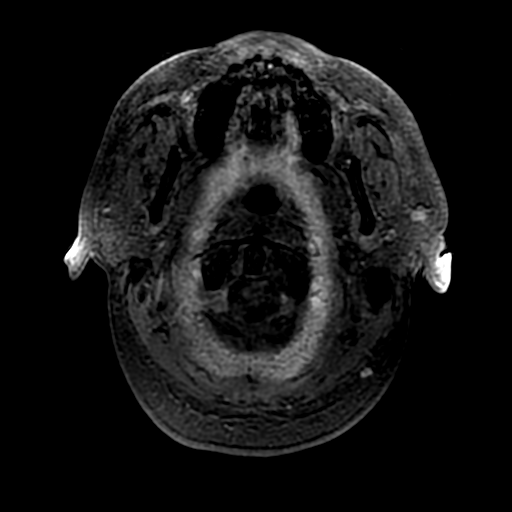
[im 18/138]
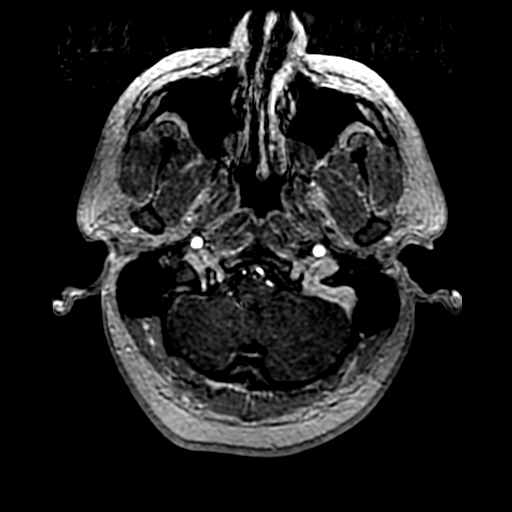
[im 35/138]
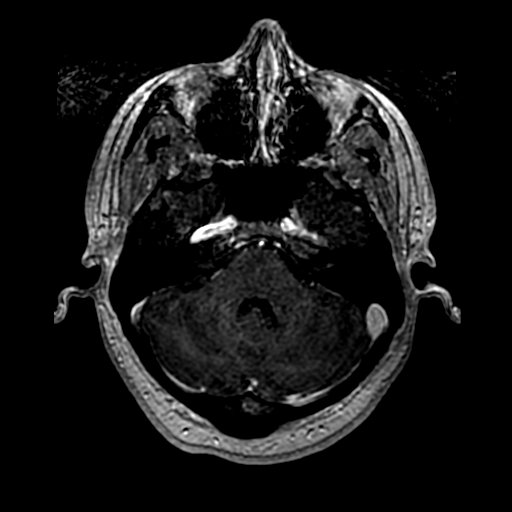
[im 52/138]
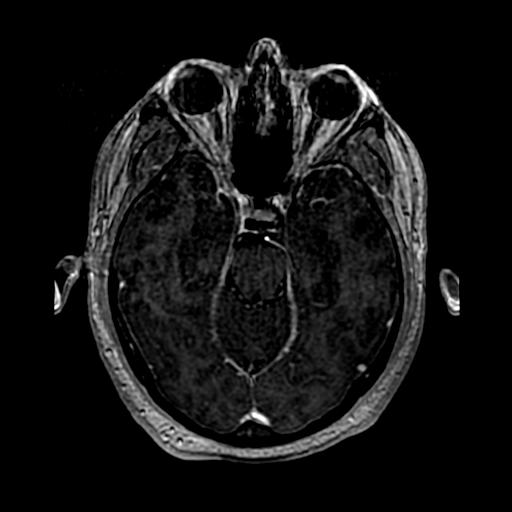
[im 86/138]
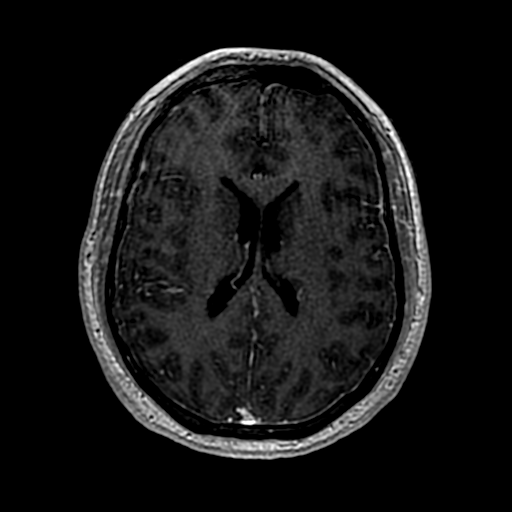
[im 103/138]
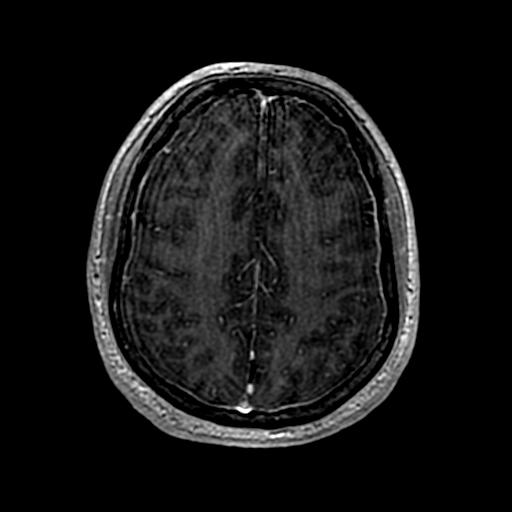
[im 120/138]
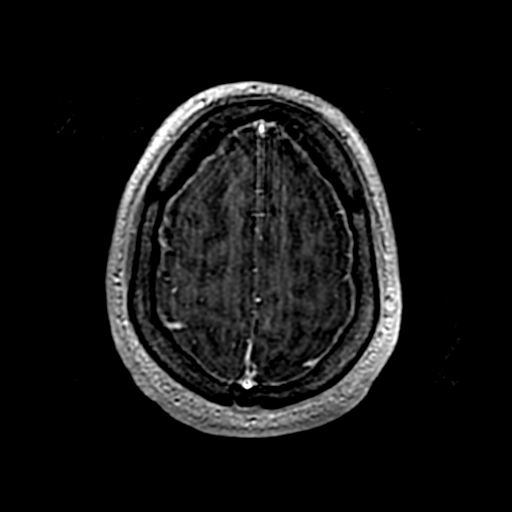
[im 138/138]
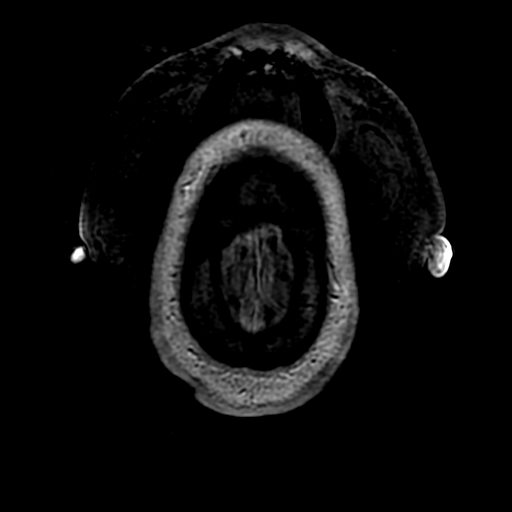

[Series 12: T1 post-contrast · coronal · 5.0mm · 0.47mm/px · 2 of 27 slices shown (2 of 3)]
[im 1/27]
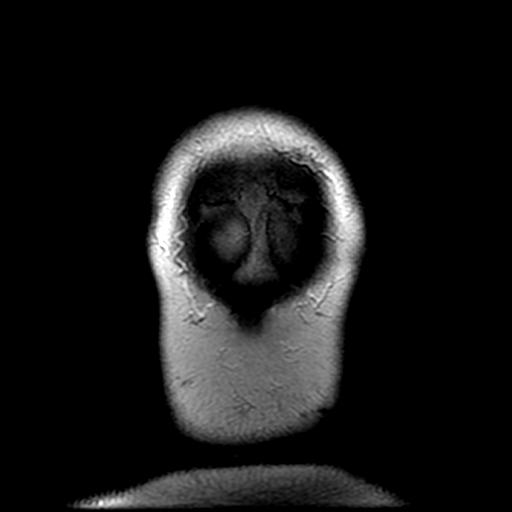
[im 27/27]
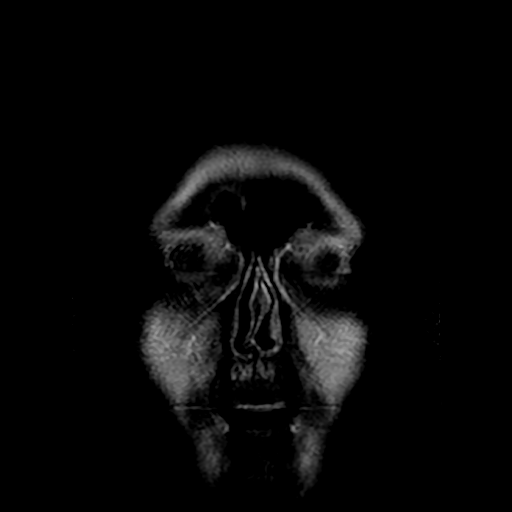

[Series 13: T1 post-contrast · sagittal · 5.0mm · 0.47mm/px · 1 of 21 slices shown (3 of 3)]
[im 1/21]
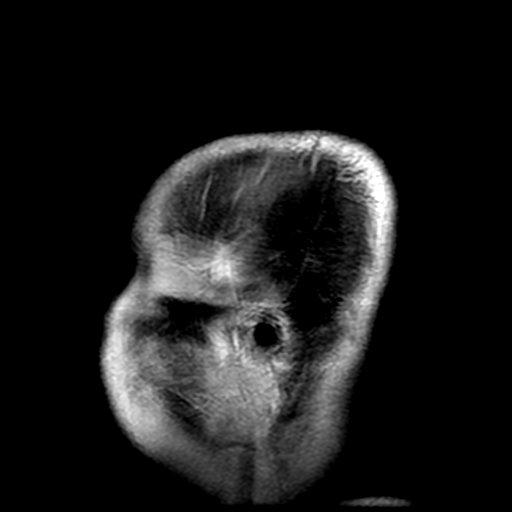

[Series 400: DWI · axial · 3.0mm · 1.09mm/px · z∈[-65,+72]mm · 3 of 47 slices shown (3 of 4)]
[im 1/47]
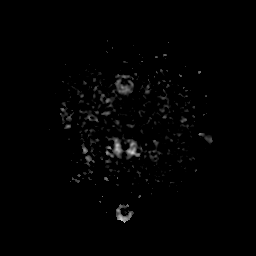
[im 24/47]
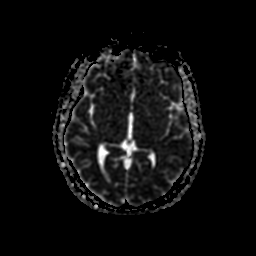
[im 47/47]
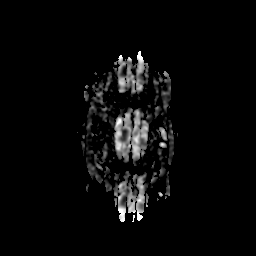

[Series 900: DWI · coronal · 4.0mm · 1.09mm/px · 3 of 43 slices shown (4 of 4)]
[im 1/43]
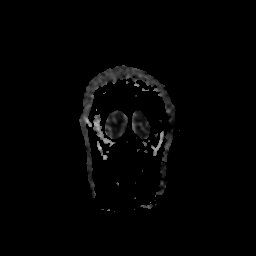
[im 22/43]
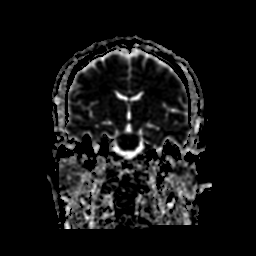
[im 43/43]
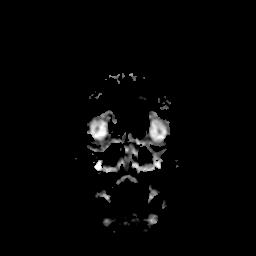

[35 of 48 positions shown; findings below may reference images not displayed]

FINDINGS: Brain: No acute infarct, hemorrhage, or mass lesion is present. No
significant white matter lesions are present. The ventricles are of
normal size. No significant extraaxial fluid collection is present.

The internal auditory canals are within normal limits. The brainstem
and cerebellum are within normal limits.

Postcontrast images demonstrate no pathologic enhancement. There is
no dural thickening or discrete fluid collection.

Vascular: Flow is present in the major intracranial arteries.

Skull and upper cervical spine: The craniocervical junction is
normal. Upper cervical spine is within normal limits. Marrow signal
is unremarkable.

Sinuses/Orbits: Mild mucosal thickening is present posteriorly in
the maxillary sinuses bilaterally. This may represent a polyp or
mucous retention cyst. No fluid levels are present. Mastoid air
cells are clear. Globes and orbits are within normal limits.
IMPRESSION: 1. Normal MRI appearance of the brain. No acute or focal lesion to
explain the patient's symptoms.
2. Minimal maxillary sinus disease.

## 2019-07-11 MED ORDER — GADOBUTROL 1 MMOL/ML IV SOLN
10.0000 mL | Freq: Once | INTRAVENOUS | Status: AC | PRN
  Administered 2019-07-11: 10 mL via INTRAVENOUS

## 2019-07-11 MED ORDER — LIDOCAINE HCL (PF) 2 % IJ SOLN
0.0000 mL | Freq: Once | INTRAMUSCULAR | Status: DC | PRN
  Filled 2019-07-11: qty 20

## 2019-07-11 MED ORDER — LIDOCAINE-EPINEPHRINE 2 %-1:100000 IJ SOLN
20.0000 mL | Freq: Once | INTRAMUSCULAR | Status: AC
  Administered 2019-07-11: 20 mL
  Filled 2019-07-11: qty 20

## 2019-07-11 NOTE — Progress Notes (Addendum)
PROGRESS NOTE                                                                                                                                                                                                             Patient Demographics:    Raymond Owens, is a 28 y.o. male, DOB - 1991/02/08, UDJ:[id number]  Admit date - 07/06/2019   Admitting Physician Therisa Doyne, MD  Outpatient Primary MD for the patient is Nolene Ebbs  LOS - 4   Chief Complaint  Patient presents with   Fever       Brief Narrative    28 years old male without significant past medical history, presents with sepsis, work-up significant for bacterial  meningitis, versus possibly RMSF, appears to be improving on vancomycin, Rocephin and doxycycline.  Remains in ICU, with significant oxygen requirement, with elevated D-dimers, CTA with worsening atypical pneumonia, no evidence of PE, patient remains febrile, cultures, urine culture, CSF cultures are negative, RMSF IgM is nonreactive as well, thoughts of autoimmune process as well especially given his rash, general surgery consulted for punch biopsy.   Subjective:    Raymond Owens today reports his dyspnea has improved, still with fever intermittently, diarrhea has improved, but still 4-5 times per day .  Ports he is feeling better and want to try walking today.    Assessment  & Plan :    Principal Problem:   Bacterial meningitis Active Problems:   Sepsis (HCC)   Fever   Headache   Rash   Acute respiratory failure with hypoxemia (HCC)  Severe sepsis -Patient remains significantly tachypneic, tachycardic, with worsening leukocytosis, -Source of sepsis could not be specifically identified, but bacterial meningitis versus RMSF infection highly on the differentials .  -Continue with IV vancomycin and Rocephin for presumed bacterial meningitis, so far cultures with no growth , lumbar puncture with >400 WBC, PMN  predominance, he does complain of photophobia, headache, typical findings with  meningitis, this was improved . -Possible RMSF, continue with IV doxycycline, RMSF IgG/IgM are negative,but negative RMSF serologies WILL NOT exclude this diagnosis per ID. -Remains quite with severe sepsis, still with significant tachypnea, tachycardia and worsening leukocytosis, will continue monitoring in ICU -ID input greatly appreciated, as well autoimmune process such as sarcoidosis is possible skin rash, and lung involvement, and it has been in  the literature there is a septic meningitis with sarcoidosis, follow a ACE level in CSF and serum. -Patient was started on steroids, significantly elevated level of inflammatory markers including ferritin, CRP and D-dimers, steroid treatment is indicated in either bacterial meningitis or sarcoidosis. -Discussed with general surgery to perform skin biopsy for his rash, to evaluate for possible sarcoidosis or vasculitis -Leukocytosis trending up, but this is most likely in the setting of steroids, CRP trending down, clinically appears to be improving, but will continue to monitor closely. -Discussed with ID, will obtain MRI brain with / without contrast Lab Results  Component Value Date   SARSCOV2NAA NEGATIVE 07/06/2019   Hypokalemia/hypophosphatemia -Repleted, continue to monitor  Acute hypoxic respiratory failure -CT findings suspicious for atypical pneumonia, he did require 6 L nasal cannula at one point, he is improving, he is on 3 L nasal cannula today. -Evidence of volume overload on imaging, he is receiving Lasix as needed, volume status much improved, will hold on giving Lasix today.  Elevated D-dimers -Most likely due to inflammation, CTA chest, venous Doppler negative for VTE  Transaminitis -due to sepsis, trending up, continue to monitor, repeat CMP in a.m.  Rash -Improving, crease clinical suspicion for RMSF  Diarrhea -Improving, will check for C.  Difficile.  Code Status : Full  Family Communication  : Wife is being updated daily  Disposition Plan  : Home  Barriers For Discharge : Remains septic, on broad-spectrum IV antibiotics  Consults  :  ID, PCCM  Procedures  : LP 11/12  DVT Prophylaxis  :  SCD, [place] heparin  Lab Results  Component Value Date   PLT 315 07/10/2019    Antibiotics  :    Anti-infectives (From admission, onward)   Start     Dose/Rate Route Frequency Ordered Stop   07/10/19 1200  vancomycin (VANCOCIN) 1,500 mg in sodium chloride 0.9 % 500 mL IVPB     1,500 mg 250 mL/hr over 120 Minutes Intravenous Every 8 hours 07/10/19 1156     07/07/19 1600  vancomycin (VANCOCIN) 1,750 mg in sodium chloride 0.9 % 500 mL IVPB  Status:  Discontinued     1,750 mg 250 mL/hr over 120 Minutes Intravenous Every 12 hours 07/07/19 0605 07/07/19 1120   07/07/19 1600  cefTRIAXone (ROCEPHIN) 2 g in sodium chloride 0.9 % 100 mL IVPB     2 g 200 mL/hr over 30 Minutes Intravenous Every 12 hours 07/07/19 1108     07/07/19 1200  vancomycin (VANCOCIN) IVPB 1000 mg/200 mL premix  Status:  Discontinued     1,000 mg 200 mL/hr over 60 Minutes Intravenous Every 8 hours 07/07/19 1120 07/10/19 1153   07/07/19 0445  doxycycline (VIBRAMYCIN) 100 mg in sodium chloride 0.9 % 250 mL IVPB     100 mg 125 mL/hr over 120 Minutes Intravenous Every 12 hours 07/07/19 0440     07/07/19 0400  vancomycin (VANCOCIN) 1,750 mg in sodium chloride 0.9 % 500 mL IVPB  Status:  Discontinued     1,750 mg 250 mL/hr over 120 Minutes Intravenous Every 8 hours 07/06/19 1605 07/06/19 2122   07/07/19 0200  vancomycin (VANCOCIN) 1,500 mg in sodium chloride 0.9 % 500 mL IVPB  Status:  Discontinued     1,500 mg 250 mL/hr over 120 Minutes Intravenous Every 8 hours 07/06/19 2122 07/07/19 0605   07/07/19 0000  ceFEPIme (MAXIPIME) 2 g in sodium chloride 0.9 % 100 mL IVPB  Status:  Discontinued     2 g 200 mL/hr  over 30 Minutes Intravenous Every 8 hours 07/06/19 1606  07/07/19 1108   07/06/19 2230  acyclovir (ZOVIRAX) 750 mg in dextrose 5 % 150 mL IVPB  Status:  Discontinued     750 mg 165 mL/hr over 60 Minutes Intravenous Every 8 hours 07/06/19 2217 07/07/19 1108   07/06/19 1700  vancomycin (VANCOCIN) IVPB 1000 mg/200 mL premix     1,000 mg 200 mL/hr over 60 Minutes Intravenous Every 1 hr x 2 07/06/19 1603 07/06/19 1828   07/06/19 1600  ceFEPIme (MAXIPIME) 2 g in sodium chloride 0.9 % 100 mL IVPB     2 g 200 mL/hr over 30 Minutes Intravenous  Once 07/06/19 1547 07/06/19 1644   07/06/19 1600  metroNIDAZOLE (FLAGYL) IVPB 500 mg     500 mg 100 mL/hr over 60 Minutes Intravenous  Once 07/06/19 1547 07/06/19 1936   07/06/19 1600  vancomycin (VANCOCIN) IVPB 1000 mg/200 mL premix  Status:  Discontinued     1,000 mg 200 mL/hr over 60 Minutes Intravenous  Once 07/06/19 1547 07/06/19 1603        Objective:   Vitals:   07/11/19 0500 07/11/19 0600 07/11/19 0700 07/11/19 0800  BP: 129/72 (!) 114/57 (!) 144/93 (!) 142/74  Pulse: (!) 117 (!) 117 (!) 116 (!) 114  Resp: (!) 46 (!) 41 (!) 35 (!) 25  Temp:    (!) 100.4 F (38 C)  TempSrc:    Oral  SpO2: 99% 99% 95% 97%  Weight:      Height:        Wt Readings from Last 3 Encounters:  07/11/19 116.3 kg  05/25/19 112.5 kg  05/20/18 103.4 kg     Intake/Output Summary (Last 24 hours) at 07/11/2019 1038 Last data filed at 07/11/2019 1035 Gross per 24 hour  Intake 2573.5 ml  Output 3450 ml  Net -876.5 ml     Physical Exam  Awake Alert, Oriented X 3, No new F.N deficits, Normal affect Symmetrical Chest wall movement, diminished air entry at the bases, no wheezing Mildly tachycardic,No Gallops,Rubs or new Murmurs, No Parasternal Heave +ve B.Sounds, Abd Soft, No tenderness, No rebound - guarding or rigidity. No Cyanosis, Clubbing or edema, No new Rash or bruise       Data Review:    CBC Recent Labs  Lab 07/06/19 1524 07/07/19 0546 07/08/19 0258 07/09/19 0156 07/10/19 0716  WBC 28.7*  26.7* 26.2* 30.6* 42.2*  HGB 14.4 13.1 12.1* 12.9* 12.1*  HCT 42.4 39.0 35.7* 38.4* 36.2*  PLT 234 240 240 288 315  MCV 87.1 89.4 88.4 89.5 90.5  MCH 29.6 30.0 30.0 30.1 30.3  MCHC 34.0 33.6 33.9 33.6 33.4  RDW 13.0 13.6 13.7 13.8 14.4  LYMPHSABS 1.0  --  1.4 1.7 1.6  MONOABS 1.7*  --  1.8* 1.3* 1.9*  EOSABS 0.0  --  0.0 0.0 0.0  BASOSABS 0.1  --  0.1 0.2* 0.0    Chemistries  Recent Labs  Lab 07/06/19 1524 07/06/19 1526 07/07/19 0546 07/08/19 0258 07/09/19 0156 07/09/19 1437 07/10/19 0716 07/11/19 0341  NA 132*  --  134* 136 133* 135 134* 138  K 2.9*  --  3.1* 3.2* 3.0* 3.1* 3.3* 4.2  CL 97*  --  100 104 102 104 102 110  CO2 22  --  20*  GLUCOSE 144*  --  126* 100* 138* 124* 136* 111*  BUN 19  --  23*  CREATININE 0.86  --  0.89 0.74 0.89 0.89 0.70 0.83  CALCIUM 8.7*  --  7.8* 8.0* 7.7* 7.9* 7.6* 6.8*  MG  --  1.7  --  1.9  --  2.1  --   --   AST 45*  --  27  --   --   --  122* 166*  ALT 54*  --  46*  --   --   --  135* 203*  ALKPHOS 75  --  88  --   --   --  79 79  BILITOT 0.9  --  1.3*  --   --   --  0.8 0.8   ------------------------------------------------------------------------------------------------------------------ No results for input(s): CHOL, HDL, LDLCALC, TRIG, CHOLHDL, LDLDIRECT in the last 72 hours.  No results found for: HGBA1C ------------------------------------------------------------------------------------------------------------------ No results for input(s): TSH, T4TOTAL, T3FREE, THYROIDAB in the last 72 hours.  Invalid input(s): FREET3 ------------------------------------------------------------------------------------------------------------------ Recent Labs    07/09/19 0909 07/10/19 0716  FERRITIN 1,019* 1,910*    Coagulation profile Recent Labs  Lab 07/06/19 1524  INR 1.4*    Recent Labs    07/08/19 1155  DDIMER 3.06*    Cardiac Enzymes Recent Labs  Lab 07/07/19 0546  CKMB 2.7    ------------------------------------------------------------------------------------------------------------------ No results found for: BNP  Inpatient Medications  Scheduled Meds:  chlorhexidine  15 mL Mouth Rinse BID   Chlorhexidine Gluconate Cloth  6 each Topical Daily   famotidine  20 mg Oral BID   furosemide  40 mg Intravenous Once   Gerhardt's butt cream  1 application Topical TID   heparin injection (subcutaneous)  5,000 Units Subcutaneous Q8H   lactobacillus  1 g Oral TID WC   lidocaine-EPINEPHrine  20 mL Infiltration Once   mouth rinse  15 mL Mouth Rinse q12n4p   pantoprazole  40 mg Oral BID AC   predniSONE  60 mg Oral Daily   sodium chloride flush  10-40 mL Intracatheter Q12H   Continuous Infusions:  sodium chloride Stopped (07/07/19 0438)   sodium chloride Stopped (07/10/19 0308)   cefTRIAXone (ROCEPHIN)  IV Stopped (07/11/19 0418)   doxycycline (VIBRAMYCIN) IV Stopped (07/11/19 0954)   sodium chloride     vancomycin Stopped (07/11/19 0626)   PRN Meds:.sodium chloride, sodium chloride, acetaminophen **OR** acetaminophen, hydrALAZINE, lidocaine, loperamide, ondansetron (ZOFRAN) IV, simethicone, sodium chloride flush  Micro Results Recent Results (from the past 240 hour(s))  Culture, blood (Routine x 2)     Status: None   Collection Time: 07/06/19  3:25 PM   Specimen: BLOOD  Result Value Ref Range Status   Specimen Description   Final    BLOOD RIGHT ANTECUBITAL Performed at [facility], [address] [place], [place] [address]    Special Requests   Final    BOTTLES DRAWN AEROBIC AND ANAEROBIC Blood Culture adequate volume Performed at [facility], [address] [place], [place] [address]    Culture   Final    NO GROWTH 5 DAYS Performed at [facility], [address], [place], [place] [address]    Report Status 07/11/2019 FINAL  Final  SARS CORONAVIRUS 2 (TAT 6-24 HRS) Nasopharyngeal Nasopharyngeal Swab      Status: None   Collection Time: 07/06/19  3:39 PM   Specimen: Nasopharyngeal Swab  Result Value Ref Range Status   SARS Coronavirus 2 NEGATIVE NEGATIVE Final    Comment: (NOTE) SARS-CoV-2 target nucleic acids are NOT DETECTED. The SARS-CoV-2 RNA is generally detectable in upper and lower respiratory  specimens during the acute phase of infection. Negative results do not preclude SARS-CoV-2 infection, do not rule out co-infections with other pathogens, and should not be used as the sole basis for treatment or other patient management decisions. Negative results must be combined with clinical observations, patient history, and epidemiological information. The expected result is Negative. Fact Sheet for Patients: HairSlick.no Fact Sheet for Healthcare Providers: quierodirigir.com This test is not yet approved or cleared by the [place] FDA and  has been authorized for detection and/or diagnosis of SARS-CoV-2 by FDA under an Emergency Use Authorization (EUA). This EUA will remain  in effect (meaning this test can be used) for the duration of the COVID-19 declaration under Section 56 4(b)(1) of the Act, 21 U.S.C. section 360bbb-3(b)(1), unless the authorization is terminated or revoked sooner. Performed at [facility], [address], [place], [place] [address]   Culture, blood (Routine x 2)     Status: None   Collection Time: 07/06/19  4:00 PM   Specimen: BLOOD  Result Value Ref Range Status   Specimen Description   Final    BLOOD RIGHT WRIST Performed at [facility], [address] [place], [place] [address]    Special Requests   Final    BOTTLES DRAWN AEROBIC AND ANAEROBIC Blood Culture adequate volume Performed at [facility], [address] [place], [place] [address]    Culture   Final    NO GROWTH 5 DAYS Performed at [facility], [address], [place], [place] [address]     Report Status 07/11/2019 FINAL  Final  Urine culture     Status: None   Collection Time: 07/06/19  4:30 PM   Specimen: Urine, Random  Result Value Ref Range Status   Specimen Description   Final    URINE, RANDOM Performed at [facility], [address] [place], [place] [address]    Special Requests   Final    NONE Performed at [facility], [address] [place], [place] [address]    Culture   Final    NO GROWTH Performed at [facility], [address] [place], [place] [address]    Report Status 07/08/2019 FINAL  Final  Respiratory Panel by PCR     Status: None   Collection Time: 07/06/19  9:42 PM   Specimen: Nasopharyngeal Swab; Respiratory  Result Value Ref Range Status   Adenovirus NOT DETECTED NOT DETECTED Final   Coronavirus 229E NOT DETECTED NOT DETECTED Final    Comment: (NOTE) The Coronavirus on the Respiratory Panel, DOES NOT test for the novel  Coronavirus (2019 nCoV)    Coronavirus HKU1 NOT DETECTED NOT DETECTED Final   Coronavirus NL63 NOT DETECTED NOT DETECTED Final   Coronavirus OC43 NOT DETECTED NOT DETECTED Final   Metapneumovirus NOT DETECTED NOT DETECTED Final   Rhinovirus / Enterovirus NOT DETECTED NOT DETECTED Final   Influenza A NOT DETECTED NOT DETECTED Final   Influenza B NOT DETECTED NOT DETECTED Final   Parainfluenza Virus 1 NOT DETECTED NOT DETECTED Final   Parainfluenza Virus 2 NOT DETECTED NOT DETECTED Final   Parainfluenza Virus 3 NOT DETECTED NOT DETECTED Final   Parainfluenza Virus 4 NOT DETECTED NOT DETECTED Final   Respiratory Syncytial Virus NOT DETECTED NOT DETECTED Final   Bordetella pertussis NOT DETECTED NOT DETECTED Final   Chlamydophila pneumoniae NOT DETECTED NOT DETECTED Final   Mycoplasma pneumoniae NOT  DETECTED NOT DETECTED Final    Comment: Performed at [facility], [address] [place], [place] [address]  MRSA PCR Screening     Status: None   Collection Time: 07/07/19  3:32 AM    Specimen: Nasal Mucosa; Nasopharyngeal  Result Value Ref Range Status   MRSA by PCR NEGATIVE NEGATIVE Final    Comment:        The GeneXpert MRSA Assay (FDA approved for NASAL specimens only), is one component of a comprehensive MRSA colonization surveillance program. It is not intended to diagnose MRSA infection nor to guide or monitor treatment for MRSA infections. Performed at [facility], [address] [place], [place] [address]   CSF culture     Status: None   Collection Time: 07/07/19  1:22 PM   Specimen: PATH Cytology CSF; Cerebrospinal Fluid  Result Value Ref Range Status   Specimen Description   Final    CSF Performed at [facility], [address] [place], [place] [address]    Special Requests   Final    NONE Performed at [facility], [address] [place], [place] [address]    Gram Stain   Final    CYTOSPIN SMEAR WBC PRESENT,BOTH PMN AND MONONUCLEAR NO ORGANISMS SEEN Gram Stain Report Called to,Read Back By and Verified With: HEAVNER,A. RN @1555  ON 11.12.2020 BY COHEN,K Performed at [facility], [address] [place], [place] [address]    Culture   Final    NO GROWTH 3 DAYS Performed at [facility], [address] [place], [place] [address]    Report Status 07/11/2019 FINAL  Final  C difficile quick scan w PCR reflex     Status: None   Collection Time: 07/09/19  2:30 PM   Specimen: STOOL  Result Value Ref Range Status   C Diff antigen NEGATIVE NEGATIVE Final   C Diff toxin NEGATIVE NEGATIVE Final   C Diff interpretation No C. difficile detected.  Final    Comment: Performed at [facility], [address] [place], [place] [address]    Radiology Reports Dg Chest 1 View  Result Date: 07/07/2019 CLINICAL DATA:  Fever, COVID positive EXAM: CHEST  1 VIEW COMPARISON:  July 06, 2019 FINDINGS: There is consolidation at the right lung base.  Possible retrocardiac opacity as well. Mild interstitial prominence. No significant pleural effusion. Heart size is likely within normal limits for portable technique. IMPRESSION: Consolidation at the right lung base suspicious for pneumonia. Possible retrocardiac opacity as well Electronically Signed   By: Guadlupe SpanishPraneil  Patel M.D.   On: 07/07/2019 10:07   Ct Head Wo Contrast  Result Date: 07/06/2019 CLINICAL DATA:  Altered mental status EXAM: CT HEAD WITHOUT CONTRAST TECHNIQUE: Contiguous axial images were obtained from the base of the skull through the vertex without intravenous contrast. COMPARISON:  None. FINDINGS: Brain: No evidence of acute infarction, hemorrhage, hydrocephalus, extra-axial collection or mass lesion/mass effect. Vascular: No hyperdense vessel or unexpected calcification. Skull: Normal. Negative for fracture or focal lesion. Sinuses/Orbits: No acute finding. Other: None. IMPRESSION: No acute intracranial pathology. Electronically Signed   By: Lauralyn PrimesAlex  Bibbey M.D.   On: 07/06/2019 17:28   Ct Chest Wo Contrast  Result Date: 07/06/2019 CLINICAL DATA:  Chest pain. Fever and shortness of breath. Abnormal lung base findings on abdominal CT. EXAM: CT CHEST WITHOUT CONTRAST TECHNIQUE: Multidetector CT imaging of the chest was performed following the standard protocol without IV contrast.  COMPARISON:  Chest radiograph earlier today. Lung bases from abdominal CT earlier today. FINDINGS: Cardiovascular: Thoracic aorta is normal in caliber. No pericardial effusion. Upper normal heart size. Mediastinum/Nodes: Small mediastinal lymph nodes not enlarged by size criteria. Limited assessment for hilar adenopathy given lack of IV contrast. The esophagus is decompressed. No visualized thyroid nodule. Lungs/Pleura: Mild smooth septal thickening. Scattered ill-defined small pulmonary nodules and ground-glass opacities in both lungs, slight dependent and lower lobe predominant. No cavitary component Fissural  thickening/fluid in the fissure with trace bilateral pleural effusions. Trachea and mainstem bronchi are patent. Mild bronchial thickening in the lower lobes. Upper Abdomen: Hepatic steatosis. Assessed in full on dedicated abdominal CT earlier this day. Musculoskeletal: There are no acute or suspicious osseous abnormalities. IMPRESSION: 1. Mild septal thickening suspicious for pulmonary edema. Trace bilateral pleural effusions. 2. Scattered ill-defined tiny pulmonary nodules in conjunction with ground-glass opacities in both lungs. Favor atypical infectious or other inflammatory etiology, (not classic for COVID-19 pneumonia). Ground-glass opacity component may represent either edema or atypical infection. Consider follow-up CT after course of treatment to ensure resolution. . Electronically Signed   By: Keith Rake M.D.   On: 07/06/2019 19:33   Ct Angio Chest Pe W Or Wo Contrast  Result Date: 07/08/2019 CLINICAL DATA:  Elevated D-dimer, hypoxia, tachycardia EXAM: CT ANGIOGRAPHY CHEST WITH CONTRAST TECHNIQUE: Multidetector CT imaging of the chest was performed using the standard protocol during bolus administration of intravenous contrast. Multiplanar CT image reconstructions and MIPs were obtained to evaluate the vascular anatomy. CONTRAST:  114mL OMNIPAQUE IOHEXOL 350 MG/ML SOLN COMPARISON:  07/06/2019 FINDINGS: Cardiovascular: Examination is somewhat limited by breath motion artifact, particularly in the bilateral lower lobes. Within this limitation, there is no evidence of pulmonary embolism through the segmental pulmonary arterial level. Mild cardiomegaly. Trace pericardial effusion. Mediastinum/Nodes: No enlarged mediastinal, hilar, or axillary lymph nodes. Thyroid gland, trachea, and esophagus demonstrate no significant findings. Lungs/Pleura: Extensive bilateral ground-glass and consolidative airspace opacity, which is new compared to prior examination. Diffuse interlobular septal thickening,  similar to prior examination. Small bilateral pleural effusions, new compared to prior exam. Upper Abdomen: No acute abnormality.  Hepatic steatosis. Musculoskeletal: No chest wall abnormality. No acute or significant osseous findings. Review of the MIP images confirms the above findings. IMPRESSION: 1. Examination is somewhat limited by breath motion artifact, particularly in the bilateral lower lobes. Within this limitation, there is no evidence of pulmonary embolism through the segmental pulmonary arterial level. 2. Extensive bilateral ground-glass and consolidative airspace opacity, which is new compared to prior examination. Findings are consistent with multifocal infection. 3. Diffuse interlobular septal thickening, similar to prior examination, and consistent with pulmonary edema. 4. Small bilateral pleural effusions, new compared to prior examination. 5.  Cardiomegaly and trace pericardial effusion. Electronically Signed   By: Eddie Candle M.D.   On: 07/08/2019 16:39   Mr Lumbar Spine W Wo Contrast  Result Date: 07/07/2019 CLINICAL DATA:  Back pain, fever. EXAM: MRI LUMBAR SPINE WITHOUT AND WITH CONTRAST TECHNIQUE: Multiplanar and multiecho pulse sequences of the lumbar spine were obtained without and with intravenous contrast. CONTRAST:  4mL GADAVIST GADOBUTROL 1 MMOL/ML IV SOLN COMPARISON:  X-ray 05/25/2019 FINDINGS: Segmentation:  Standard. Alignment:  Physiologic. Vertebrae: No fracture, evidence of discitis, or bone lesion. No abnormal enhancement on postcontrast sequence. Conus medullaris and cauda equina: Conus extends to the L1-L2 level. Conus and cauda equina appear normal. Paraspinal and other soft tissues: Negative. Disc levels: T12-L1: Unremarkable. L1-L2: Unremarkable. L2-L3: Unremarkable. L3-L4: Unremarkable. L4-L5: Unremarkable. L5-S1:  Minimal degenerative endplate spurring with mild bilateral facet arthrosis resultant mild bilateral foraminal stenosis. No canal stenosis. IMPRESSION: 1.  No MR evidence for discitis-osteomyelitis. 2. Mild facet arthropathy with minimal degenerative endplate spurring at L5-S1 with mild bilateral foraminal stenosis. Electronically Signed   By: Duanne Guess M.D.   On: 07/07/2019 12:18   Ct Abdomen Pelvis W Contrast  Result Date: 07/06/2019 CLINICAL DATA:  Fever, headache, nausea, vomiting, abdominal pain EXAM: CT ABDOMEN AND PELVIS WITH CONTRAST TECHNIQUE: Multidetector CT imaging of the abdomen and pelvis was performed using the standard protocol following bolus administration of intravenous contrast. CONTRAST:  OMNIPAQUE IOHEXOL 300 MG/ML  SOLN COMPARISON:  None. FINDINGS: Lower chest: No acute abnormality. Scattered small pulmonary nodules in the dependent bilateral lung bases with some evidence of septal thickening. Trace bilateral pleural effusions. Hepatobiliary: No solid liver abnormality is seen. Hepatic steatosis. No gallstones, gallbladder wall thickening, or biliary dilatation. Pancreas: Unremarkable. No pancreatic ductal dilatation or surrounding inflammatory changes. Spleen: Normal in size without significant abnormality. Adrenals/Urinary Tract: Adrenal glands are unremarkable. Kidneys are normal, without renal calculi, solid lesion, or hydronephrosis. Bladder is unremarkable. Stomach/Bowel: Stomach is within normal limits. Appendix appears normal. No evidence of bowel wall thickening, distention, or inflammatory changes. Vascular/Lymphatic: No significant vascular findings are present. No enlarged abdominal or pelvic lymph nodes. Reproductive: No mass or other significant abnormality. Other: No abdominal wall hernia or abnormality. Trace ascites in the low abdomen (series 2, image 82). Musculoskeletal: No acute or significant osseous findings. IMPRESSION: 1. No definite CT findings of the abdomen or pelvis to explain symptoms or pain. 2. Trace ascites in the low abdomen, of uncertain etiology without obvious inflammatory findings. 3.   Hepatic steatosis. 4. Scattered small pulmonary nodules in the dependent bilateral lung bases with some evidence of septal thickening. Trace bilateral pleural effusions. Findings favor atypical infection and a possible component of pulmonary edema. Electronically Signed   By: Lauralyn Primes M.D.   On: 07/06/2019 17:27   Dg Chest Port 1 View  Result Date: 07/06/2019 CLINICAL DATA:  Fever with headache, nausea and vomiting 4 days. EXAM: PORTABLE CHEST 1 VIEW COMPARISON:  None. FINDINGS: Lungs are adequately inflated and otherwise clear. Cardiomediastinal silhouette and remainder of the exam is within normal. IMPRESSION: No active disease. Electronically Signed   By: Elberta Fortis M.D.   On: 07/06/2019 15:52   Vas [place] Lower Extremity Venous (dvt)  Result Date: 07/08/2019  Lower Venous Study Indications: Edema, and elevated ddimer.  Performing Technologist: Blanch Media RVS  Examination Guidelines: A complete evaluation includes B-mode imaging, spectral Doppler, color Doppler, and power Doppler as needed of all accessible portions of each vessel. Bilateral testing is considered an integral part of a complete examination. Limited examinations for reoccurring indications may be performed as noted.  +---------+---------------+---------+-----------+----------+--------------+  RIGHT     Compressibility Phasicity Spontaneity Properties Thrombus Aging  +---------+---------------+---------+-----------+----------+--------------+  CFV       Full            Yes       Yes                                    +---------+---------------+---------+-----------+----------+--------------+  SFJ       Full                                                             +---------+---------------+---------+-----------+----------+--------------+  FV Prox   Full                                                             +---------+---------------+---------+-----------+----------+--------------+  FV Mid    Full                                                              +---------+---------------+---------+-----------+----------+--------------+  FV Distal Full                                                             +---------+---------------+---------+-----------+----------+--------------+  PFV       Full                                                             +---------+---------------+---------+-----------+----------+--------------+  POP       Full            Yes       Yes                                    +---------+---------------+---------+-----------+----------+--------------+  PTV       Full                                                             +---------+---------------+---------+-----------+----------+--------------+  PERO      Full                                                             +---------+---------------+---------+-----------+----------+--------------+   +---------+---------------+---------+-----------+----------+--------------+  LEFT      Compressibility Phasicity Spontaneity Properties Thrombus Aging  +---------+---------------+---------+-----------+----------+--------------+  CFV       Full            Yes       Yes                                    +---------+---------------+---------+-----------+----------+--------------+  SFJ       Full                                                             +---------+---------------+---------+-----------+----------+--------------+  FV Prox   Full                                                             +---------+---------------+---------+-----------+----------+--------------+  FV Mid    Full                                                             +---------+---------------+---------+-----------+----------+--------------+  FV Distal Full                                                             +---------+---------------+---------+-----------+----------+--------------+  PFV       Full                                                              +---------+---------------+---------+-----------+----------+--------------+  POP       Full            Yes       Yes                                    +---------+---------------+---------+-----------+----------+--------------+  PTV       Full                                                             +---------+---------------+---------+-----------+----------+--------------+  PERO      Full                                                             +---------+---------------+---------+-----------+----------+--------------+     Summary: Right: There is no evidence of deep vein thrombosis in the lower extremity. No cystic structure found in the popliteal fossa. Left: There is no evidence of deep vein thrombosis in the lower extremity. No cystic structure found in the popliteal fossa.  *See table(s) above for measurements and observations. Electronically signed by Sherald Hess MD on 07/08/2019 at 3:27:25 PM.    Final    Dg Fl Guided Lumbar Puncture  Result Date: 07/07/2019 CLINICAL DATA:  Fever. EXAM: DIAGNOSTIC LUMBAR PUNCTURE UNDER FLUOROSCOPIC GUIDANCE FLUOROSCOPY TIME:  Fluoroscopy Time:  0.3 minutes Radiation Exposure Index (if provided by the fluoroscopic device): 24.80 mGy Number of Acquired Spot Images: 1 PROCEDURE: Informed consent was obtained from the patient prior  to the procedure, including potential complications of headache, allergy, and pain. With the patient prone, the lower back was prepped with Betadine. 1% Lidocaine was used for local anesthesia. Lumbar puncture was performed at the L3-L4 level using a 20 gauge needle with return of clear CSF with an opening pressure of 9 cm water. 12 ml of CSF were obtained for laboratory studies. The patient tolerated the procedure well and there were no apparent complications. IMPRESSION: Successful L3-L4 lumbar puncture without immediate postprocedure complication. 12 mL CSF obtained for laboratory studies. Electronically Signed   By: Jackey Loge  DO   On: 07/07/2019 13:07   US Abdomen Limited Ruq  Result Date: 07/07/2019 CLINICAL DATA:  Abnormal liver function tests. EXAM: ULTRASOUND ABDOMEN LIMITED RIGHT UPPER QUADRANT COMPARISON:  July 06, 2019. FINDINGS: Gallbladder: No gallstones or wall thickening visualized. No sonographic Murphy sign noted by sonographer. Common bile duct: Diameter: 4 mm which is within normal limits. Liver: Increased echogenicity of hepatic parenchyma is noted. 6 cm low density is noted is noted within the left hepatic lobe of uncertain etiology. No corresponding abnormality is seen on the CT scan of the previous day. Portal vein is patent on color Doppler imaging with normal direction of blood flow towards the liver. Other: Small amount of perihepatic fluid is noted. IMPRESSION: Increased echogenicity of hepatic parenchyma is noted consistent with hepatic steatosis. 6 cm anechoic structure is noted within the left hepatic lobe that is not visualized on CT scan performed the previous day; etiology is unknown, but abscess cannot be excluded although unlikely. Further evaluation with repeat CT scan, or MRI if the patient can hold still and follow breathing instructions, is recommended. Electronically Signed   By: Lupita Raider M.D.   On: 07/07/2019 07:39     Huey Bienenstock M.D on 07/11/2019 at 10:38 AM  Between 7am to 7pm - Pager - [phone]  After 7pm go to [email or web address] - password [facility]  [facility] -  Office  [phone]

## 2019-07-11 NOTE — Consult Note (Addendum)
[facility] Surgery Consult  Note  Raymond Owens 1991-02-15  [id number].    Requesting MD: D Elgergawy Chief Complaint: Sepsis Reason for Consult:   HPI:  Patient is a 28 year old male who presented to the ED.  His wife reported fever, headaches, nausea and vomiting x4 days.  He had taken both Tylenol and ibuprofen without improvement.  He was alert and oriented but lethargic.  Simpson started on 11/14 and had become acutely worse over the next 24 hours he was short of breath, weak and fatigued.  On admission he had a temperature of 105 with some confusion.  He had an episode of vomiting and his nose was bleeding.  He also had a new rash which was diffuse and worse on his back.  He denied any sick contacts but his wife states she had a low-grade fever body aches and her kids also had similar symptoms.  Work-up shows admission temperature at 11:00 yesterday was 104.8.  It is dropped down and his last temperature at 0 600 was 99.2.  He had intermittent spikes in the evening with a T-max of 103.2 around 3 AM this morning.  He was placed on high flow nasal cannula and then BiPAP currently he is back on a nasal cannula.  Labs shows significant WBC elevation.  WBC: 30.6>>42.2>>34.8.  He was admitted by medicine with sepsis, possible meningitis, rash, multiple lung nodules bilaterally at the base.  Worsening hypoxia and possible early ARDS development.  He was seen by infectious disease and his antibiotics were adjusted he was also placed on acyclovir.  LP was performed cell count and cultures are pending.  Blood cultures were obtained a TTE has been ordered.  HIV RPR pending.  Serologies for [facility] spotted fever and Ehrlichia pending.  We are ask to see for skin biopsy.  ROS: Review of Systems  Constitutional: Positive for fever and malaise/fatigue. Negative for chills and weight loss.  HENT: Negative.   Eyes: Negative.   Respiratory: Negative.   Cardiovascular: Negative.   Gastrointestinal:  Positive for nausea and vomiting.  Genitourinary: Negative.   Musculoskeletal: Negative.   Skin: Positive for rash.  Neurological: Negative.   Endo/Heme/Allergies: Negative.   Psychiatric/Behavioral: Negative.     Family History  Problem Relation Age of Onset  . Diabetes Mother   . Heart attack Maternal Uncle   . Cancer Maternal Grandmother   . Cancer Maternal Grandfather   . Cancer Paternal Grandmother   . Cancer Paternal Grandfather     Past Medical History:  Diagnosis Date  . Hyperlipidemia     History reviewed. No pertinent surgical history.  Social History:  reports that he quit smoking about 7 years ago. He has never used smokeless tobacco. He reports current alcohol use. He reports that he does not use drugs.  Allergies:  Allergies  Allergen Reactions  . Penicillins Rash    Did it involve swelling of the face/tongue/throat, SOB, or low BP? No Did it involve sudden or severe rash/hives, skin peeling, or any reaction on the inside of your mouth or nose? Yes Did you need to seek medical attention at a hospital or doctor's office? No When did it last happen?unknown If all above answers are "NO", may proceed with cephalosporin use.     Medications Prior to Admission  Medication Sig Dispense Refill  . acetaminophen (TYLENOL) 500 MG tablet Take 1,000 mg by mouth every 6 (six) hours as needed for mild pain, moderate pain or headache.    . ibuprofen (  ADVIL) 200 MG tablet Take 400 mg by mouth every 6 (six) hours as needed for headache, mild pain, moderate pain or cramping.    . cyclobenzaprine (FLEXERIL) 10 MG tablet Take 1 tablet (10 mg total) by mouth 3 (three) times daily as needed for muscle spasms. (Patient not taking: Reported on 07/07/2019) 30 tablet 0  . diclofenac sodium (VOLTAREN) 1 % GEL Apply 4 g topically 4 (four) times daily. To affected joint. (Patient not taking: Reported on 07/07/2019) 100 g 1  . meloxicam (MOBIC) 15 MG tablet Take 1 tablet (15 mg  total) by mouth daily. (Patient not taking: Reported on 07/07/2019) 30 tablet 1    Blood pressure (!) 142/74, pulse (!) 114, temperature (!) 100.4 F (38 C), temperature source Oral, resp. rate (!) 25, height  (1.803 m), weight 116.3 kg, SpO2 97 %. Physical Exam: Physical Exam Constitutional:      General: He is not in acute distress.    Appearance: Normal appearance. He is normal weight. He is not ill-appearing, toxic-appearing or diaphoretic.  HENT:     Head: Normocephalic and atraumatic.     Mouth/Throat:     Pharynx: Oropharynx is clear.  Eyes:     General: No scleral icterus.    Conjunctiva/sclera: Conjunctivae normal.     Comments: Pupils are equal  Neck:     Musculoskeletal: Normal range of motion and neck supple.  Cardiovascular:     Rate and Rhythm: Normal rate and regular rhythm.     Heart sounds: No murmur.  Pulmonary:     Effort: Pulmonary effort is normal.     Breath sounds: Normal breath sounds.  Abdominal:     General: Abdomen is flat. Bowel sounds are normal.     Palpations: Abdomen is soft.  Musculoskeletal: Normal range of motion.  Skin:    General: Skin is warm and dry.     Capillary Refill: Capillary refill takes less than 2 seconds.     Findings: Rash present.     Comments: Rashes all over.  Dr. Randol Kern has marked a spot on his right shoulder for [place] to biopsy.  Neurological:     General: No focal deficit present.     Mental Status: He is alert and oriented to person, place, and time.     Cranial Nerves: No cranial nerve deficit.  Psychiatric:        Mood and Affect: Mood normal.        Behavior: Behavior normal.        Thought Content: Thought content normal.        Judgment: Judgment normal.     Results for orders placed or performed during the hospital encounter of 07/06/19 (from the past 48 hour(s))  C difficile quick scan w PCR reflex     Status: None   Collection Time: 07/09/19  2:30 PM   Specimen: STOOL  Result Value Ref Range   C  Diff antigen NEGATIVE NEGATIVE   C Diff toxin NEGATIVE NEGATIVE   C Diff interpretation No C. difficile detected.     Comment: Performed at [facility], [address] [place], [place] [address]  Vancomycin, peak     Status: Abnormal   Collection Time: 07/09/19  2:37 PM  Result Value Ref Range   Vancomycin Pk 20 (L) 30 - 40 ug/mL    Comment: Performed at [facility], [address], [place], [place] [address]  BMP in AM     Status:  Abnormal   Collection Time: 07/09/19  2:37 PM  Result Value Ref Range   Sodium 135 135 - 145 mmol/L   Potassium 3.1 (L) 3.5 - 5.1 mmol/L   Chloride 104 98 - 111 mmol/L   CO2 23 22 - 32 mmol/L   Glucose, Bld 124 (H) 70 - 99 mg/dL   BUN 16 6 - 20 mg/dL   Creatinine, Ser 8.65 0.61 - 1.24 mg/dL   Calcium 7.9 (L) 8.9 - 10.3 mg/dL   GFR calc non Af Amer >60 >60 mL/min   GFR calc Af Amer >60 >60 mL/min   Anion gap 8 5 - 15    Comment: Performed at [facility], [address], [place], [place] [address]  Magnesium in AM     Status: None   Collection Time: 07/09/19  2:37 PM  Result Value Ref Range   Magnesium 2.1 1.7 - 2.4 mg/dL    Comment: Performed at [facility], [address], [place], [place] [address]  CBC with Differential/Platelet     Status: Abnormal   Collection Time: 07/10/19  7:16 AM  Result Value Ref Range   WBC 42.2 (H) 4.0 - 10.5 K/uL   RBC 4.00 (L) 4.22 - 5.81 MIL/uL   Hemoglobin 12.1 (L) 13.0 - 17.0 g/dL   HCT 84.1 (L) 32.4 - 40.1 %   MCV 90.5 80.0 - 100.0 fL   MCH 30.3 26.0 - 34.0 pg   MCHC 33.4 30.0 - 36.0 g/dL   RDW 02.7 25.3 - 66.4 %   Platelets 315 150 - 400 K/uL   nRBC 0.0 0.0 - 0.2 %   Neutrophils Relative % 87 %   Neutro Abs 36.5 (H) 1.7 - 7.7 K/uL   Lymphocytes Relative 4 %   Lymphs Abs 1.6 0.7 - 4.0 K/uL   Monocytes Relative 4 %   Monocytes Absolute 1.9 (H) 0.1 - 1.0 K/uL   Eosinophils Relative 0 %   Eosinophils Absolute 0.0 0.0 - 0.5 K/uL    Basophils Relative 0 %   Basophils Absolute 0.0 0.0 - 0.1 K/uL   Immature Granulocytes 5 %   Abs Immature Granulocytes 2.19 (H) 0.00 - 0.07 K/uL    Comment: Performed at [facility], [address], [place], [place] [address]  Procalcitonin     Status: None   Collection Time: 07/10/19  7:16 AM  Result Value Ref Range   Procalcitonin 2.46 ng/mL    Comment:        Interpretation: PCT > 2 ng/mL: Systemic infection (sepsis) is likely, unless other causes are known. (NOTE)       Sepsis PCT Algorithm           Lower Respiratory Tract                                      Infection PCT Algorithm    ----------------------------     ----------------------------         PCT < 0.25 ng/mL                PCT < 0.10 ng/mL         Strongly encourage             Strongly discourage   discontinuation of antibiotics    initiation of antibiotics    ----------------------------     -----------------------------  PCT 0.25 - 0.50 ng/mL            PCT 0.10 - 0.25 ng/mL               OR       >80% decrease in PCT            Discourage initiation of                                            antibiotics      Encourage discontinuation           of antibiotics    ----------------------------     -----------------------------         PCT >= 0.50 ng/mL              PCT 0.26 - 0.50 ng/mL               AND       <80% decrease in PCT              Encourage initiation of                                             antibiotics       Encourage continuation           of antibiotics    ----------------------------     -----------------------------        PCT >= 0.50 ng/mL                  PCT > 0.50 ng/mL               AND         increase in PCT                  Strongly encourage                                      initiation of antibiotics    Strongly encourage escalation           of antibiotics                                     -----------------------------                                            PCT <= 0.25 ng/mL                                                 OR                                        > 80% decrease in PCT  Discontinue / Do not initiate                                             antibiotics Performed at [place] [address], [place], [place] [address]   CMP in am     Status: Abnormal   Collection Time: 07/10/19  7:16 AM  Result Value Ref Range   Sodium 134 (L) 135 - 145 mmol/L   Potassium 3.3 (L) 3.5 - 5.1 mmol/L   Chloride 102 98 - 111 mmol/L   CO2 22 22 - 32 mmol/L   Glucose, Bld 136 (H) 70 - 99 mg/dL   BUN 17 6 - 20 mg/dL   Creatinine, Ser 0.70 0.61 - 1.24 mg/dL   Calcium 7.6 (L) 8.9 - 10.3 mg/dL   Total Protein 5.9 (L) 6.5 - 8.1 g/dL   Albumin 2.2 (L) 3.5 - 5.0 g/dL   AST 122 (H) 15 - 41 U/L   ALT 135 (H) 0 - 44 U/L   Alkaline Phosphatase 79 38 - 126 U/L   Total Bilirubin 0.8 0.3 - 1.2 mg/dL   GFR calc non Af Amer >60 >60 mL/min   GFR calc Af Amer >60 >60 mL/min   Anion gap 10 5 - 15    Comment: Performed at [facility], [place] [address], [place], [place] [address]  C-reactive protein     Status: Abnormal   Collection Time: 07/10/19  7:16 AM  Result Value Ref Range   CRP 28.5 (H) <1.0 mg/dL    Comment: Performed at [facility], [place] [address] [place], [place] [address]  Ferritin     Status: Abnormal   Collection Time: 07/10/19  7:16 AM  Result Value Ref Range   Ferritin 1,910 (H) 24 - 336 ng/mL    Comment: Performed at [facility], [place] [address] [place], [place] [address]  Phosphorus     Status: None   Collection Time: 07/10/19  7:16 AM  Result Value Ref Range   Phosphorus 3.3 2.5 - 4.6 mg/dL    Comment: Performed at [facility], [place] [address] [place], [place] [address]  Angiotensin converting enzyme     Status: None   Collection Time: 07/10/19  7:16 AM  Result Value Ref Range    Angiotensin-Converting Enzyme 26 14 - 82 U/L    Comment: (NOTE) Performed At: [facility] [place], [place] [id number] Rush Farmer MD ON:[id number]   Vancomycin, peak     Status: Abnormal   Collection Time: 07/10/19  7:16 AM  Result Value Ref Range   Vancomycin Pk 14 (L) 30 - 40 ug/mL    Comment: Performed at [facility], [place] [address] [place], [place] [address]  Vancomycin, trough     Status: Abnormal   Collection Time: 07/10/19 11:09 AM  Result Value Ref Range   Vancomycin Tr 8 (L) 15 - 20 ug/mL    Comment: Performed at [facility], [place] [address] [place], [place] [address]  Phosphorus     Status: None   Collection Time: 07/11/19  3:41 AM  Result Value Ref Range   Phosphorus 3.5 2.5 - 4.6 mg/dL    Comment: Performed at [facility], [place] [address], [place], [place] [address]  CMP in am  Status: Abnormal   Collection Time: 07/11/19  3:41 AM  Result Value Ref Range   Sodium 138 135 - 145 mmol/L   Potassium 4.2 3.5 - 5.1 mmol/L    Comment: DELTA CHECK NOTED NO VISIBLE HEMOLYSIS    Chloride 110 98 - 111 mmol/L   CO2 20 (L) 22 - 32 mmol/L   Glucose, Bld 111 (H) 70 - 99 mg/dL   BUN 23 (H) 6 - 20 mg/dL   Creatinine, Ser 1.88 0.61 - 1.24 mg/dL   Calcium 6.8 (L) 8.9 - 10.3 mg/dL   Total Protein 5.2 (L) 6.5 - 8.1 g/dL   Albumin 2.0 (L) 3.5 - 5.0 g/dL   AST 416 (H) 15 - 41 U/L   ALT 203 (H) 0 - 44 U/L   Alkaline Phosphatase 79 38 - 126 U/L   Total Bilirubin 0.8 0.3 - 1.2 mg/dL   GFR calc non Af Amer >60 >60 mL/min   GFR calc Af Amer >60 >60 mL/min   Anion gap 8 5 - 15    Comment: Performed at [facility], [address], [place], [place] [address]  Procalcitonin - Baseline     Status: None   Collection Time: 07/11/19  5:00 AM  Result Value Ref Range   Procalcitonin 1.95 ng/mL    Comment:        Interpretation: PCT > 0.5 ng/mL and <= 2 ng/mL: Systemic infection  (sepsis) is possible, but other conditions are known to elevate PCT as well. (NOTE)       Sepsis PCT Algorithm           Lower Respiratory Tract                                      Infection PCT Algorithm    ----------------------------     ----------------------------         PCT < 0.25 ng/mL                PCT < 0.10 ng/mL         Strongly encourage             Strongly discourage   discontinuation of antibiotics    initiation of antibiotics    ----------------------------     -----------------------------       PCT 0.25 - 0.50 ng/mL            PCT 0.10 - 0.25 ng/mL               OR       >80% decrease in PCT            Discourage initiation of                                            antibiotics      Encourage discontinuation           of antibiotics    ----------------------------     -----------------------------         PCT >= 0.50 ng/mL              PCT 0.26 - 0.50 ng/mL                AND       <80% decrease in PCT  Encourage initiation of                                             antibiotics       Encourage continuation           of antibiotics    ----------------------------     -----------------------------        PCT >= 0.50 ng/mL                  PCT > 0.50 ng/mL               AND         increase in PCT                  Strongly encourage                                      initiation of antibiotics    Strongly encourage escalation           of antibiotics                                     -----------------------------                                           PCT <= 0.25 ng/mL                                                 OR                                        > 80% decrease in PCT                                     Discontinue / Do not initiate                                             antibiotics Performed at [facility], [address] [place], [place] [id number]    No results  found.      Assessment/Plan  Sepsis of uncertain etiology Rule out meningitis. Multiple lung nodules both bases Progressive hypoxia/ARDS development. Skin rash  Plan: We have been asked to obtain a skin biopsy.  This it is we have all the stuff together I will do it.  Procedure:  Skin biopsy right shoulder rash site  Description:  Pt was lying in bed.  We cleaned the site with betadine swabs.  Then we infiltrated the site with 2 cc of 2% xylocaine with epi.   When  he was adequately anesthetized we use a 3 mm skin punch to obtain the  specimen from the site shown above.  He tolerated the procedure well.  We controlled the bleeding with pressure.  Triple ointment was placed over the site, and a dry sterile dressing was applied.  He tolerated the procedure well.  His wife was with him during the procedure.     Sherrie George [facility] Surgery 07/11/2019, 12:05 PM Please see Amion for pager number during day hours 7:00am-4:30pm

## 2019-07-11 NOTE — Progress Notes (Signed)
Subjective:  Feeling better than yesterday in terms of fever   Antibiotics:  Anti-infectives (From admission, onward)   Start     Dose/Rate Route Frequency Ordered Stop   07/10/19 1200  vancomycin (VANCOCIN) 1,500 mg in sodium chloride 0.9 % 500 mL IVPB     1,500 mg 250 mL/hr over 120 Minutes Intravenous Every 8 hours 07/10/19 1156     07/07/19 1600  vancomycin (VANCOCIN) 1,750 mg in sodium chloride 0.9 % 500 mL IVPB  Status:  Discontinued     1,750 mg 250 mL/hr over 120 Minutes Intravenous Every 12 hours 07/07/19 0605 07/07/19 1120   07/07/19 1600  cefTRIAXone (ROCEPHIN) 2 g in sodium chloride 0.9 % 100 mL IVPB     2 g 200 mL/hr over 30 Minutes Intravenous Every 12 hours 07/07/19 1108     07/07/19 1200  vancomycin (VANCOCIN) IVPB 1000 mg/200 mL premix  Status:  Discontinued     1,000 mg 200 mL/hr over 60 Minutes Intravenous Every 8 hours 07/07/19 1120 07/10/19 1153   07/07/19 0445  doxycycline (VIBRAMYCIN) 100 mg in sodium chloride 0.9 % 250 mL IVPB     100 mg 125 mL/hr over 120 Minutes Intravenous Every 12 hours 07/07/19 0440     07/07/19 0400  vancomycin (VANCOCIN) 1,750 mg in sodium chloride 0.9 % 500 mL IVPB  Status:  Discontinued     1,750 mg 250 mL/hr over 120 Minutes Intravenous Every 8 hours 07/06/19 1605 07/06/19 2122   07/07/19 0200  vancomycin (VANCOCIN) 1,500 mg in sodium chloride 0.9 % 500 mL IVPB  Status:  Discontinued     1,500 mg 250 mL/hr over 120 Minutes Intravenous Every 8 hours 07/06/19 2122 07/07/19 0605   07/07/19 0000  ceFEPIme (MAXIPIME) 2 g in sodium chloride 0.9 % 100 mL IVPB  Status:  Discontinued     2 g 200 mL/hr over 30 Minutes Intravenous Every 8 hours 07/06/19 1606 07/07/19 1108   07/06/19 2230  acyclovir (ZOVIRAX) 750 mg in dextrose 5 % 150 mL IVPB  Status:  Discontinued     750 mg 165 mL/hr over 60 Minutes Intravenous Every 8 hours 07/06/19 2217 07/07/19 1108   07/06/19 1700  vancomycin (VANCOCIN) IVPB 1000 mg/200 mL premix     1,000 mg 200 mL/hr over 60 Minutes Intravenous Every 1 hr x 2 07/06/19 1603 07/06/19 1828   07/06/19 1600  ceFEPIme (MAXIPIME) 2 g in sodium chloride 0.9 % 100 mL IVPB     2 g 200 mL/hr over 30 Minutes Intravenous  Once 07/06/19 1547 07/06/19 1644   07/06/19 1600  metroNIDAZOLE (FLAGYL) IVPB 500 mg     500 mg 100 mL/hr over 60 Minutes Intravenous  Once 07/06/19 1547 07/06/19 1936   07/06/19 1600  vancomycin (VANCOCIN) IVPB 1000 mg/200 mL premix  Status:  Discontinued     1,000 mg 200 mL/hr over 60 Minutes Intravenous  Once 07/06/19 1547 07/06/19 1603      Medications: Scheduled Meds: . chlorhexidine  15 mL Mouth Rinse BID  . Chlorhexidine Gluconate Cloth  6 each Topical Daily  . famotidine  20 mg Oral BID  . furosemide  40 mg Intravenous Once  . Gerhardt's butt cream  1 application Topical TID  . heparin injection (subcutaneous)  5,000 Units Subcutaneous Q8H  . lactobacillus  1 g Oral TID WC  . lidocaine-EPINEPHrine  20 mL Infiltration Once  . mouth rinse  15 mL Mouth Rinse q12n4p  . pantoprazole  40 mg Oral BID AC  . predniSONE  60 mg Oral Daily  . sodium chloride flush  10-40 mL Intracatheter Q12H   Continuous Infusions: . sodium chloride Stopped (07/07/19 0438)  . sodium chloride Stopped (07/10/19 0308)  . cefTRIAXone (ROCEPHIN)  IV Stopped (07/11/19 0418)  . doxycycline (VIBRAMYCIN) IV Stopped (07/11/19 0954)  . sodium chloride    . vancomycin 1,500 mg (07/11/19 1201)   PRN Meds:.sodium chloride, sodium chloride, acetaminophen **OR** acetaminophen, hydrALAZINE, lidocaine, loperamide, ondansetron (ZOFRAN) IV, simethicone, sodium chloride flush    Objective: Weight change: -3 kg  Intake/Output Summary (Last 24 hours) at 07/11/2019 1420 Last data filed at 07/11/2019 1035 Gross per 24 hour  Intake 2573.5 ml  Output 2750 ml  Net -176.5 ml   Blood pressure (!) 142/76, pulse 73, temperature 98 F (36.7 C), temperature source Oral, resp. rate (!) 30, height 5\' 11"   (1.803 m), weight 116.3 kg, SpO2 95 %. Temp:  [98 F (36.7 C)-101.5 F (38.6 C)] 98 F (36.7 C) (11/16 1241) Pulse Rate:  [73-125] 73 (11/16 1300) Resp:  [16-55] 30 (11/16 1300) BP: (110-145)/(49-93) 142/76 (11/16 1300) SpO2:  [91 %-100 %] 95 % (11/16 1300) Weight:  [116.3 kg] 116.3 kg (11/16 0350)  Physical Exam: General: Alert and awake, oriented x3, not in any acute distress, wife at bedside HEENT: anicteric sclera, EOM CVS regular rate, normal  Chest: , no wheezing, no respiratory distress Abdomen: soft non-distended,  Extremities: edema in arms and legs Skin: rash see picture Neuro: nonfocal  CBC:  07/08/2019:     07/11/2019:         BMET Recent Labs    07/10/19 0716 07/11/19 0341  NA 134* 138  K 3.3* 4.2  CL 102 110  CO2 22 20*  GLUCOSE 136* 111*  BUN 17 23*  CREATININE 0.70 0.83  CALCIUM 7.6* 6.8*     Liver Panel  Recent Labs    07/10/19 0716 07/11/19 0341  PROT 5.9* 5.2*  ALBUMIN 2.2* 2.0*  AST 122* 166*  ALT 135* 203*  ALKPHOS 79 79  BILITOT 0.8 0.8       Sedimentation Rate No results for input(s): ESRSEDRATE in the last 72 hours. C-Reactive Protein Recent Labs    07/10/19 0716  CRP 28.5*    Micro Results: Recent Results (from the past 720 hour(s))  Culture, blood (Routine x 2)     Status: None   Collection Time: 07/06/19  3:25 PM   Specimen: BLOOD  Result Value Ref Range Status   Specimen Description   Final    BLOOD RIGHT ANTECUBITAL Performed at [facility], [address] [place], [place] [address]    Special Requests   Final    BOTTLES DRAWN AEROBIC AND ANAEROBIC Blood Culture adequate volume Performed at [facility], [address] [place], [place] [address]    Culture   Final    NO GROWTH 5 DAYS Performed at [facility], [place] [address], [place], [place] [address]    Report Status 07/11/2019 FINAL  Final  SARS CORONAVIRUS 2 (TAT 6-24 HRS) Nasopharyngeal Nasopharyngeal Swab      Status: None   Collection Time: 07/06/19  3:39 PM   Specimen: Nasopharyngeal Swab  Result Value Ref Range Status   SARS Coronavirus 2 NEGATIVE NEGATIVE Final    Comment: (NOTE) SARS-CoV-2 target nucleic acids are NOT DETECTED. The SARS-CoV-2 RNA is generally detectable in upper and lower respiratory specimens during the acute phase  of infection. Negative results do not preclude SARS-CoV-2 infection, do not rule out co-infections with other pathogens, and should not be used as the sole basis for treatment or other patient management decisions. Negative results must be combined with clinical observations, patient history, and epidemiological information. The expected result is Negative. Fact Sheet for Patients: HairSlick.no Fact Sheet for Healthcare Providers: quierodirigir.com This test is not yet approved or cleared by the [place] FDA and  has been authorized for detection and/or diagnosis of SARS-CoV-2 by FDA under an Emergency Use Authorization (EUA). This EUA will remain  in effect (meaning this test can be used) for the duration of the COVID-19 declaration under Section 56 4(b)(1) of the Act, 21 U.S.C. section 360bbb-3(b)(1), unless the authorization is terminated or revoked sooner. Performed at [facility], [address] [place], [place] [address]   Culture, blood (Routine x 2)     Status: None   Collection Time: 07/06/19  4:00 PM   Specimen: BLOOD  Result Value Ref Range Status   Specimen Description   Final    BLOOD RIGHT WRIST Performed at [facility], [address] [place], [place] [address]    Special Requests   Final    BOTTLES DRAWN AEROBIC AND ANAEROBIC Blood Culture adequate volume Performed at [facility], [address] [place], [place] [address]    Culture   Final    NO GROWTH 5 DAYS Performed at [facility], [address] [place], [place] [address]     Report Status 07/11/2019 FINAL  Final  Urine culture     Status: None   Collection Time: 07/06/19  4:30 PM   Specimen: Urine, Random  Result Value Ref Range Status   Specimen Description   Final    URINE, RANDOM Performed at [facility], [address] [place], [place] [address]    Special Requests   Final    NONE Performed at [facility], [address] [place], [place] [address]    Culture   Final    NO GROWTH Performed at [facility], [address], [place], [place] [address]    Report Status 07/08/2019 FINAL  Final  Respiratory Panel by PCR     Status: None   Collection Time: 07/06/19  9:42 PM   Specimen: Nasopharyngeal Swab; Respiratory  Result Value Ref Range Status   Adenovirus NOT DETECTED NOT DETECTED Final   Coronavirus 229E NOT DETECTED NOT DETECTED Final    Comment: (NOTE) The Coronavirus on the Respiratory Panel, DOES NOT test for the novel  Coronavirus (2019 nCoV)    Coronavirus HKU1 NOT DETECTED NOT DETECTED Final   Coronavirus NL63 NOT DETECTED NOT DETECTED Final   Coronavirus OC43 NOT DETECTED NOT DETECTED Final   Metapneumovirus NOT DETECTED NOT DETECTED Final   Rhinovirus / Enterovirus NOT DETECTED NOT DETECTED Final   Influenza A NOT DETECTED NOT DETECTED Final   Influenza B NOT DETECTED NOT DETECTED Final   Parainfluenza Virus 1 NOT DETECTED NOT DETECTED Final   Parainfluenza Virus 2 NOT DETECTED NOT DETECTED Final   Parainfluenza Virus 3 NOT DETECTED NOT DETECTED Final   Parainfluenza Virus 4 NOT DETECTED NOT DETECTED Final   Respiratory Syncytial Virus NOT DETECTED NOT DETECTED Final   Bordetella pertussis NOT DETECTED NOT DETECTED Final   Chlamydophila pneumoniae NOT DETECTED NOT DETECTED Final   Mycoplasma pneumoniae NOT DETECTED NOT DETECTED Final  Comment: Performed at [facility], [address] [place], [place] [address]  MRSA PCR Screening     Status: None   Collection Time: 07/07/19  3:32 AM    Specimen: Nasal Mucosa; Nasopharyngeal  Result Value Ref Range Status   MRSA by PCR NEGATIVE NEGATIVE Final    Comment:        The GeneXpert MRSA Assay (FDA approved for NASAL specimens only), is one component of a comprehensive MRSA colonization surveillance program. It is not intended to diagnose MRSA infection nor to guide or monitor treatment for MRSA infections. Performed at [facility], [address] [place], [place] [address]   CSF culture     Status: None   Collection Time: 07/07/19  1:22 PM   Specimen: PATH Cytology CSF; Cerebrospinal Fluid  Result Value Ref Range Status   Specimen Description   Final    CSF Performed at [facility], [address] [place], [place] [address]    Special Requests   Final    NONE Performed at [facility], [address] [place], [place] [address]    Gram Stain   Final    CYTOSPIN SMEAR WBC PRESENT,BOTH PMN AND MONONUCLEAR NO ORGANISMS SEEN Gram Stain Report Called to,Read Back By and Verified With: HEAVNER,A. RN  ON 11.12.2020 BY COHEN,K Performed at [facility], [address] [place], [place] [address]    Culture   Final    NO GROWTH 3 DAYS Performed at [facility], [address] [place], [place] [address]    Report Status 07/11/2019 FINAL  Final  C difficile quick scan w PCR reflex     Status: None   Collection Time: 07/09/19  2:30 PM   Specimen: STOOL  Result Value Ref Range Status   C Diff antigen NEGATIVE NEGATIVE Final   C Diff toxin NEGATIVE NEGATIVE Final   C Diff interpretation No C. difficile detected.  Final    Comment: Performed at [facility], [address] [place], [place] [address]    Studies/Results: No results found.    Assessment/Plan:  INTERVAL HISTORY:pt w worsening fevers, rash over weekend and started on steroids   Principal Problem:   Bacterial meningitis Active Problems:    Sepsis (HCC)   Fever   Headache   Rash   Acute respiratory failure with hypoxemia (HCC)    Raymond Owens is a 28 y.o. male withl meningitis (vs possibly RMSF) improving, rash  #1 "Aseptic meningitis" thought pt received antibiotics prior to LP x day with rash and high fevers, despite broad spectrum antibiotics. He has lung nodules as well. He is now seemingly improving on steroids. ? Sarcoid or AI pathology driving thiis  --continue current abx --"" steroids --biopsy of skin --MRI brain   LOS: 4 days   Raymond Owens 07/11/2019, 2:20 PM

## 2019-07-12 ENCOUNTER — Encounter (HOSPITAL_COMMUNITY): Payer: Self-pay

## 2019-07-12 DIAGNOSIS — G4700 Insomnia, unspecified: Secondary | ICD-10-CM

## 2019-07-12 LAB — COMPREHENSIVE METABOLIC PANEL
ALT: 228 U/L — ABNORMAL HIGH (ref 0–44)
AST: 72 U/L — ABNORMAL HIGH (ref 15–41)
Albumin: 2.5 g/dL — ABNORMAL LOW (ref 3.5–5.0)
Alkaline Phosphatase: 64 U/L (ref 38–126)
Anion gap: 10 (ref 5–15)
BUN: 22 mg/dL — ABNORMAL HIGH (ref 6–20)
CO2: 22 mmol/L (ref 22–32)
Calcium: 7.9 mg/dL — ABNORMAL LOW (ref 8.9–10.3)
Chloride: 105 mmol/L (ref 98–111)
Creatinine, Ser: 0.83 mg/dL (ref 0.61–1.24)
GFR calc Af Amer: >60 mL/min (ref >60)
GFR calc non Af Amer: >60 mL/min (ref >60)
Glucose, Bld: 142 mg/dL — ABNORMAL HIGH (ref 70–99)
Potassium: 3.4 mmol/L — ABNORMAL LOW (ref 3.5–5.1)
Sodium: 137 mmol/L (ref 135–145)
Total Bilirubin: 0.5 mg/dL (ref 0.3–1.2)
Total Protein: 6.3 g/dL — ABNORMAL LOW (ref 6.5–8.1)

## 2019-07-12 LAB — DIFFERENTIAL
Abs Immature Granulocytes: 1.47 10*3/uL — ABNORMAL HIGH (ref 0.00–0.07)
Basophils Absolute: 0.2 10*3/uL — ABNORMAL HIGH (ref 0.0–0.1)
Basophils Relative: 1 %
Eosinophils Absolute: 0.1 10*3/uL (ref 0.0–0.5)
Eosinophils Relative: 0 %
Immature Granulocytes: 6 %
Lymphocytes Relative: 10 %
Lymphs Abs: 2.6 10*3/uL (ref 0.7–4.0)
Monocytes Absolute: 1.4 10*3/uL — ABNORMAL HIGH (ref 0.1–1.0)
Monocytes Relative: 5 %
Neutro Abs: 20.6 10*3/uL — ABNORMAL HIGH (ref 1.7–7.7)
Neutrophils Relative %: 78 %

## 2019-07-12 LAB — CBC
HCT: 39.4 % (ref 39.0–52.0)
Hemoglobin: 12.8 g/dL — ABNORMAL LOW (ref 13.0–17.0)
MCH: 30 pg (ref 26.0–34.0)
MCHC: 32.5 g/dL (ref 30.0–36.0)
MCV: 92.3 fL (ref 80.0–100.0)
Platelets: 381 10*3/uL (ref 150–400)
RBC: 4.27 MIL/uL (ref 4.22–5.81)
RDW: 14.5 % (ref 11.5–15.5)
WBC: 26.4 10*3/uL — ABNORMAL HIGH (ref 4.0–10.5)
nRBC: 0 % (ref 0.0–0.2)

## 2019-07-12 LAB — C-REACTIVE PROTEIN: CRP: 8.2 mg/dL — ABNORMAL HIGH (ref <1.0)

## 2019-07-12 LAB — PROCALCITONIN: Procalcitonin: 0.54 ng/mL

## 2019-07-12 LAB — HISTOPLASMA ANTIGEN, URINE: Histoplasma Antigen, urine: <0.5 (ref <0.5)

## 2019-07-12 LAB — VANCOMYCIN, PEAK: Vancomycin Pk: 23 ug/mL — ABNORMAL LOW (ref 30–40)

## 2019-07-12 LAB — PHOSPHORUS: Phosphorus: 2.8 mg/dL (ref 2.5–4.6)

## 2019-07-12 MED ORDER — POTASSIUM CHLORIDE CRYS ER 20 MEQ PO TBCR
40.0000 meq | EXTENDED_RELEASE_TABLET | Freq: Once | ORAL | Status: AC
  Administered 2019-07-12: 40 meq via ORAL
  Filled 2019-07-12: qty 2

## 2019-07-12 MED ORDER — LORAZEPAM 0.5 MG PO TABS
0.5000 mg | ORAL_TABLET | Freq: Every day | ORAL | Status: AC
  Administered 2019-07-12: 0.5 mg via ORAL
  Filled 2019-07-12: qty 1

## 2019-07-12 NOTE — Progress Notes (Signed)
PROGRESS NOTE    Raymond Owens  ZOX:[id number] DOB: 1991-03-05 DOA: 07/06/2019 PCP: Jomarie Longs, PA-C   Brief Narrative:  Patient is a 28 year old male without significant past medical history who presented with complaints of headache, fever, rash on the extremities.  Covid-19 found to be negative.  He was admitted with diagnosis of sepsis of unclear origin.  Suspected to have bacterial meningitis versus document yesterday fever.  Started on broad-spectrum antibiotics with vancomycin, Rocephin, doxycycline.  Chest imaging showed atypical pneumonia, no evidence of PE.  Overall status improving.  Skin biopsy report pending.  ID following.    Assessment & Plan:   Principal Problem:   Bacterial meningitis Active Problems:   Sepsis (HCC)   Fever   Headache   Rash   Acute respiratory failure with hypoxemia (HCC)   Abnormal liver function   Aseptic meningitis   Leukocytosis   Sepsis: Presented with tachypnea, tachycardia, leukocytosis.  Started on broad-spectrum antibiotics.  Suspected  meningitis.  Lumbar puncture with more than 400 WBC, PMN predominance.  Presented with photophobia, headache, typical findings of meningitis.  All  symptoms have improved.  [facility] spotted fever a possibility though IgG,IgM is negative.  Continue IV doxycycline.  He had rash on his palms, upper extremities.  Leukocytosis improving. Autoimmune disease/ sarcoidosis also on the differential given his skin, pulmonary finding.Normal  ACE level in CSF and serum.  Histoplasma antigen negative. Started on steroids.  Has elevated ferritin, CRP, D-dimer. Underwent a skin biopsy by general surgery for possible sarcoidosis versus vasculitis.  Follow-up biopsy report. MRI of the brain did not show any acute intracranial normalities.  Acute hypoxic respiratory failure: CT showed multifocal pneumonia.  Currently on 3 L of oxygen per minute.  Not in respiratory distress.  Respiratory status improving.  Also given  Lasix for pulmonary edema.  Hypokalemia/hypophosphatemia: Supplemented  D-dimer: Most likely due to inflammatory status.  CT chest negative.  Transaminitis: Due to sepsis.  Continue to monitor.  Hepatitis panel negative  Diarrhea: Improved.  C. difficile negative            DVT prophylaxis: heparin [place] Code Status: Full Family Communication: None Disposition Plan: Home after clinical stability   Consultants: ID  Procedures:Skin biopsy,LP  Antimicrobials:  Anti-infectives (From admission, onward)   Start     Dose/Rate Route Frequency Ordered Stop   07/10/19 1200  vancomycin (VANCOCIN) 1,500 mg in sodium chloride 0.9 % 500 mL IVPB     1,500 mg 250 mL/hr over 120 Minutes Intravenous Every 8 hours 07/10/19 1156     07/07/19 1600  vancomycin (VANCOCIN) 1,750 mg in sodium chloride 0.9 % 500 mL IVPB  Status:  Discontinued     1,750 mg 250 mL/hr over 120 Minutes Intravenous Every 12 hours 07/07/19 0605 07/07/19 1120   07/07/19 1600  cefTRIAXone (ROCEPHIN) 2 g in sodium chloride 0.9 % 100 mL IVPB     2 g 200 mL/hr over 30 Minutes Intravenous Every 12 hours 07/07/19 1108     07/07/19 1200  vancomycin (VANCOCIN) IVPB 1000 mg/200 mL premix  Status:  Discontinued     1,000 mg 200 mL/hr over 60 Minutes Intravenous Every 8 hours 07/07/19 1120 07/10/19 1153   07/07/19 0445  doxycycline (VIBRAMYCIN) 100 mg in sodium chloride 0.9 % 250 mL IVPB     100 mg 125 mL/hr over 120 Minutes Intravenous Every 12 hours 07/07/19 0440     07/07/19 0400  vancomycin (VANCOCIN) 1,750 mg in sodium chloride 0.9 % 500  mL IVPB  Status:  Discontinued     1,750 mg 250 mL/hr over 120 Minutes Intravenous Every 8 hours 07/06/19 1605 07/06/19 2122   07/07/19 0200  vancomycin (VANCOCIN) 1,500 mg in sodium chloride 0.9 % 500 mL IVPB  Status:  Discontinued     1,500 mg 250 mL/hr over 120 Minutes Intravenous Every 8 hours 07/06/19 2122 07/07/19 0605   07/07/19 0000  ceFEPIme (MAXIPIME) 2 g in sodium chloride 0.9 %  100 mL IVPB  Status:  Discontinued     2 g 200 mL/hr over 30 Minutes Intravenous Every 8 hours 07/06/19 1606 07/07/19 1108   07/06/19 2230  acyclovir (ZOVIRAX) 750 mg in dextrose 5 % 150 mL IVPB  Status:  Discontinued     750 mg 165 mL/hr over 60 Minutes Intravenous Every 8 hours 07/06/19 2217 07/07/19 1108   07/06/19 1700  vancomycin (VANCOCIN) IVPB 1000 mg/200 mL premix     1,000 mg 200 mL/hr over 60 Minutes Intravenous Every 1 hr x 2 07/06/19 1603 07/06/19 1828   07/06/19 1600  ceFEPIme (MAXIPIME) 2 g in sodium chloride 0.9 % 100 mL IVPB     2 g 200 mL/hr over 30 Minutes Intravenous  Once 07/06/19 1547 07/06/19 1644   07/06/19 1600  metroNIDAZOLE (FLAGYL) IVPB 500 mg     500 mg 100 mL/hr over 60 Minutes Intravenous  Once 07/06/19 1547 07/06/19 1936   07/06/19 1600  vancomycin (VANCOCIN) IVPB 1000 mg/200 mL premix  Status:  Discontinued     1,000 mg 200 mL/hr over 60 Minutes Intravenous  Once 07/06/19 1547 07/06/19 1603      Subjective:  Patient seen and examined the bedside this morning.  Hemodynamically stable.  Feels better.  Currently on 3 L of oxygen per minute.  Respiratory status stable.  Sitting on the chair  Objective: Vitals:   07/12/19 0411 07/12/19 0600 07/12/19 0741 07/12/19 0800  BP:  (!) 146/97  (!) 155/97  Pulse:  70  98  Resp:  (!) 29  (!) 24  Temp:   98.4 F (36.9 C)   TempSrc:   Oral   SpO2:  98%  95%  Weight: 117.6 kg     Height:        Intake/Output Summary (Last 24 hours) at 07/12/2019 0934 Last data filed at 07/12/2019 0900 Gross per 24 hour  Intake 2541.84 ml  Output 3050 ml  Net -508.16 ml   Filed Weights   07/10/19 0600 07/11/19 0350 07/12/19 0411  Weight: 118.2 kg 116.3 kg 117.6 kg    Examination:  General exam: Appears calm and comfortable ,Not in distress,average built HEENT:PERRL,Oral mucosa moist, Ear/Nose normal on gross exam Respiratory system: Bilateral diminished breath sounds on the bases, no wheezes or crackles   Cardiovascular system: S1 & S2 heard, RRR. No JVD, murmurs, rubs, gallops or clicks. Trace pedal edema. Gastrointestinal system: Abdomen is nondistended, soft and nontender. No organomegaly or masses felt. Normal bowel sounds heard. Central nervous system: Alert and oriented. No focal neurological deficits. Extremities: No edema, no clubbing ,no cyanosis, distal peripheral pulses palpable. Skin: No rashes, lesions or ulcers,no icterus ,no pallor MSK: Normal muscle bulk,tone ,power Psychiatry: Judgement and insight appear normal. Mood & affect appropriate.     Data Reviewed: I have personally reviewed following labs and imaging studies  CBC: Recent Labs  Lab 07/06/19 1524 07/07/19 0546 07/08/19 0258 07/09/19 0156 07/10/19 0716 07/11/19 1122  WBC 28.7* 26.7* 26.2* 30.6* 42.2* 34.8*  NEUTROABS 25.2*  --  22.1* 26.2* 36.5*  --   HGB 14.4 13.1 12.1* 12.9* 12.1* 12.6*  HCT 42.4 39.0 35.7* 38.4* 36.2* 37.8*  MCV 87.1 89.4 88.4 89.5 90.5 90.6  PLT 234 240 240 288 315 305   Basic Metabolic Panel: Recent Labs  Lab 07/06/19 1526  07/08/19 0258 07/09/19 0156 07/09/19 1437 07/10/19 0716 07/11/19 0341  NA  --    < > 136 133* 135 134* 138  K  --    < > 3.2* 3.0* 3.1* 3.3* 4.2  CL  --    < > 104 102 104 102 110  CO2  --    < > 22 22 23 22  20*  GLUCOSE  --    < > 100* 138* 124* 136* 111*  BUN  --    < > 15 14 16 17  23*  CREATININE  --    < > 0.74 0.89 0.89 0.70 0.83  CALCIUM  --    < > 8.0* 7.7* 7.9* 7.6* 6.8*  MG 1.7  --  1.9  --  2.1  --   --   PHOS  --   --  1.8* 1.4*  --  3.3 3.5   < > = values in this interval not displayed.   GFR: Estimated Creatinine Clearance: 174.3 mL/min (by C-G formula based on SCr of 0.83 mg/dL). Liver Function Tests: Recent Labs  Lab 07/06/19 1524 07/07/19 0546 07/08/19 0258 07/09/19 0156 07/10/19 0716 07/11/19 0341  AST 45* 27  --   --  122* 166*  ALT 54* 46*  --   --  135* 203*  ALKPHOS 75 88  --   --  79 79  BILITOT 0.9 1.3*  --   --   0.8 0.8  PROT 7.8 6.8  --   --  5.9* 5.2*  ALBUMIN 3.5 3.1* 2.4* 2.4* 2.2* 2.0*   No results for input(s): LIPASE, AMYLASE in the last 168 hours. No results for input(s): AMMONIA in the last 168 hours. Coagulation Profile: Recent Labs  Lab 07/06/19 1524  INR 1.4*   Cardiac Enzymes: Recent Labs  Lab 07/07/19 0546  CKTOTAL 89  CKMB 2.7   BNP (last 3 results) No results for input(s): PROBNP in the last 8760 hours. HbA1C: No results for input(s): HGBA1C in the last 72 hours. CBG: Recent Labs  Lab 07/07/19 1656  GLUCAP 89   Lipid Profile: No results for input(s): CHOL, HDL, LDLCALC, TRIG, CHOLHDL, LDLDIRECT in the last 72 hours. Thyroid Function Tests: No results for input(s): TSH, T4TOTAL, FREET4, T3FREE, THYROIDAB in the last 72 hours. Anemia Panel: Recent Labs    07/10/19 0716  FERRITIN 1,910*   Sepsis Labs: Recent Labs  Lab 07/06/19 1525 07/06/19 1735  07/06/19 2132 07/07/19 1340 07/08/19 1155 07/09/19 0156 07/10/19 0716 07/11/19 0500  PROCALCITON  --   --    < > 3.33  --  3.40 3.50 2.46 1.95  LATICACIDVEN 3.3* 2.3*  --  2.2* 1.7  --   --   --   --    < > = values in this interval not displayed.    Recent Results (from the past 240 hour(s))  Culture, blood (Routine x 2)     Status: None   Collection Time: 07/06/19  3:25 PM   Specimen: BLOOD  Result Value Ref Range Status   Specimen Description   Final    BLOOD RIGHT ANTECUBITAL Performed at [facility] [place], [address] [place], [place] [address]  Special Requests   Final    BOTTLES DRAWN AEROBIC AND ANAEROBIC Blood Culture adequate volume Performed at [facility], [address] [place], [place] [address]    Culture   Final    NO GROWTH 5 DAYS Performed at [facility], [address], [place], [place] [address]    Report Status 07/11/2019 FINAL  Final  SARS CORONAVIRUS 2 (TAT 6-24 HRS) Nasopharyngeal Nasopharyngeal Swab     Status: None   Collection Time:  07/06/19  3:39 PM   Specimen: Nasopharyngeal Swab  Result Value Ref Range Status   SARS Coronavirus 2 NEGATIVE NEGATIVE Final    Comment: (NOTE) SARS-CoV-2 target nucleic acids are NOT DETECTED. The SARS-CoV-2 RNA is generally detectable in upper and lower respiratory specimens during the acute phase of infection. Negative results do not preclude SARS-CoV-2 infection, do not rule out co-infections with other pathogens, and should not be used as the sole basis for treatment or other patient management decisions. Negative results must be combined with clinical observations, patient history, and epidemiological information. The expected result is Negative. Fact Sheet for Patients: HairSlick.no Fact Sheet for Healthcare Providers: quierodirigir.com This test is not yet approved or cleared by the [place] FDA and  has been authorized for detection and/or diagnosis of SARS-CoV-2 by FDA under an Emergency Use Authorization (EUA). This EUA will remain  in effect (meaning this test can be used) for the duration of the COVID-19 declaration under Section 56 4(b)(1) of the Act, 21 U.S.C. section 360bbb-3(b)(1), unless the authorization is terminated or revoked sooner. Performed at [facility], [address] [place], [place] [address]   Culture, blood (Routine x 2)     Status: None   Collection Time: 07/06/19  4:00 PM   Specimen: BLOOD  Result Value Ref Range Status   Specimen Description   Final    BLOOD RIGHT WRIST Performed at [facility], [address] [place], [place] [address]    Special Requests   Final    BOTTLES DRAWN AEROBIC AND ANAEROBIC Blood Culture adequate volume Performed at [facility], [address] [place], [place] [address]    Culture   Final    NO GROWTH 5 DAYS Performed at [facility], [address], [place], [place] [address]    Report Status 07/11/2019 FINAL   Final  Urine culture     Status: None   Collection Time: 07/06/19  4:30 PM   Specimen: Urine, Random  Result Value Ref Range Status   Specimen Description   Final    URINE, RANDOM Performed at [facility], [address] [place], [place] [address]    Special Requests   Final    NONE Performed at [facility], [address] [place], [place] [address]    Culture   Final    NO GROWTH Performed at [facility], [address] [place], [place] [address]    Report Status 07/08/2019 FINAL  Final  Respiratory Panel by PCR     Status: None   Collection Time: 07/06/19  9:42 PM   Specimen: Nasopharyngeal Swab; Respiratory  Result Value Ref Range Status   Adenovirus NOT DETECTED NOT DETECTED Final   Coronavirus 229E NOT DETECTED NOT DETECTED Final    Comment: (NOTE) The Coronavirus on the Respiratory Panel, DOES NOT test for the novel  Coronavirus (2019 nCoV)  Coronavirus HKU1 NOT DETECTED NOT DETECTED Final   Coronavirus NL63 NOT DETECTED NOT DETECTED Final   Coronavirus OC43 NOT DETECTED NOT DETECTED Final   Metapneumovirus NOT DETECTED NOT DETECTED Final   Rhinovirus / Enterovirus NOT DETECTED NOT DETECTED Final   Influenza A NOT DETECTED NOT DETECTED Final   Influenza B NOT DETECTED NOT DETECTED Final   Parainfluenza Virus 1 NOT DETECTED NOT DETECTED Final   Parainfluenza Virus 2 NOT DETECTED NOT DETECTED Final   Parainfluenza Virus 3 NOT DETECTED NOT DETECTED Final   Parainfluenza Virus 4 NOT DETECTED NOT DETECTED Final   Respiratory Syncytial Virus NOT DETECTED NOT DETECTED Final   Bordetella pertussis NOT DETECTED NOT DETECTED Final   Chlamydophila pneumoniae NOT DETECTED NOT DETECTED Final   Mycoplasma pneumoniae NOT DETECTED NOT DETECTED Final    Comment: Performed at [facility], [place] [address] [place], [place] [address]  MRSA PCR Screening     Status: None   Collection Time: 07/07/19  3:32 AM   Specimen: Nasal Mucosa;  Nasopharyngeal  Result Value Ref Range Status   MRSA by PCR NEGATIVE NEGATIVE Final    Comment:        The GeneXpert MRSA Assay (FDA approved for NASAL specimens only), is one component of a comprehensive MRSA colonization surveillance program. It is not intended to diagnose MRSA infection nor to guide or monitor treatment for MRSA infections. Performed at [facility], [place] [address], [place], [place] [address]   CSF culture     Status: None   Collection Time: 07/07/19  1:22 PM   Specimen: PATH Cytology CSF; Cerebrospinal Fluid  Result Value Ref Range Status   Specimen Description   Final    CSF Performed at [place] [address] [place], [place] [address]    Special Requests   Final    NONE Performed at [facility], [place] [address] [place], [place] [address]    Gram Stain   Final    CYTOSPIN SMEAR WBC PRESENT,BOTH PMN AND MONONUCLEAR NO ORGANISMS SEEN Gram Stain Report Called to,Read Back By and Verified With: HEAVNER,A. RN @1555  ON 11.12.2020 BY COHEN,K Performed at [facility], [place] [address], [place], [place] [address]    Culture   Final    NO GROWTH 3 DAYS Performed at [facility], [place] [address] [place], [place] [address]    Report Status 07/11/2019 FINAL  Final  C difficile quick scan w PCR reflex     Status: None   Collection Time: 07/09/19  2:30 PM   Specimen: STOOL  Result Value Ref Range Status   C Diff antigen NEGATIVE NEGATIVE Final   C Diff toxin NEGATIVE NEGATIVE Final   C Diff interpretation No C. difficile detected.  Final    Comment: Performed at [facility], [place] [address] [place],  [address]         Radiology Studies: Mr Jeri Cos HF Contrast  Result Date: 07/11/2019 CLINICAL DATA:  Confusion. Meningitis. Fever. Patient as bilateral ground-glass nodules. Rash. EXAM: MRI HEAD WITHOUT AND WITH CONTRAST TECHNIQUE:  Multiplanar, multiecho pulse sequences of the brain and surrounding structures were obtained without and with intravenous contrast. CONTRAST:  45mL GADAVIST GADOBUTROL 1 MMOL/ML IV SOLN COMPARISON:  CT head without contrast 07/06/2019 FINDINGS: Brain: No acute infarct, hemorrhage, or mass lesion is present. No significant white matter lesions are present. The ventricles are of normal size. No significant extraaxial fluid  collection is present. The internal auditory canals are within normal limits. The brainstem and cerebellum are within normal limits. Postcontrast images demonstrate no pathologic enhancement. There is no dural thickening or discrete fluid collection. Vascular: Flow is present in the major intracranial arteries. Skull and upper cervical spine: The craniocervical junction is normal. Upper cervical spine is within normal limits. Marrow signal is unremarkable. Sinuses/Orbits: Mild mucosal thickening is present posteriorly in the maxillary sinuses bilaterally. This may represent a polyp or mucous retention cyst. No fluid levels are present. Mastoid air cells are clear. Globes and orbits are within normal limits. IMPRESSION: 1. Normal MRI appearance of the brain. No acute or focal lesion to explain the patient's symptoms. 2. Minimal maxillary sinus disease. Electronically Signed   By: Marin Roberts M.D.   On: 07/11/2019 14:32        Scheduled Meds: . chlorhexidine  15 mL Mouth Rinse BID  . Chlorhexidine Gluconate Cloth  6 each Topical Daily  . famotidine  20 mg Oral BID  . furosemide  40 mg Intravenous Once  . Gerhardt's butt cream  1 application Topical TID  . heparin injection (subcutaneous)  5,000 Units Subcutaneous Q8H  . lactobacillus  1 g Oral TID WC  . mouth rinse  15 mL Mouth Rinse q12n4p  . pantoprazole  40 mg Oral BID AC  . predniSONE  60 mg Oral Daily  . sodium chloride flush  10-40 mL Intracatheter Q12H   Continuous Infusions: . sodium chloride Stopped (07/07/19  0438)  . sodium chloride Stopped (07/10/19 0308)  . cefTRIAXone (ROCEPHIN)  IV Stopped (07/12/19 0407)  . doxycycline (VIBRAMYCIN) IV Stopped (07/12/19 0810)  . sodium chloride    . vancomycin Stopped (07/12/19 0610)     LOS: 5 days    Time spent: 25 mins.More than 50% of that time was spent in counseling and/or coordination of care.      Burnadette Pop, MD [facility] Pager [phone]  If 7PM-7AM, please contact night-coverage [email or web address] Password [facility] 07/12/2019, 9:34 AM

## 2019-07-12 NOTE — Evaluation (Signed)
Physical Therapy Evaluation Patient Details Name: Raymond Owens MRN: [id number] DOB: May 13, 1991 Today's Date: 07/12/2019   History of Present Illness  Pt is 28 yo male admitted with sepsis of unknown cause - suspected bacterial menigitis (per notes this is only suspected as it is responding to steriods).  Pt with bil LE U/S that was negative for DVT; MRI Head: Normal brain MRI; CT Chest: negative for PE but did have pleural effusions.  Clinical Impression   Pt admitted with above diagnosis. He was able to ambulate with min guard but at slow speed, with RW, and on O2.  He required one rest break.  Pt is normally active, works, and has no difficulties with mobility.  Will benefit from PT to return to baseline. Pt currently with functional limitations due to the deficits listed below (see PT Problem List). Pt will benefit from skilled PT to increase their independence and safety with mobility to allow discharge to the venue listed below.       Follow Up Recommendations No PT follow up    Equipment Recommendations  Rolling walker with 5" wheels(Possibly)    Recommendations for Other Services       Precautions / Restrictions Precautions Precautions: Fall      Mobility  Bed Mobility Overal bed mobility: Needs Assistance             General bed mobility comments: in chair at arrival  Transfers Overall transfer level: Needs assistance Equipment used: Rolling walker (2 wheeled) Transfers: Sit to/from Stand Sit to Stand: Supervision            Ambulation/Gait Ambulation/Gait assistance: Min guard Gait Distance (Feet): 200 Feet Assistive device: Rolling walker (2 wheeled) Gait Pattern/deviations: Decreased stride length Gait velocity: decreased   General Gait Details: 1 standing rest break; on 1 LPM O2 with sats 95%; pt did fatigue easily compared to his baseline  Science writer    Modified Rankin (Stroke Patients Only)       Balance  Overall balance assessment: Needs assistance Sitting-balance support: No upper extremity supported;Feet supported Sitting balance-Leahy Scale: Normal     Standing balance support: Bilateral upper extremity supported Standing balance-Leahy Scale: Good                               Pertinent Vitals/Pain Pain Assessment: No/denies pain    Home Living Family/patient expects to be discharged to:: Private residence Living Arrangements: Spouse/significant other(52 month old twins and a 27 yo) Available Help at Discharge: Family Type of Home: House Home Access: Stairs to enter Entrance Stairs-Rails: None Technical brewer of Steps: 2 Home Layout: One level Home Equipment: None      Prior Function Level of Independence: Independent         Comments: worked as Dance movement psychotherapist        Extremity/Trunk Assessment   Upper Extremity Assessment Upper Extremity Assessment: Overall WFL for tasks assessed    Lower Extremity Assessment Lower Extremity Assessment: Overall WFL for tasks assessed    Cervical / Trunk Assessment Cervical / Trunk Assessment: Normal  Communication      Cognition Arousal/Alertness: Awake/alert Behavior During Therapy: WFL for tasks assessed/performed Overall Cognitive Status: Within Functional Limits for tasks assessed  General Comments  Pt already reporting significant improvements since yesterday.    Exercises     Assessment/Plan    PT Assessment Patient needs continued PT services  PT Problem List Decreased mobility;Decreased strength;Decreased balance;Decreased knowledge of use of DME;Cardiopulmonary status limiting activity;Decreased activity tolerance       PT Treatment Interventions DME instruction;Therapeutic exercise;Gait training;Stair training;Functional mobility training;Therapeutic activities;Patient/family education    PT Goals (Current goals can  be found in the Care Plan section)  Acute Rehab PT Goals Patient Stated Goal: home ASAP PT Goal Formulation: With patient/family Time For Goal Achievement: 07/26/19 Potential to Achieve Goals: Good    Frequency Min 3X/week   Barriers to discharge        Co-evaluation               AM-PAC PT "6 Clicks" Mobility  Outcome Measure Help needed turning from your back to your side while in a flat bed without using bedrails?: A Little Help needed moving from lying on your back to sitting on the side of a flat bed without using bedrails?: A Little Help needed moving to and from a bed to a chair (including a wheelchair)?: None Help needed standing up from a chair using your arms (e.g., wheelchair or bedside chair)?: None Help needed to walk in hospital room?: None Help needed climbing 3-5 steps with a railing? : A Little 6 Click Score: 21    End of Session Equipment Utilized During Treatment: Gait belt Activity Tolerance: Patient limited by fatigue Patient left: in chair;with family/visitor present;with call bell/phone within reach Nurse Communication: Mobility status PT Visit Diagnosis: Other abnormalities of gait and mobility (R26.89)    Time: 0175-1025 PT Time Calculation (min) (ACUTE ONLY): 26 min   Charges:   PT Evaluation $PT Eval Moderate Complexity: 1 Mod          Royetta Asal, PT Acute Rehab Services Pager [phone] [facility] [phone] [facility] [phone]   Rayetta Humphrey 07/12/2019, 3:19 PM

## 2019-07-12 NOTE — Progress Notes (Signed)
Subjective:  Insomnia last night   Antibiotics:  Anti-infectives (From admission, onward)   Start     Dose/Rate Route Frequency Ordered Stop   07/10/19 1200  vancomycin (VANCOCIN) 1,500 mg in sodium chloride 0.9 % 500 mL IVPB  Status:  Discontinued     1,500 mg 250 mL/hr over 120 Minutes Intravenous Every 8 hours 07/10/19 1156 07/12/19 1052   07/07/19 1600  vancomycin (VANCOCIN) 1,750 mg in sodium chloride 0.9 % 500 mL IVPB  Status:  Discontinued     1,750 mg 250 mL/hr over 120 Minutes Intravenous Every 12 hours 07/07/19 0605 07/07/19 1120   07/07/19 1600  cefTRIAXone (ROCEPHIN) 2 g in sodium chloride 0.9 % 100 mL IVPB     2 g 200 mL/hr over 30 Minutes Intravenous Every 12 hours 07/07/19 1108     07/07/19 1200  vancomycin (VANCOCIN) IVPB 1000 mg/200 mL premix  Status:  Discontinued     1,000 mg 200 mL/hr over 60 Minutes Intravenous Every 8 hours 07/07/19 1120 07/10/19 1153   07/07/19 0445  doxycycline (VIBRAMYCIN) 100 mg in sodium chloride 0.9 % 250 mL IVPB     100 mg 125 mL/hr over 120 Minutes Intravenous Every 12 hours 07/07/19 0440     07/07/19 0400  vancomycin (VANCOCIN) 1,750 mg in sodium chloride 0.9 % 500 mL IVPB  Status:  Discontinued     1,750 mg 250 mL/hr over 120 Minutes Intravenous Every 8 hours 07/06/19 1605 07/06/19 2122   07/07/19 0200  vancomycin (VANCOCIN) 1,500 mg in sodium chloride 0.9 % 500 mL IVPB  Status:  Discontinued     1,500 mg 250 mL/hr over 120 Minutes Intravenous Every 8 hours 07/06/19 2122 07/07/19 0605   07/07/19 0000  ceFEPIme (MAXIPIME) 2 g in sodium chloride 0.9 % 100 mL IVPB  Status:  Discontinued     2 g 200 mL/hr over 30 Minutes Intravenous Every 8 hours 07/06/19 1606 07/07/19 1108   07/06/19 2230  acyclovir (ZOVIRAX) 750 mg in dextrose 5 % 150 mL IVPB  Status:  Discontinued     750 mg 165 mL/hr over 60 Minutes Intravenous Every 8 hours 07/06/19 2217 07/07/19 1108   07/06/19 1700  vancomycin (VANCOCIN) IVPB 1000 mg/200 mL premix      1,000 mg 200 mL/hr over 60 Minutes Intravenous Every 1 hr x 2 07/06/19 1603 07/06/19 1828   07/06/19 1600  ceFEPIme (MAXIPIME) 2 g in sodium chloride 0.9 % 100 mL IVPB     2 g 200 mL/hr over 30 Minutes Intravenous  Once 07/06/19 1547 07/06/19 1644   07/06/19 1600  metroNIDAZOLE (FLAGYL) IVPB 500 mg     500 mg 100 mL/hr over 60 Minutes Intravenous  Once 07/06/19 1547 07/06/19 1936   07/06/19 1600  vancomycin (VANCOCIN) IVPB 1000 mg/200 mL premix  Status:  Discontinued     1,000 mg 200 mL/hr over 60 Minutes Intravenous  Once 07/06/19 1547 07/06/19 1603      Medications: Scheduled Meds: . chlorhexidine  15 mL Mouth Rinse BID  . Chlorhexidine Gluconate Cloth  6 each Topical Daily  . famotidine  20 mg Oral BID  . furosemide  40 mg Intravenous Once  . Gerhardt's butt cream  1 application Topical TID  . heparin injection (subcutaneous)  5,000 Units Subcutaneous Q8H  . mouth rinse  15 mL Mouth Rinse q12n4p  . potassium chloride  40 mEq Oral Once  . predniSONE  60 mg Oral Daily  . sodium  chloride flush  10-40 mL Intracatheter Q12H   Continuous Infusions: . sodium chloride Stopped (07/07/19 0438)  . sodium chloride Stopped (07/10/19 0308)  . cefTRIAXone (ROCEPHIN)  IV Stopped (07/12/19 0407)  . doxycycline (VIBRAMYCIN) IV Stopped (07/12/19 0810)  . sodium chloride     PRN Meds:.sodium chloride, sodium chloride, acetaminophen **OR** acetaminophen, hydrALAZINE, lidocaine, loperamide, ondansetron (ZOFRAN) IV, simethicone, sodium chloride flush    Objective: Weight change: 1.3 kg  Intake/Output Summary (Last 24 hours) at 07/12/2019 1146 Last data filed at 07/12/2019 0900 Gross per 24 hour  Intake 2541.84 ml  Output 2450 ml  Net 91.84 ml   Blood pressure (!) 148/89, pulse 87, temperature 98.4 F (36.9 C), temperature source Oral, resp. rate (!) 29, height 5\' 11"  (1.803 m), weight 117.6 kg, SpO2 95 %. Temp:  [97.9 F (36.6 C)-98.7 F (37.1 C)] 98.4 F (36.9 C) (11/17 0741)  Pulse Rate:  [69-98] 87 (11/17 1000) Resp:  [18-38] 29 (11/17 1000) BP: (128-156)/(71-101) 148/89 (11/17 1000) SpO2:  [92 %-98 %] 95 % (11/17 1000) Weight:  [117.6 kg] 117.6 kg (11/17 0411)  Physical Exam: General: Alert and awake, oriented x3, not in any acute distress, wife at bedside HEENT: anicteric sclera, EOM CVS regular rate, normal  Chest: , no wheezing, no respiratory distress Abdomen: soft non-distended,  Extremities: edema in arms and legs Skin: rash see picture Neuro: nonfocal  CBC:  07/08/2019:     07/11/2019:       Rash is resolving   BMET Recent Labs    07/11/19 0341 07/12/19 0916  NA 138 137  K 4.2 3.4*  CL 110 105  CO2 20* 22  GLUCOSE 111* 142*  BUN 23* 22*  CREATININE 0.83 0.83  CALCIUM 6.8* 7.9*     Liver Panel  Recent Labs    07/11/19 0341 07/12/19 0916  PROT 5.2* 6.3*  ALBUMIN 2.0* 2.5*  AST 166* 72*  ALT 203* 228*  ALKPHOS 79 64  BILITOT 0.8 0.5       Sedimentation Rate No results for input(s): ESRSEDRATE in the last 72 hours. C-Reactive Protein Recent Labs    07/10/19 0716 07/12/19 0916  CRP 28.5* 8.2*    Micro Results: Recent Results (from the past 720 hour(s))  Culture, blood (Routine x 2)     Status: None   Collection Time: 07/06/19  3:25 PM   Specimen: BLOOD  Result Value Ref Range Status   Specimen Description   Final    BLOOD RIGHT ANTECUBITAL Performed at [facility] [place], [address] [place], [place] [address]    Special Requests   Final    BOTTLES DRAWN AEROBIC AND ANAEROBIC Blood Culture adequate volume Performed at [facility] [place] [facility], [address] [place], [place] [address]    Culture   Final    NO GROWTH 5 DAYS Performed at [facility], [address] [place], [place] [address]    Report Status 07/11/2019 FINAL  Final  SARS CORONAVIRUS 2 (TAT 6-24 HRS) Nasopharyngeal Nasopharyngeal Swab     Status: None   Collection Time: 07/06/19  3:39 PM   Specimen:  Nasopharyngeal Swab  Result Value Ref Range Status   SARS Coronavirus 2 NEGATIVE NEGATIVE Final    Comment: (NOTE) SARS-CoV-2 target nucleic acids are NOT DETECTED. The SARS-CoV-2 RNA is generally detectable in upper and lower respiratory specimens during the acute phase of infection. Negative results do not preclude SARS-CoV-2 infection, do not rule out co-infections with other pathogens,  and should not be used as the sole basis for treatment or other patient management decisions. Negative results must be combined with clinical observations, patient history, and epidemiological information. The expected result is Negative. Fact Sheet for Patients: HairSlick.no Fact Sheet for Healthcare Providers: quierodirigir.com This test is not yet approved or cleared by the [place] FDA and  has been authorized for detection and/or diagnosis of SARS-CoV-2 by FDA under an Emergency Use Authorization (EUA). This EUA will remain  in effect (meaning this test can be used) for the duration of the COVID-19 declaration under Section 56 4(b)(1) of the Act, 21 U.S.C. section 360bbb-3(b)(1), unless the authorization is terminated or revoked sooner. Performed at [facility], [address] [place], [place] [address]   Culture, blood (Routine x 2)     Status: None   Collection Time: 07/06/19  4:00 PM   Specimen: BLOOD  Result Value Ref Range Status   Specimen Description   Final    BLOOD RIGHT WRIST Performed at [facility], [address] [place], [place] [address]    Special Requests   Final    BOTTLES DRAWN AEROBIC AND ANAEROBIC Blood Culture adequate volume Performed at [facility], [address] [place], [place] [address]    Culture   Final    NO GROWTH 5 DAYS Performed at [facility], [address] [place], [place] [address]    Report Status 07/11/2019 FINAL  Final  Urine culture      Status: None   Collection Time: 07/06/19  4:30 PM   Specimen: Urine, Random  Result Value Ref Range Status   Specimen Description   Final    URINE, RANDOM Performed at [facility], [address] [place], [place] [address]    Special Requests   Final    NONE Performed at [facility], [address] [place], [place] [address]    Culture   Final    NO GROWTH Performed at [facility], [address] [place], [place] [address]    Report Status 07/08/2019 FINAL  Final  Respiratory Panel by PCR     Status: None   Collection Time: 07/06/19  9:42 PM   Specimen: Nasopharyngeal Swab; Respiratory  Result Value Ref Range Status   Adenovirus NOT DETECTED NOT DETECTED Final   Coronavirus 229E NOT DETECTED NOT DETECTED Final    Comment: (NOTE) The Coronavirus on the Respiratory Panel, DOES NOT test for the novel  Coronavirus (2019 nCoV)    Coronavirus HKU1 NOT DETECTED NOT DETECTED Final   Coronavirus NL63 NOT DETECTED NOT DETECTED Final   Coronavirus OC43 NOT DETECTED NOT DETECTED Final   Metapneumovirus NOT DETECTED NOT DETECTED Final   Rhinovirus / Enterovirus NOT DETECTED NOT DETECTED Final   Influenza A NOT DETECTED NOT DETECTED Final   Influenza B NOT DETECTED NOT DETECTED Final   Parainfluenza Virus 1 NOT DETECTED NOT DETECTED Final   Parainfluenza Virus 2 NOT DETECTED NOT DETECTED Final   Parainfluenza Virus 3 NOT DETECTED NOT DETECTED Final   Parainfluenza Virus 4 NOT DETECTED NOT DETECTED Final   Respiratory Syncytial Virus NOT DETECTED NOT DETECTED Final   Bordetella pertussis NOT DETECTED NOT DETECTED Final   Chlamydophila pneumoniae NOT DETECTED NOT DETECTED Final   Mycoplasma pneumoniae NOT DETECTED NOT DETECTED Final    Comment: Performed at [facility], [address], [place], [place] [address]  MRSA PCR Screening     Status: None   Collection Time: 07/07/19  3:32 AM   Specimen: Nasal Mucosa; Nasopharyngeal  Result Value Ref  Range Status   MRSA by PCR NEGATIVE NEGATIVE Final    Comment:        The GeneXpert MRSA Assay (FDA approved for NASAL specimens only), is one component of a comprehensive MRSA colonization surveillance program. It is not intended to diagnose MRSA infection nor to guide or monitor treatment for MRSA infections. Performed at [facility], [address], [place], [place] [address]   CSF culture     Status: None   Collection Time: 07/07/19  1:22 PM   Specimen: PATH Cytology CSF; Cerebrospinal Fluid  Result Value Ref Range Status   Specimen Description   Final    CSF Performed at [facility], [address], [place], [place] [address]    Special Requests   Final    NONE Performed at [facility], [address], [place], [place] [address]    Gram Stain   Final    CYTOSPIN SMEAR WBC PRESENT,BOTH PMN AND MONONUCLEAR NO ORGANISMS SEEN Gram Stain Report Called to,Read Back By and Verified With: HEAVNER,A. RN @1555  ON 11.12.2020 BY COHEN,K Performed at [facility], [address] [place], [place]    Culture   Final    NO GROWTH 3 DAYS Performed at [facility], [address], [place], [place]    Report Status 07/11/2019 FINAL  Final  C difficile quick scan w PCR reflex     Status: None   Collection Time: 07/09/19  2:30 PM   Specimen: STOOL  Result Value Ref Range Status   C Diff antigen NEGATIVE NEGATIVE Final   C Diff toxin NEGATIVE NEGATIVE Final   C Diff interpretation No C. difficile detected.  Final    Comment: Performed at [facility], [address] [place], [place]    Studies/Results: [place] [id number] Laqueta Jean Contrast  Result Date: 07/11/2019 CLINICAL DATA:  Confusion. Meningitis. Fever. Patient as bilateral ground-glass nodules. Rash. EXAM: MRI HEAD WITHOUT AND WITH CONTRAST TECHNIQUE: Multiplanar, multiecho pulse sequences of the brain and  surrounding structures were obtained without and with intravenous contrast. CONTRAST:  20mL GADAVIST GADOBUTROL 1 MMOL/ML IV SOLN COMPARISON:  CT head without contrast 07/06/2019 FINDINGS: Brain: No acute infarct, hemorrhage, or mass lesion is present. No significant white matter lesions are present. The ventricles are of normal size. No significant extraaxial fluid collection is present. The internal auditory canals are within normal limits. The brainstem and cerebellum are within normal limits. Postcontrast images demonstrate no pathologic enhancement. There is no dural thickening or discrete fluid collection. Vascular: Flow is present in the major intracranial arteries. Skull and upper cervical spine: The craniocervical junction is normal. Upper cervical spine is within normal limits. Marrow signal is unremarkable. Sinuses/Orbits: Mild mucosal thickening is present posteriorly in the maxillary sinuses bilaterally. This may represent a polyp or mucous retention cyst. No fluid levels are present. Mastoid air cells are clear. Globes and orbits are within normal limits. IMPRESSION: 1. Normal MRI appearance of the brain. No acute or focal lesion to explain the patient's symptoms. 2. Minimal maxillary sinus disease. Electronically Signed   By: 13/06/2019 M.D.   On: 07/11/2019 14:32      Assessment/Plan:  INTERVAL HISTORY: MRI brain normal   Principal Problem:   Bacterial meningitis Active Problems:  Sepsis (HCC)   Fever   Headache   Rash   Acute respiratory failure with hypoxemia (HCC)   Abnormal liver function   Aseptic meningitis   Leukocytosis    Raymond Owens is a 28 y.o. male withl meningitis (vs possibly RMSF) improving, rash  #1 "Aseptic meningitis" thought pt received antibiotics prior to LP x day with rash and high fevers, despite broad spectrum antibiotics. He has lung nodules as well. He is now seemingly improving on steroids. ? Sarcoid or AI pathology driving thiis  --DC  IV vancomycin --continue ceftriaxone and doxycycline --continue steroids --follow-up biopsy results  #2 Risk for CDI: I have DC high dose PPI as this with abx poses risk for CDI that probiotics will not help attenuate.  Unless he has refractory GERD would not be giviing PPI. While in the ICU he does not meet criteria for SUP   LOS: 5 days   Acey Lav 07/12/2019, 11:46 AM

## 2019-07-12 NOTE — Progress Notes (Signed)
    CC: Rash/skin biopsy request  Subjective: We took the dressing down from his skin biopsy.  Site looks fine.  Objective: Vital signs in last 24 hours: Temp:  [97.9 F (36.6 C)-98.7 F (37.1 C)] 98.4 F (36.9 C) (11/17 0741) Pulse Rate:  [69-98] 98 (11/17 0800) Resp:  [18-38] 24 (11/17 0800) BP: (128-156)/(71-101) 155/97 (11/17 0800) SpO2:  [92 %-98 %] 95 % (11/17 0800) Weight:  [117.6 kg] 117.6 kg (11/17 0411) Last BM Date: 07/11/19  Intake/Output from previous day: 11/16 0701 - 11/17 0700 In: 2241.8 [P.O.:200; IV Piggyback:2041.8] Out: 2450 [Urine:2450] Intake/Output this shift: Total I/O In: 300 [P.O.:300] Out: -   General appearance: alert, cooperative and no distress Skin: Skin color, texture, turgor normal. No rashes or lesions or Skin biopsy site looks fine.  Lab Results:  Recent Labs    07/10/19 0716 07/11/19 1122  WBC 42.2* 34.8*  HGB 12.1* 12.6*  HCT 36.2* 37.8*  PLT 315 305    BMET Recent Labs    07/10/19 0716 07/11/19 0341  NA 134* 138  K 3.3* 4.2  CL 102 110  CO2 22 20*  GLUCOSE 136* 111*  BUN 17 23*  CREATININE 0.70 0.83  CALCIUM 7.6* 6.8*   PT/INR No results for input(s): LABPROT, INR in the last 72 hours.  Recent Labs  Lab 07/06/19 1524 07/07/19 0546 07/08/19 0258 07/09/19 0156 07/10/19 0716 07/11/19 0341  AST 45* 27  --   --  122* 166*  ALT 54* 46*  --   --  135* 203*  ALKPHOS 75 88  --   --  79 79  BILITOT 0.9 1.3*  --   --  0.8 0.8  PROT 7.8 6.8  --   --  5.9* 5.2*  ALBUMIN 3.5 3.1* 2.4* 2.4* 2.2* 2.0*     Lipase  No results found for: LIPASE   Medications: . chlorhexidine  15 mL Mouth Rinse BID  . Chlorhexidine Gluconate Cloth  6 each Topical Daily  . famotidine  20 mg Oral BID  . furosemide  40 mg Intravenous Once  . Gerhardt's butt cream  1 application Topical TID  . heparin injection (subcutaneous)  5,000 Units Subcutaneous Q8H  . lactobacillus  1 g Oral TID WC  . mouth rinse  15 mL Mouth Rinse q12n4p   . pantoprazole  40 mg Oral BID AC  . predniSONE  60 mg Oral Daily  . sodium chloride flush  10-40 mL Intracatheter Q12H    Assessment/Plan Sepsis of uncertain etiology Bacterial meningitis Multiple lung nodules both bases Progressive hypoxia/ARDS development. -Resolved Skin rash  Skin biopsy with 3 mm punch 07/11/2019  Plan: Site looks fine.  We cleaned the site redressed it with triple antibiotic ointment and a Band-Aid.  Please call if we can be of further assistance.      LOS: 5 days    Ervey Fallin 07/12/2019 Please see Amion

## 2019-07-13 LAB — CBC WITH DIFFERENTIAL/PLATELET
Abs Immature Granulocytes: 2.11 10*3/uL — ABNORMAL HIGH (ref 0.00–0.07)
Basophils Absolute: 0 10*3/uL (ref 0.0–0.1)
Basophils Relative: 0 %
Eosinophils Absolute: 0.1 10*3/uL (ref 0.0–0.5)
Eosinophils Relative: 1 %
HCT: 38.2 % — ABNORMAL LOW (ref 39.0–52.0)
Hemoglobin: 12.5 g/dL — ABNORMAL LOW (ref 13.0–17.0)
Immature Granulocytes: 7 %
Lymphocytes Relative: 15 %
Lymphs Abs: 4.6 10*3/uL — ABNORMAL HIGH (ref 0.7–4.0)
MCH: 29.8 pg (ref 26.0–34.0)
MCHC: 32.7 g/dL (ref 30.0–36.0)
MCV: 91.2 fL (ref 80.0–100.0)
Monocytes Absolute: 1.9 10*3/uL — ABNORMAL HIGH (ref 0.1–1.0)
Monocytes Relative: 6 %
Neutro Abs: 21.5 10*3/uL — ABNORMAL HIGH (ref 1.7–7.7)
Neutrophils Relative %: 71 %
Platelets: 306 10*3/uL (ref 150–400)
RBC: 4.19 MIL/uL — ABNORMAL LOW (ref 4.22–5.81)
RDW: 14.1 % (ref 11.5–15.5)
WBC: 30.1 10*3/uL — ABNORMAL HIGH (ref 4.0–10.5)
nRBC: 0 % (ref 0.0–0.2)

## 2019-07-13 LAB — BASIC METABOLIC PANEL
Anion gap: 9 (ref 5–15)
BUN: 16 mg/dL (ref 6–20)
CO2: 22 mmol/L (ref 22–32)
Calcium: 7.7 mg/dL — ABNORMAL LOW (ref 8.9–10.3)
Chloride: 103 mmol/L (ref 98–111)
Creatinine, Ser: 0.64 mg/dL (ref 0.61–1.24)
GFR calc Af Amer: >60 mL/min (ref >60)
GFR calc non Af Amer: >60 mL/min (ref >60)
Glucose, Bld: 103 mg/dL — ABNORMAL HIGH (ref 70–99)
Potassium: 3.6 mmol/L (ref 3.5–5.1)
Sodium: 134 mmol/L — ABNORMAL LOW (ref 135–145)

## 2019-07-13 LAB — PROCALCITONIN: Procalcitonin: 0.27 ng/mL

## 2019-07-13 LAB — C-REACTIVE PROTEIN: CRP: 4.9 mg/dL — ABNORMAL HIGH (ref <1.0)

## 2019-07-13 LAB — SURGICAL PATHOLOGY

## 2019-07-13 NOTE — Progress Notes (Signed)
PROGRESS NOTE    Raymond Owens  QBH:[id number] DOB: August 27, 1990 DOA: 07/06/2019 PCP: Jomarie Longs, PA-C   Brief Narrative:  Patient is a 28 year old male without significant past medical history who presented with complaints of headache, fever, rash on the extremities.  Covid-19 found to be negative.  He was admitted with diagnosis of sepsis of unclear origin.  Suspected to have  meningitis versus .  Underwent LP.Started on broad-spectrum antibiotics with vancomycin, Rocephin, doxycycline.  Chest imaging showed atypical pneumonia, small lung nodules,no evidence of PE.  He had rash.Skin biopsy done and  report pending.  ID following.  Respiratory status has significantly improved today.  He is on room air.  Assessment & Plan:   Principal Problem:   Bacterial meningitis Active Problems:   Sepsis (HCC)   Fever   Headache   Rash   Acute respiratory failure with hypoxemia (HCC)   Abnormal liver function   Aseptic meningitis   Leukocytosis   Sepsis: Presented with tachypnea, tachycardia, leukocytosis.  Started on broad-spectrum antibiotics.  Suspected  meningitis.  Lumbar puncture with more than 400 WBC, PMN predominance.  Presented with photophobia, headache, typical findings of meningitis.  All  symptoms have improved.  [facility] spotted fever a possibility though IgG,IgM is negative.    He had rash on his palms, upper extremities. Has Leukocytosis. Chest imaging showed groundglass opacity, small nodules.  Autoimmune disease/ sarcoidosis also on the differential given his skin, pulmonary finding.Normal  ACE level in CSF and serum.  Histoplasma antigen negative. Started on steroids.  Has elevated ferritin, CRP, D-dimer. Underwent a skin biopsy by general surgery for possible sarcoidosis versus vasculitis.  Follow-up biopsy report. MRI of the brain did not show any acute intracranial normalities.  Acute hypoxic respiratory failure: CT showed multifocal pneumonia like picture,  groundglass, pulmonary nodules.  Respiratory status has significantly improved and currently is on room air.  Hypokalemia/hypophosphatemia: Supplemented  D-dimer: Most likely due to inflammatory status.  CT chest negative for PE.  Transaminitis: Due to sepsis.  Continue to monitor.  Hepatitis panel negative  Diarrhea: Improved.  C. difficile negative            DVT prophylaxis: heparin Ivy Code Status: Full Family Communication: None Disposition Plan: Home after work up is complete.  Waiting for a skin biopsy report.  Needs ID clearance before discharge.   Consultants: ID  Procedures:Skin biopsy,LP  Antimicrobials:  Anti-infectives (From admission, onward)   Start     Dose/Rate Route Frequency Ordered Stop   07/10/19 1200  vancomycin (VANCOCIN) 1,500 mg in sodium chloride 0.9 % 500 mL IVPB  Status:  Discontinued     1,500 mg 250 mL/hr over 120 Minutes Intravenous Every 8 hours 07/10/19 1156 07/12/19 1052   07/07/19 1600  vancomycin (VANCOCIN) 1,750 mg in sodium chloride 0.9 % 500 mL IVPB  Status:  Discontinued     1,750 mg 250 mL/hr over 120 Minutes Intravenous Every 12 hours 07/07/19 0605 07/07/19 1120   07/07/19 1600  cefTRIAXone (ROCEPHIN) 2 g in sodium chloride 0.9 % 100 mL IVPB     2 g 200 mL/hr over 30 Minutes Intravenous Every 12 hours 07/07/19 1108     07/07/19 1200  vancomycin (VANCOCIN) IVPB 1000 mg/200 mL premix  Status:  Discontinued     1,000 mg 200 mL/hr over 60 Minutes Intravenous Every 8 hours 07/07/19 1120 07/10/19 1153   07/07/19 0445  doxycycline (VIBRAMYCIN) 100 mg in sodium chloride 0.9 % 250 mL IVPB  100 mg 125 mL/hr over 120 Minutes Intravenous Every 12 hours 07/07/19 0440     07/07/19 0400  vancomycin (VANCOCIN) 1,750 mg in sodium chloride 0.9 % 500 mL IVPB  Status:  Discontinued     1,750 mg 250 mL/hr over 120 Minutes Intravenous Every 8 hours 07/06/19 1605 07/06/19 2122   07/07/19 0200  vancomycin (VANCOCIN) 1,500 mg in sodium chloride 0.9  % 500 mL IVPB  Status:  Discontinued     1,500 mg 250 mL/hr over 120 Minutes Intravenous Every 8 hours 07/06/19 2122 07/07/19 0605   07/07/19 0000  ceFEPIme (MAXIPIME) 2 g in sodium chloride 0.9 % 100 mL IVPB  Status:  Discontinued     2 g 200 mL/hr over 30 Minutes Intravenous Every 8 hours 07/06/19 1606 07/07/19 1108   07/06/19 2230  acyclovir (ZOVIRAX) 750 mg in dextrose 5 % 150 mL IVPB  Status:  Discontinued     750 mg 165 mL/hr over 60 Minutes Intravenous Every 8 hours 07/06/19 2217 07/07/19 1108   07/06/19 1700  vancomycin (VANCOCIN) IVPB 1000 mg/200 mL premix     1,000 mg 200 mL/hr over 60 Minutes Intravenous Every 1 hr x 2 07/06/19 1603 07/06/19 1828   07/06/19 1600  ceFEPIme (MAXIPIME) 2 g in sodium chloride 0.9 % 100 mL IVPB     2 g 200 mL/hr over 30 Minutes Intravenous  Once 07/06/19 1547 07/06/19 1644   07/06/19 1600  metroNIDAZOLE (FLAGYL) IVPB 500 mg     500 mg 100 mL/hr over 60 Minutes Intravenous  Once 07/06/19 1547 07/06/19 1936   07/06/19 1600  vancomycin (VANCOCIN) IVPB 1000 mg/200 mL premix  Status:  Discontinued     1,000 mg 200 mL/hr over 60 Minutes Intravenous  Once 07/06/19 1547 07/06/19 1603      Subjective:  Patient seen and examined the bedside this morning.  Hemodynamically stable.  Looks very comfortable today.  Respiratory status stable.  Off oxygen.  Denies any new complaints  Objective: Vitals:   07/12/19 2100 07/13/19 0000 07/13/19 0500 07/13/19 0800  BP:      Pulse: 72     Resp: 17     Temp:  98.4 F (36.9 C)  97.9 F (36.6 C)  TempSrc:  Oral  Oral  SpO2: 96%     Weight:   115.1 kg   Height:        Intake/Output Summary (Last 24 hours) at 07/13/2019 1117 Last data filed at 07/13/2019 2130 Gross per 24 hour  Intake 789.22 ml  Output 1900 ml  Net -1110.78 ml   Filed Weights   07/11/19 0350 07/12/19 0411 07/13/19 0500  Weight: 116.3 kg 117.6 kg 115.1 kg    Examination:  General exam: Appears calm and comfortable ,Not in  distress,average built HEENT:PERRL,Oral mucosa moist, Ear/Nose normal on gross exam Respiratory system: Bilateral diminished breath sounds on the bases, no wheezes or crackles  Cardiovascular system: S1 & S2 heard, RRR. No JVD, murmurs, rubs, gallops or clicks. Trace pedal edema. Gastrointestinal system: Abdomen is nondistended, soft and nontender. No organomegaly or masses felt. Normal bowel sounds heard. Central nervous system: Alert and oriented. No focal neurological deficits. Extremities: No edema, no clubbing ,no cyanosis, distal peripheral pulses palpable. Skin: No rashes, lesions or ulcers,no icterus ,no pallor MSK: Normal muscle bulk,tone ,power Psychiatry: Judgement and insight appear normal. Mood & affect appropriate.     Data Reviewed: I have personally reviewed following labs and imaging studies  CBC: Recent Labs  Lab 07/08/19  8295 07/09/19 0156 07/10/19 0716 07/11/19 1122 07/12/19 0916 07/13/19 0145  WBC 26.2* 30.6* 42.2* 34.8* 26.4* 30.1*  NEUTROABS 22.1* 26.2* 36.5*  --  20.6* 21.5*  HGB 12.1* 12.9* 12.1* 12.6* 12.8* 12.5*  HCT 35.7* 38.4* 36.2* 37.8* 39.4 38.2*  MCV 88.4 89.5 90.5 90.6 92.3 91.2  PLT 240 288 315 305 381 306   Basic Metabolic Panel: Recent Labs  Lab 07/06/19 1526  07/08/19 0258 07/09/19 0156 07/09/19 1437 07/10/19 0716 07/11/19 0341 07/12/19 0916 07/13/19 0145  NA  --    < > 136 133* 135 134* 138 137 134*  K  --    < > 3.2* 3.0* 3.1* 3.3* 4.2 3.4* 3.6  CL  --    < > 104 102 104 102 110 105 103  CO2  --    < > 20* 22 22  GLUCOSE  --    < > 100* 138* 124* 136* 111* 142* 103*  BUN  --    < > 23* 22* 16  CREATININE  --    < > 0.74 0.89 0.89 0.70 0.83 0.83 0.64  CALCIUM  --    < > 8.0* 7.7* 7.9* 7.6* 6.8* 7.9* 7.7*  MG 1.7  --  1.9  --  2.1  --   --   --   --   PHOS  --   --  1.8* 1.4*  --  3.3 3.5 2.8  --    < > = values in this interval not displayed.   GFR: Estimated Creatinine Clearance: 178.9 mL/min (by  C-G formula based on SCr of 0.64 mg/dL). Liver Function Tests: Recent Labs  Lab 07/06/19 1524 07/07/19 0546 07/08/19 0258 07/09/19 0156 07/10/19 0716 07/11/19 0341 07/12/19 0916  AST 45* 27  --   --  122* 166* 72*  ALT 54* 46*  --   --  135* 203* 228*  ALKPHOS 75 88  --   --  79 79 64  BILITOT 0.9 1.3*  --   --  0.8 0.8 0.5  PROT 7.8 6.8  --   --  5.9* 5.2* 6.3*  ALBUMIN 3.5 3.1* 2.4* 2.4* 2.2* 2.0* 2.5*   No results for input(s): LIPASE, AMYLASE in the last 168 hours. No results for input(s): AMMONIA in the last 168 hours. Coagulation Profile: Recent Labs  Lab 07/06/19 1524  INR 1.4*   Cardiac Enzymes: Recent Labs  Lab 07/07/19 0546  CKTOTAL 89  CKMB 2.7   BNP (last 3 results) No results for input(s): PROBNP in the last 8760 hours. HbA1C: No results for input(s): HGBA1C in the last 72 hours. CBG: Recent Labs  Lab 07/07/19 1656  GLUCAP 89   Lipid Profile: No results for input(s): CHOL, HDL, LDLCALC, TRIG, CHOLHDL, LDLDIRECT in the last 72 hours. Thyroid Function Tests: No results for input(s): TSH, T4TOTAL, FREET4, T3FREE, THYROIDAB in the last 72 hours. Anemia Panel: No results for input(s): VITAMINB12, FOLATE, FERRITIN, TIBC, IRON, RETICCTPCT in the last 72 hours. Sepsis Labs: Recent Labs  Lab 07/06/19 1525 07/06/19 1735 07/06/19 2132 07/07/19 1340  07/10/19 0716 07/11/19 0500 07/12/19 0916 07/13/19 0145  PROCALCITON  --   --  3.33  --    < > 2.46 1.95 0.54 0.27  LATICACIDVEN 3.3* 2.3* 2.2* 1.7  --   --   --   --   --    < > = values in this interval not displayed.    Recent  Results (from the past 240 hour(s))  Culture, blood (Routine x 2)     Status: None   Collection Time: 07/06/19  3:25 PM   Specimen: BLOOD  Result Value Ref Range Status   Specimen Description   Final    BLOOD RIGHT ANTECUBITAL Performed at [facility], [address] [place], [place] [address]    Special Requests   Final    BOTTLES DRAWN AEROBIC AND  ANAEROBIC Blood Culture adequate volume Performed at [facility], [address] [place], [place] [address]    Culture   Final    NO GROWTH 5 DAYS Performed at [facility], [place] [address], [place], [place] [address]    Report Status 07/11/2019 FINAL  Final  SARS CORONAVIRUS 2 (TAT 6-24 HRS) Nasopharyngeal Nasopharyngeal Swab     Status: None   Collection Time: 07/06/19  3:39 PM   Specimen: Nasopharyngeal Swab  Result Value Ref Range Status   SARS Coronavirus 2 NEGATIVE NEGATIVE Final    Comment: (NOTE) SARS-CoV-2 target nucleic acids are NOT DETECTED. The SARS-CoV-2 RNA is generally detectable in upper and lower respiratory specimens during the acute phase of infection. Negative results do not preclude SARS-CoV-2 infection, do not rule out co-infections with other pathogens, and should not be used as the sole basis for treatment or other patient management decisions. Negative results must be combined with clinical observations, patient history, and epidemiological information. The expected result is Negative. Fact Sheet for Patients: SugarRoll.be Fact Sheet for Healthcare Providers: https://www.woods-mathews.com/ This test is not yet approved or cleared by the [place] FDA and  has been authorized for detection and/or diagnosis of SARS-CoV-2 by FDA under an Emergency Use Authorization (EUA). This EUA will remain  in effect (meaning this test can be used) for the duration of the COVID-19 declaration under Section 56 4(b)(1) of the Act, 21 U.S.C. section 360bbb-3(b)(1), unless the authorization is terminated or revoked sooner. Performed at [facility], [place] [address], [place], [place] [address]   Culture, blood (Routine x 2)     Status: None   Collection Time: 07/06/19  4:00 PM   Specimen: BLOOD  Result Value Ref Range Status   Specimen Description   Final    BLOOD RIGHT WRIST Performed at [facility], [address] [place], [place] [address]    Special Requests   Final    BOTTLES DRAWN AEROBIC AND ANAEROBIC Blood Culture adequate volume Performed at [facility], [address] [place], [place] [address]    Culture   Final    NO GROWTH 5 DAYS Performed at [facility], [place] [address], [place], [place] [address]    Report Status 07/11/2019 FINAL  Final  Urine culture     Status: None   Collection Time: 07/06/19  4:30 PM   Specimen: Urine, Random  Result Value Ref Range Status   Specimen Description   Final    URINE, RANDOM Performed at [facility], [address] [place], [place] [address]    Special Requests   Final    NONE Performed at [facility], [address] [place], [place] [address]    Culture   Final    NO GROWTH Performed at [facility], [place] [address], [place], [place] [address]    Report Status 07/08/2019 FINAL  Final  Respiratory Panel by PCR  Status: None   Collection Time: 07/06/19  9:42 PM   Specimen: Nasopharyngeal Swab; Respiratory  Result Value Ref Range Status   Adenovirus NOT DETECTED NOT DETECTED Final   Coronavirus 229E NOT DETECTED NOT DETECTED Final    Comment: (NOTE) The Coronavirus on the Respiratory Panel, DOES NOT test for the novel  Coronavirus (2019 nCoV)    Coronavirus HKU1 NOT DETECTED NOT DETECTED Final   Coronavirus NL63 NOT DETECTED NOT DETECTED Final   Coronavirus OC43 NOT DETECTED NOT DETECTED Final   Metapneumovirus NOT DETECTED NOT DETECTED Final   Rhinovirus / Enterovirus NOT DETECTED NOT DETECTED Final   Influenza A NOT DETECTED NOT DETECTED Final   Influenza B NOT DETECTED NOT DETECTED Final   Parainfluenza Virus 1 NOT DETECTED NOT DETECTED Final   Parainfluenza Virus 2 NOT DETECTED NOT DETECTED Final   Parainfluenza Virus 3 NOT DETECTED NOT DETECTED Final   Parainfluenza Virus 4 NOT DETECTED NOT DETECTED Final   Respiratory Syncytial Virus NOT DETECTED  NOT DETECTED Final   Bordetella pertussis NOT DETECTED NOT DETECTED Final   Chlamydophila pneumoniae NOT DETECTED NOT DETECTED Final   Mycoplasma pneumoniae NOT DETECTED NOT DETECTED Final    Comment: Performed at [facility], [address] [place], [place] [address]  MRSA PCR Screening     Status: None   Collection Time: 07/07/19  3:32 AM   Specimen: Nasal Mucosa; Nasopharyngeal  Result Value Ref Range Status   MRSA by PCR NEGATIVE NEGATIVE Final    Comment:        The GeneXpert MRSA Assay (FDA approved for NASAL specimens only), is one component of a comprehensive MRSA colonization surveillance program. It is not intended to diagnose MRSA infection nor to guide or monitor treatment for MRSA infections. Performed at [facility], [address] [place], [place] [address]   CSF culture     Status: None   Collection Time: 07/07/19  1:22 PM   Specimen: PATH Cytology CSF; Cerebrospinal Fluid  Result Value Ref Range Status   Specimen Description   Final    CSF Performed at [facility], [address] [place], [place] [address]    Special Requests   Final    NONE Performed at [facility], [address] [place], [place] [address]    Gram Stain   Final    CYTOSPIN SMEAR WBC PRESENT,BOTH PMN AND MONONUCLEAR NO ORGANISMS SEEN Gram Stain Report Called to,Read Back By and Verified With: HEAVNER,A. RN @1555  ON 11.12.2020 BY COHEN,K Performed at [facility], [address] [place], [place] [address]    Culture   Final    NO GROWTH 3 DAYS Performed at [facility], [address] [place], [place] [address]    Report Status 07/11/2019 FINAL  Final  C difficile quick scan w PCR reflex     Status: None   Collection Time: 07/09/19  2:30 PM   Specimen: STOOL  Result Value Ref Range Status   C Diff antigen NEGATIVE NEGATIVE Final   C Diff toxin NEGATIVE NEGATIVE Final   C Diff  interpretation No C. difficile detected.  Final    Comment: Performed at [facility], [address] [place], [place] [address]         Radiology Studies: Mr Laqueta JeanBrain W NGWo Contrast  Result Date: 07/11/2019 CLINICAL DATA:  Confusion. Meningitis. Fever. Patient as bilateral ground-glass nodules. Rash. EXAM: MRI HEAD WITHOUT AND WITH  CONTRAST TECHNIQUE: Multiplanar, multiecho pulse sequences of the brain and surrounding structures were obtained without and with intravenous contrast. CONTRAST:  10mL GADAVIST GADOBUTROL 1 MMOL/ML IV SOLN COMPARISON:  CT head without contrast 07/06/2019 FINDINGS: Brain: No acute infarct, hemorrhage, or mass lesion is present. No significant white matter lesions are present. The ventricles are of normal size. No significant extraaxial fluid collection is present. The internal auditory canals are within normal limits. The brainstem and cerebellum are within normal limits. Postcontrast images demonstrate no pathologic enhancement. There is no dural thickening or discrete fluid collection. Vascular: Flow is present in the major intracranial arteries. Skull and upper cervical spine: The craniocervical junction is normal. Upper cervical spine is within normal limits. Marrow signal is unremarkable. Sinuses/Orbits: Mild mucosal thickening is present posteriorly in the maxillary sinuses bilaterally. This may represent a polyp or mucous retention cyst. No fluid levels are present. Mastoid air cells are clear. Globes and orbits are within normal limits. IMPRESSION: 1. Normal MRI appearance of the brain. No acute or focal lesion to explain the patient's symptoms. 2. Minimal maxillary sinus disease. Electronically Signed   By: Marin Roberts M.D.   On: 07/11/2019 14:32        Scheduled Meds:  chlorhexidine  15 mL Mouth Rinse BID   Chlorhexidine Gluconate Cloth  6 each Topical Daily   famotidine  20 mg Oral BID   furosemide  40 mg Intravenous Once    Gerhardt's butt cream  1 application Topical TID   heparin injection (subcutaneous)  5,000 Units Subcutaneous Q8H   mouth rinse  15 mL Mouth Rinse q12n4p   predniSONE  60 mg Oral Daily   sodium chloride flush  10-40 mL Intracatheter Q12H   Continuous Infusions:  sodium chloride Stopped (07/07/19 0438)   sodium chloride Stopped (07/10/19 0308)   cefTRIAXone (ROCEPHIN)  IV Stopped (07/13/19 0402)   doxycycline (VIBRAMYCIN) IV Stopped (07/13/19 0834)   sodium chloride       LOS: 6 days    Time spent: 25 mins.More than 50% of that time was spent in counseling and/or coordination of care.      Burnadette Pop, MD [facility] Pager [phone]  If 7PM-7AM, please contact night-coverage [email or web address] Password [email or web address] 07/13/2019, 11:17 AM

## 2019-07-13 NOTE — Progress Notes (Signed)
PHARMACY CONSULT NOTE FOR:  OUTPATIENT  PARENTERAL ANTIBIOTIC THERAPY (OPAT)  Indication: meningitis Regimen: ceftriaxone 2gm IV q12h End date: 07/19/2019  IV antibiotic discharge orders are pended. To discharging provider:  please sign these orders via discharge navigator,  Select New Orders & click on the button choice - Manage This Unsigned Work.     Thank you for allowing pharmacy to be a part of this patient's care.  Dolly Rias RPh 07/13/2019, 1:57 PM

## 2019-07-13 NOTE — Progress Notes (Addendum)
Subjective:  Insomnia last night   Antibiotics:  Anti-infectives (From admission, onward)   Start     Dose/Rate Route Frequency Ordered Stop   07/10/19 1200  vancomycin (VANCOCIN) 1,500 mg in sodium chloride 0.9 % 500 mL IVPB  Status:  Discontinued     1,500 mg 250 mL/hr over 120 Minutes Intravenous Every 8 hours 07/10/19 1156 07/12/19 1052   07/07/19 1600  vancomycin (VANCOCIN) 1,750 mg in sodium chloride 0.9 % 500 mL IVPB  Status:  Discontinued     1,750 mg 250 mL/hr over 120 Minutes Intravenous Every 12 hours 07/07/19 0605 07/07/19 1120   07/07/19 1600  cefTRIAXone (ROCEPHIN) 2 g in sodium chloride 0.9 % 100 mL IVPB     2 g 200 mL/hr over 30 Minutes Intravenous Every 12 hours 07/07/19 1108     07/07/19 1200  vancomycin (VANCOCIN) IVPB 1000 mg/200 mL premix  Status:  Discontinued     1,000 mg 200 mL/hr over 60 Minutes Intravenous Every 8 hours 07/07/19 1120 07/10/19 1153   07/07/19 0445  doxycycline (VIBRAMYCIN) 100 mg in sodium chloride 0.9 % 250 mL IVPB     100 mg 125 mL/hr over 120 Minutes Intravenous Every 12 hours 07/07/19 0440     07/07/19 0400  vancomycin (VANCOCIN) 1,750 mg in sodium chloride 0.9 % 500 mL IVPB  Status:  Discontinued     1,750 mg 250 mL/hr over 120 Minutes Intravenous Every 8 hours 07/06/19 1605 07/06/19 2122   07/07/19 0200  vancomycin (VANCOCIN) 1,500 mg in sodium chloride 0.9 % 500 mL IVPB  Status:  Discontinued     1,500 mg 250 mL/hr over 120 Minutes Intravenous Every 8 hours 07/06/19 2122 07/07/19 0605   07/07/19 0000  ceFEPIme (MAXIPIME) 2 g in sodium chloride 0.9 % 100 mL IVPB  Status:  Discontinued     2 g 200 mL/hr over 30 Minutes Intravenous Every 8 hours 07/06/19 1606 07/07/19 1108   07/06/19 2230  acyclovir (ZOVIRAX) 750 mg in dextrose 5 % 150 mL IVPB  Status:  Discontinued     750 mg 165 mL/hr over 60 Minutes Intravenous Every 8 hours 07/06/19 2217 07/07/19 1108   07/06/19 1700  vancomycin (VANCOCIN) IVPB 1000 mg/200 mL premix      1,000 mg 200 mL/hr over 60 Minutes Intravenous Every 1 hr x 2 07/06/19 1603 07/06/19 1828   07/06/19 1600  ceFEPIme (MAXIPIME) 2 g in sodium chloride 0.9 % 100 mL IVPB     2 g 200 mL/hr over 30 Minutes Intravenous  Once 07/06/19 1547 07/06/19 1644   07/06/19 1600  metroNIDAZOLE (FLAGYL) IVPB 500 mg     500 mg 100 mL/hr over 60 Minutes Intravenous  Once 07/06/19 1547 07/06/19 1936   07/06/19 1600  vancomycin (VANCOCIN) IVPB 1000 mg/200 mL premix  Status:  Discontinued     1,000 mg 200 mL/hr over 60 Minutes Intravenous  Once 07/06/19 1547 07/06/19 1603      Medications: Scheduled Meds: . chlorhexidine  15 mL Mouth Rinse BID  . Chlorhexidine Gluconate Cloth  6 each Topical Daily  . famotidine  20 mg Oral BID  . furosemide  40 mg Intravenous Once  . Gerhardt's butt cream  1 application Topical TID  . heparin injection (subcutaneous)  5,000 Units Subcutaneous Q8H  . mouth rinse  15 mL Mouth Rinse q12n4p  . predniSONE  60 mg Oral Daily  . sodium chloride flush  10-40 mL Intracatheter Q12H  Continuous Infusions: . sodium chloride Stopped (07/07/19 0438)  . sodium chloride Stopped (07/10/19 0308)  . cefTRIAXone (ROCEPHIN)  IV Stopped (07/13/19 0402)  . doxycycline (VIBRAMYCIN) IV Stopped (07/13/19 0834)  . sodium chloride     PRN Meds:.sodium chloride, sodium chloride, acetaminophen **OR** acetaminophen, hydrALAZINE, lidocaine, loperamide, ondansetron (ZOFRAN) IV, simethicone, sodium chloride flush    Objective: Weight change: -2.5 kg  Intake/Output Summary (Last 24 hours) at 07/13/2019 1529 Last data filed at 07/13/2019 1300 Gross per 24 hour  Intake 1149.22 ml  Output 3277 ml  Net -2127.78 ml   Blood pressure (!) 153/56, pulse 94, temperature 97.9 F (36.6 C), temperature source Oral, resp. rate (!) 27, height 5\' 11"  (1.803 m), weight 115.1 kg, SpO2 99 %. Temp:  [97.9 F (36.6 C)-98.5 F (36.9 C)] 97.9 F (36.6 C) (11/18 1200) Pulse Rate:  [71-96] 94 (11/18 1419)  Resp:  [17-29] 27 (11/18 1419) BP: (148-159)/(56-77) 153/56 (11/18 1419) SpO2:  [94 %-99 %] 99 % (11/18 1419) Weight:  [115.1 kg] 115.1 kg (11/18 0500)  Physical Exam: General: Alert and awake, oriented x3, not in any acute distress, wife at bedside Rash is improving Neuro: nonfocal  CBC:  07/08/2019:     07/11/2019:       Rash is resolving   BMET Recent Labs    07/12/19 0916 07/13/19 0145  NA 137 134*  K 3.4* 3.6  CL 105 103  CO2 22 22  GLUCOSE 142* 103*  BUN 22* 16  CREATININE 0.83 0.64  CALCIUM 7.9* 7.7*     Liver Panel  Recent Labs    07/11/19 0341 07/12/19 0916  PROT 5.2* 6.3*  ALBUMIN 2.0* 2.5*  AST 166* 72*  ALT 203* 228*  ALKPHOS 79 64  BILITOT 0.8 0.5       Sedimentation Rate No results for input(s): ESRSEDRATE in the last 72 hours. C-Reactive Protein Recent Labs    07/12/19 0916 07/13/19 0145  CRP 8.2* 4.9*    Micro Results: Recent Results (from the past 720 hour(s))  Culture, blood (Routine x 2)     Status: None   Collection Time: 07/06/19  3:25 PM   Specimen: BLOOD  Result Value Ref Range Status   Specimen Description   Final    BLOOD RIGHT ANTECUBITAL Performed at [facility] [place], [address] [place], [place] [address]    Special Requests   Final    BOTTLES DRAWN AEROBIC AND ANAEROBIC Blood Culture adequate volume Performed at [facility], [address] [place], [place] [address]    Culture   Final    NO GROWTH 5 DAYS Performed at [facility], [address] [place], [place] [address]    Report Status 07/11/2019 FINAL  Final  SARS CORONAVIRUS 2 (TAT 6-24 HRS) Nasopharyngeal Nasopharyngeal Swab     Status: None   Collection Time: 07/06/19  3:39 PM   Specimen: Nasopharyngeal Swab  Result Value Ref Range Status   SARS Coronavirus 2 NEGATIVE NEGATIVE Final    Comment: (NOTE) SARS-CoV-2 target nucleic acids are NOT DETECTED. The SARS-CoV-2 RNA is generally detectable in upper and  lower respiratory specimens during the acute phase of infection. Negative results do not preclude SARS-CoV-2 infection, do not rule out co-infections with other pathogens, and should not be used as the sole basis for treatment or other patient management decisions. Negative results must be combined with clinical observations, patient history, and epidemiological information. The expected result is Negative. Fact Sheet  for Patients: https://www.fda.gov/media/138098/download Fact Sheet for Healthcare Providers: quierodirigir.com This test is not yet approved or cleared by the [place] FDA and  [employer] diagnosis of SARS-CoV-2 by FDA under an Emergency Use Authorization (EUA). This EUA will remain  in effect (meaning this test can be used) for the duration of the COVID-19 declaration under Section 56 4(b)(1) of the Act, 21 U.S.C. section 360bbb-3(b)(1), unless the authorization is terminated or revoked sooner. Performed at [facility], [address], [place], [place] [address]   Culture, blood (Routine x 2)     Status: None   Collection Time: 07/06/19  4:00 PM   Specimen: BLOOD  Result Value Ref Range Status   Specimen Description   Final    BLOOD RIGHT WRIST Performed at [facility], [address] [place], [place] [address]    Special Requests   Final    BOTTLES DRAWN AEROBIC AND ANAEROBIC Blood Culture adequate volume Performed at [facility], [address] [place], [place] [address]    Culture   Final    NO GROWTH 5 DAYS Performed at [facility], [address], [place], [place] [address]    Report Status 07/11/2019 FINAL  Final  Urine culture     Status: None   Collection Time: 07/06/19  4:30 PM   Specimen: Urine, Random  Result Value Ref Range Status   Specimen Description   Final    URINE, RANDOM Performed at [facility], [address] [place], [place] [address]    Special Requests   Final    NONE Performed at [facility], [address] [place], [place] [address]    Culture   Final    NO GROWTH Performed at [facility], [address], [place], [place] [address]    Report Status 07/08/2019 FINAL  Final  Respiratory Panel by PCR     Status: None   Collection Time: 07/06/19  9:42 PM   Specimen: Nasopharyngeal Swab; Respiratory  Result Value Ref Range Status   Adenovirus NOT DETECTED NOT DETECTED Final   Coronavirus 229E NOT DETECTED NOT DETECTED Final    Comment: (NOTE) The Coronavirus on the Respiratory Panel, DOES NOT test for the novel  Coronavirus (2019 nCoV)    Coronavirus HKU1 NOT DETECTED NOT DETECTED Final   Coronavirus NL63 NOT DETECTED NOT DETECTED Final   Coronavirus OC43 NOT DETECTED NOT DETECTED Final   Metapneumovirus NOT DETECTED NOT DETECTED Final   Rhinovirus / Enterovirus NOT DETECTED NOT DETECTED Final   Influenza A NOT DETECTED NOT DETECTED Final   Influenza B NOT DETECTED NOT DETECTED Final   Parainfluenza Virus 1 NOT DETECTED NOT DETECTED Final   Parainfluenza Virus 2 NOT DETECTED NOT DETECTED Final   Parainfluenza Virus 3 NOT DETECTED NOT DETECTED Final   Parainfluenza Virus 4 NOT DETECTED NOT DETECTED Final   Respiratory Syncytial Virus NOT DETECTED NOT DETECTED Final   Bordetella pertussis NOT DETECTED NOT DETECTED Final   Chlamydophila pneumoniae NOT DETECTED NOT DETECTED Final   Mycoplasma pneumoniae NOT DETECTED NOT DETECTED Final    Comment: Performed at [facility], [address], [place], [place] [address]  MRSA PCR Screening     Status: None   Collection Time: 07/07/19  3:32 AM   Specimen: Nasal Mucosa; Nasopharyngeal  Result Value Ref Range Status   MRSA by PCR NEGATIVE NEGATIVE  Final    Comment:        The GeneXpert MRSA Assay (FDA approved for NASAL specimens only), is one component of a comprehensive MRSA colonization surveillance program. It is not  intended to diagnose MRSA infection nor to guide or monitor treatment for MRSA infections. Performed at [facility], [address], [place], [place] [address]   CSF culture     Status: None   Collection Time: 07/07/19  1:22 PM   Specimen: PATH Cytology CSF; Cerebrospinal Fluid  Result Value Ref Range Status   Specimen Description   Final    CSF Performed at [facility], [address], [place], [place] [address]    Special Requests   Final    NONE Performed at [facility], [address] [place], [place] [address]    Gram Stain   Final    CYTOSPIN SMEAR WBC PRESENT,BOTH PMN AND MONONUCLEAR NO ORGANISMS SEEN Gram Stain Report Called to,Read Back By and Verified With: HEAVNER,A. RN  ON 11.12.2020 BY COHEN,K Performed at [facility], [address] [place], [place] [address]    Culture   Final    NO GROWTH 3 DAYS Performed at [facility], [address] [place], [place] [address]    Report Status 07/11/2019 FINAL  Final  C difficile quick scan w PCR reflex     Status: None   Collection Time: 07/09/19  2:30 PM   Specimen: STOOL  Result Value Ref Range Status   C Diff antigen NEGATIVE NEGATIVE Final   C Diff toxin NEGATIVE NEGATIVE Final   C Diff interpretation No C. difficile detected.  Final    Comment: Performed at [facility], [address], [place], [place] [address]    Studies/Results: No results found.    Assessment/Plan:  INTERVAL HISTORY: he is afebrile off of vancomycin, path result not very conclusive suggested drug reaction or viral exanthem   Principal Problem:   Bacterial meningitis Active Problems:   Sepsis (HCC)   Fever   Headache   Rash   Acute respiratory failure with hypoxemia (HCC)   Abnormal liver function   Aseptic meningitis   Leukocytosis    Raymond Owens is a 28 y.o. male withl meningitis (vs possibly RMSF) improving,  rash  #1 "Aseptic meningitis" thought pt received antibiotics prior to LP x day with rash and high fevers, despite broad spectrum antibiotics. He has lung nodules as well. He is now seemingly improving on steroids. ? Sarcoid or AI pathology driving thiis Biopsy  Doesn't firm up any diagnosis for me at this point. I wonder if this could be "Still's disease."  --would complete 2 weeks of ceftriaxone --10 days of doxycycliine --would continue steroids and not wean  Until into week #2 of treatment --ensure he is connected to Rheumatology for followup   --can followup with PCP and have Ehrlichia and RMSF titers repeated though I really dont  Think this a tickborne pathology  Diagnosis: Aseptic meningitis  Culture Result: NO GROWTH  Allergies  Allergen Reactions  . Penicillins Rash    Did it involve swelling of the face/tongue/throat, SOB, or low BP? No Did it involve sudden or severe rash/hives, skin peeling, or any reaction on the inside of your mouth or nose? Yes Did you need to seek medical attention at a hospital or doctor's office? No When did it last happen?unknown If all above answers are "NO", may proceed with cephalosporin use.  OPAT Orders Discharge antibiotics:   Ceftriaxone 2 g IV q 12 hrs   Duration:  2 weeks total abx End Date:  07/19/2019  [facility] Per Protocol:    Labs  weekly while on IV antibiotics: x__ CBC with differential x__ BMP w GFR/CMP   x__ Please pull midline  at completion of IV antibiotics __ Please leave PIC in place until doctor has seen patient or been notified  Fax weekly labs to [phone]  Clinic Follow Up Appt: No need for ID clinic followup.    LOS: 6 days   Acey Lav 07/13/2019, 3:29 PM

## 2019-07-14 ENCOUNTER — Encounter (HOSPITAL_COMMUNITY): Payer: Self-pay

## 2019-07-14 DIAGNOSIS — A419 Sepsis, unspecified organism: Secondary | ICD-10-CM | POA: Diagnosis not present

## 2019-07-14 DIAGNOSIS — G030 Nonpyogenic meningitis: Secondary | ICD-10-CM | POA: Diagnosis not present

## 2019-07-14 LAB — CBC WITH DIFFERENTIAL/PLATELET
Abs Immature Granulocytes: 2.25 10*3/uL — ABNORMAL HIGH (ref 0.00–0.07)
Basophils Absolute: 0 10*3/uL (ref 0.0–0.1)
Basophils Relative: 0 %
Eosinophils Absolute: 0.2 10*3/uL (ref 0.0–0.5)
Eosinophils Relative: 1 %
HCT: 37.8 % — ABNORMAL LOW (ref 39.0–52.0)
Hemoglobin: 12.5 g/dL — ABNORMAL LOW (ref 13.0–17.0)
Immature Granulocytes: 7 %
Lymphocytes Relative: 14 %
Lymphs Abs: 4.5 10*3/uL — ABNORMAL HIGH (ref 0.7–4.0)
MCH: 30.5 pg (ref 26.0–34.0)
MCHC: 33.1 g/dL (ref 30.0–36.0)
MCV: 92.2 fL (ref 80.0–100.0)
Monocytes Absolute: 1.6 10*3/uL — ABNORMAL HIGH (ref 0.1–1.0)
Monocytes Relative: 5 %
Neutro Abs: 23.3 10*3/uL — ABNORMAL HIGH (ref 1.7–7.7)
Neutrophils Relative %: 73 %
Platelets: 436 10*3/uL — ABNORMAL HIGH (ref 150–400)
RBC: 4.1 MIL/uL — ABNORMAL LOW (ref 4.22–5.81)
RDW: 14.2 % (ref 11.5–15.5)
WBC: 31.8 10*3/uL — ABNORMAL HIGH (ref 4.0–10.5)
nRBC: 0 % (ref 0.0–0.2)

## 2019-07-14 LAB — C-REACTIVE PROTEIN: CRP: 2.6 mg/dL — ABNORMAL HIGH (ref <1.0)

## 2019-07-14 MED ORDER — PREDNISONE 10 MG PO TABS: ORAL_TABLET | ORAL | 0 refills | Status: DC

## 2019-07-14 MED ORDER — FAMOTIDINE 20 MG PO TABS: 20.0000 mg | ORAL_TABLET | Freq: Two times a day (BID) | ORAL | 0 refills | Status: DC

## 2019-07-14 MED ORDER — CEFTRIAXONE IV (FOR PTA / DISCHARGE USE ONLY): 2.0000 g | Freq: Two times a day (BID) | INTRAVENOUS | 0 refills | Status: DC

## 2019-07-14 MED ORDER — BACID PO TABS: 2.0000 | ORAL_TABLET | Freq: Three times a day (TID) | ORAL | 0 refills | Status: AC

## 2019-07-14 MED ORDER — DOXYCYCLINE MONOHYDRATE 100 MG PO TABS: 100.0000 mg | ORAL_TABLET | Freq: Two times a day (BID) | ORAL | 0 refills | Status: DC

## 2019-07-14 MED ORDER — RISAQUAD PO CAPS
2.0000 | ORAL_CAPSULE | Freq: Three times a day (TID) | ORAL | Status: DC
  Administered 2019-07-14: 2 via ORAL
  Filled 2019-07-14: qty 2

## 2019-07-14 MED FILL — DOXYCYCLINE MONOHYDRATE 100: 100 | 6 days supply | Qty: 12 | Fill #0

## 2019-07-14 MED FILL — predniSONE 10 MG TABS: 10 | 12 days supply | Qty: 33 | Fill #0

## 2019-07-14 MED FILL — FAMOTIDINE 20 MG TABS: 20 | 15 days supply | Qty: 30 | Fill #0

## 2019-07-14 NOTE — Discharge Summary (Signed)
Physician Discharge Summary  Raymond Owens YCX:[id number] DOB: [birth year] DOA: 07/06/2019  PCP: Donella Stade, PA-C  Admit date: 07/06/2019 Discharge date: 07/14/2019  Admitted From: Home Disposition:  Home  Discharge Condition:Stable CODE STATUS:FULL Diet recommendation:  Regular  Brief/Interim Summary: Patient is a 28 year old male without significant past medical history who presented with complaints of headache, fever, rash on the extremities.  Covid-19 found to be negative.  He was admitted with diagnosis of sepsis of unclear origin.  Suspected to have  meningitis versus .  Underwent LP with findings of body with.Started on broad-spectrum antibiotics with vancomycin, Rocephin, doxycycline.  Chest imaging showed atypical pneumonia, small lung nodules,no evidence of PE.  He had skin rash.Skin biopsy done and  report did not show any specific finding. ID wasfollowing.  Respiratory status has significantly improved.  I recommended to continue antibiotics: Ceftriaxone and doxycycline for total of 2 weeks.  He has a midline.  He will be discharged today to home with home health for IV antibiotics.  Following problems were addressed during his hospitalization:  Sepsis: Presented with tachypnea, tachycardia, leukocytosis.  Started on broad-spectrum antibiotics.  Suspected  meningitis.  Lumbar puncture with more than 400 WBC, PMN predominance.  Presented with photophobia, headache, typical findings of meningitis.  All  symptoms have improved.  [facility] spotted fever a possibility though IgG,IgM is negative.    He had rash on his palms, upper extremities which have improved. Has Leukocytosis. Chest imaging showed groundglass opacity, small nodules.  Autoimmune disease/ sarcoidosis also on the differential given his skin, pulmonary finding.Normal  ACE level in CSF and serum.  Histoplasma antigen negative. Started on steroids.  Has elevated ferritin, CRP, D-dimer. Underwent a skin biopsy by  general surgery for possible sarcoidosis versus vasculitis.  Biopsy did not show any specific finding. MRI of the brain did not show any acute intracranial normalities. His overall status has improved.  Infectious disease cleared him for discharge.  He will be continued with antibiotics for total of 2 weeks.  He has remained in his right arm.  Home health has been arranged. He needs to follow-up with his PCP in a week and do CBC, BMP test.  He needs to follow-up with a rheumatologist to rule out any rheumatological conditions.  Acute hypoxic respiratory failure: CT showed multifocal pneumonia like picture, groundglass, pulmonary nodules.  Respiratory status has significantly improved and currently is on room air.  Hypokalemia/hypophosphatemia: Supplemented  D-dimer: Most likely due to inflammatory status.  CT chest negative for PE.  Transaminitis: Due to sepsis.  Continue to monitor.  Hepatitis panel negative.Check LFT in a week.  Diarrhea: Resolved.  C. difficile negative   Discharge Diagnoses:  Principal Problem:   Aseptic meningitis Active Problems:   Sepsis ([place])   Fever   Headache   Rash   Bacterial meningitis   Acute respiratory failure with hypoxemia (HCC)   Abnormal liver function   Leukocytosis    Discharge Instructions  Discharge Instructions    Diet - low sodium heart healthy   Complete by: As directed    Discharge instructions   Complete by: As directed    1)Please take prescribed medications as instructed. 2)Follow up with home health. 3)Follow up with your PCP in a week. Do CBC ,BMP tests during the follow up. 4)Make an appointment with a rheumatologist through referral of your PCP. 5)Moniyot your Blood pressure.   Home infusion instructions [facility] May follow [place] Dosing Protocol; May administer Cathflo as needed to  maintain patency of vascular access device.; Flushing of vascular access device: per [facility] Protocol: 0.9% NaCl pre/post  medica...   Complete by: As directed    Instructions: May follow [place] Dosing Protocol   Instructions: May administer Cathflo as needed to maintain patency of vascular access device.   Instructions: Flushing of vascular access device: per [facility] Protocol: 0.9% NaCl pre/post medication administration and prn patency; Heparin 100 u/ml, 31m for implanted ports and Heparin 10u/ml, 581mfor all other central venous catheters.   Instructions: May follow [facility] Anaphylaxis Protocol for First Dose Administration in the home: 0.9% NaCl at 25-50 ml/hr to maintain IV access for protocol meds. Epinephrine 0.3 ml IV/IM PRN and Benadryl 25-50 IV/IM PRN s/s of anaphylaxis.   Instructions: [facility] Coordinator (RN) to assist per patient IV care needs in the home PRN.   Increase activity slowly   Complete by: As directed      Allergies as of 07/14/2019      Reactions   Penicillins Rash   Did it involve swelling of the face/tongue/throat, SOB, or low BP? No Did it involve sudden or severe rash/hives, skin peeling, or any reaction on the inside of your mouth or nose? Yes Did you need to seek medical attention at a hospital or doctor's office? No When did it last happen?unknown If all above answers are "NO", may proceed with cephalosporin use.      Medication List    STOP taking these medications   cyclobenzaprine 10 MG tablet Commonly known as: FLEXERIL   diclofenac sodium 1 % Gel Commonly known as: VOLTAREN   meloxicam 15 MG tablet Commonly known as: MOBIC     TAKE these medications   acetaminophen 500 MG tablet Commonly known as: TYLENOL Take 1,000 mg by mouth every 6 (six) hours as needed for mild pain, moderate pain or headache.   cefTRIAXone  IVPB Commonly known as: ROCEPHIN Inject 2 g into the vein every 12 (twelve) hours for 6 days. Indication:  meningitis Last Day of Therapy:  07/19/2019 Labs - Once weekly:  CBC/D and BMP, Labs - Every other week:  ESR and  CRP   doxycycline 100 MG tablet Commonly known as: ADOXA Take 1 tablet (100 mg total) by mouth 2 (two) times daily for 6 days.   famotidine 20 MG tablet Commonly known as: PEPCID Take 1 tablet (20 mg total) by mouth 2 (two) times daily.   ibuprofen 200 MG tablet Commonly known as: ADVIL Take 400 mg by mouth every 6 (six) hours as needed for headache, mild pain, moderate pain or cramping.   predniSONE 10 MG tablet Commonly known as: DELTASONE Take 6 pills daily for 2 days then take 4 pills daily for 3 days the  2 pills daily for 3 days then 1pill daily for 3 days then stop            Home Infusion Instuctions  (From admission, onward)         Start     Ordered   07/14/19 0000  Home infusion instructions [facility] May follow [facility] Protocol; May administer Cathflo as needed to maintain patency of vascular access device.; Flushing of vascular access device: [facility]: 0.9% NaCl pre/post medica...    Question Answer Comment  Instructions May follow [facility]antios[facility] Protocol   Instructions May administer Cathflo as needed to maintain patency of vascular access device.   Instructions Flushing of vascular access device: per [facility]:  0.9% NaCl pre/post medication administration and prn patency; Heparin 100 u/ml, 85m for implanted ports and Heparin 10u/ml, 568mfor all other central venous catheters.   Instructions May follow [facility] Anaphylaxis Protocol for First Dose Administration in the home: 0.9% NaCl at 25-50 ml/hr to maintain IV access for protocol meds. Epinephrine 0.3 ml IV/IM PRN and Benadryl 25-50 IV/IM PRN s/s of anaphylaxis.   Instructions [facility] Infusion Coordinator (RN) to assist per patient IV care needs in the home PRN.      07/14/19 1028         Follow-up Information    Breeback, Jade L, PA-C. Schedule an appointment as soon as possible for a visit in 1 week(s).   Specialty: Family Medicine Contact information: [address] [phone]        Allergies  Allergen Reactions  . Penicillins Rash    Did it involve swelling of the face/tongue/throat, SOB, or low BP? No Did it involve sudden or severe rash/hives, skin peeling, or any reaction on the inside of your mouth or nose? Yes Did you need to seek medical attention at a hospital or doctor's office? No When did it last happen?unknown If all above answers are "NO", may proceed with cephalosporin use.     Consultations:  ID   Procedures/Studies: Dg Chest 1 View  Result Date: 07/07/2019 CLINICAL DATA:  Fever, COVID positive EXAM: CHEST  1 VIEW COMPARISON:  July 06, 2019 FINDINGS: There is consolidation at the right lung base. Possible retrocardiac opacity as well. Mild interstitial prominence. No significant pleural effusion. Heart size is likely within normal limits for portable technique. IMPRESSION: Consolidation at the right lung base suspicious for pneumonia. Possible retrocardiac opacity as well Electronically Signed   By: PrMacy Mis.D.   On: 07/07/2019 10:07   Ct Head Wo Contrast  Result Date: 07/06/2019 CLINICAL DATA:  Altered mental status EXAM: CT HEAD WITHOUT CONTRAST TECHNIQUE: Contiguous axial images were obtained from the base of the skull through the vertex without intravenous contrast. COMPARISON:  None. FINDINGS: Brain: No evidence of acute infarction, hemorrhage, hydrocephalus, extra-axial collection or mass lesion/mass effect. Vascular: No hyperdense vessel or unexpected calcification. Skull: Normal. Negative for fracture or focal lesion. Sinuses/Orbits: No acute finding. Other: None. IMPRESSION: No acute intracranial pathology. Electronically Signed   By: AlEddie Candle.D.   On: 07/06/2019 17:28   Ct Chest Wo Contrast  Result Date: 07/06/2019 CLINICAL DATA:  Chest pain. Fever and shortness of breath. Abnormal lung base findings on abdominal CT. EXAM: CT CHEST WITHOUT  CONTRAST TECHNIQUE: Multidetector CT imaging of the chest was performed following the standard protocol without IV contrast. COMPARISON:  Chest radiograph earlier today. Lung bases from abdominal CT earlier today. FINDINGS: Cardiovascular: Thoracic aorta is normal in caliber. No pericardial effusion. Upper normal heart size. Mediastinum/Nodes: Small mediastinal lymph nodes not enlarged by size criteria. Limited assessment for hilar adenopathy given lack of IV contrast. The esophagus is decompressed. No visualized thyroid nodule. Lungs/Pleura: Mild smooth septal thickening. Scattered ill-defined small pulmonary nodules and ground-glass opacities in both lungs, slight dependent and lower lobe predominant. No cavitary component Fissural thickening/fluid in the fissure with trace bilateral pleural effusions. Trachea and mainstem bronchi are patent. Mild bronchial thickening in the lower lobes. Upper Abdomen: Hepatic steatosis. Assessed in full on dedicated abdominal CT earlier this day. Musculoskeletal: There are no acute or suspicious osseous abnormalities. IMPRESSION: 1. Mild septal thickening suspicious for pulmonary edema.  Trace bilateral pleural effusions. 2. Scattered ill-defined tiny pulmonary nodules in conjunction with ground-glass opacities in both lungs. Favor atypical infectious or other inflammatory etiology, (not classic for COVID-19 pneumonia). Ground-glass opacity component may represent either edema or atypical infection. Consider follow-up CT after course of treatment to ensure resolution. . Electronically Signed   By: Keith Rake M.D.   On: 07/06/2019 19:33   Ct Angio Chest Pe W Or Wo Contrast  Result Date: 07/08/2019 CLINICAL DATA:  Elevated D-dimer, hypoxia, tachycardia EXAM: CT ANGIOGRAPHY CHEST WITH CONTRAST TECHNIQUE: Multidetector CT imaging of the chest was performed using the standard protocol during bolus administration of intravenous contrast. Multiplanar CT image reconstructions  and MIPs were obtained to evaluate the vascular anatomy. CONTRAST:  195m OMNIPAQUE IOHEXOL 350 MG/ML SOLN COMPARISON:  07/06/2019 FINDINGS: Cardiovascular: Examination is somewhat limited by breath motion artifact, particularly in the bilateral lower lobes. Within this limitation, there is no evidence of pulmonary embolism through the segmental pulmonary arterial level. Mild cardiomegaly. Trace pericardial effusion. Mediastinum/Nodes: No enlarged mediastinal, hilar, or axillary lymph nodes. Thyroid gland, trachea, and esophagus demonstrate no significant findings. Lungs/Pleura: Extensive bilateral ground-glass and consolidative airspace opacity, which is new compared to prior examination. Diffuse interlobular septal thickening, similar to prior examination. Small bilateral pleural effusions, new compared to prior exam. Upper Abdomen: No acute abnormality.  Hepatic steatosis. Musculoskeletal: No chest wall abnormality. No acute or significant osseous findings. Review of the MIP images confirms the above findings. IMPRESSION: 1. Examination is somewhat limited by breath motion artifact, particularly in the bilateral lower lobes. Within this limitation, there is no evidence of pulmonary embolism through the segmental pulmonary arterial level. 2. Extensive bilateral ground-glass and consolidative airspace opacity, which is new compared to prior examination. Findings are consistent with multifocal infection. 3. Diffuse interlobular septal thickening, similar to prior examination, and consistent with pulmonary edema. 4. Small bilateral pleural effusions, new compared to prior examination. 5.  Cardiomegaly and trace pericardial effusion. Electronically Signed   By: AEddie CandleM.D.   On: 07/08/2019 16:39   Mr BJeri CosWRCContrast  Result Date: 07/11/2019 CLINICAL DATA:  Confusion. Meningitis. Fever. Patient as bilateral ground-glass nodules. Rash. EXAM: MRI HEAD WITHOUT AND WITH CONTRAST TECHNIQUE: Multiplanar,  multiecho pulse sequences of the brain and surrounding structures were obtained without and with intravenous contrast. CONTRAST:  152mGADAVIST GADOBUTROL 1 MMOL/ML IV SOLN COMPARISON:  CT head without contrast 07/06/2019 FINDINGS: Brain: No acute infarct, hemorrhage, or mass lesion is present. No significant white matter lesions are present. The ventricles are of normal size. No significant extraaxial fluid collection is present. The internal auditory canals are within normal limits. The brainstem and cerebellum are within normal limits. Postcontrast images demonstrate no pathologic enhancement. There is no dural thickening or discrete fluid collection. Vascular: Flow is present in the major intracranial arteries. Skull and upper cervical spine: The craniocervical junction is normal. Upper cervical spine is within normal limits. Marrow signal is unremarkable. Sinuses/Orbits: Mild mucosal thickening is present posteriorly in the maxillary sinuses bilaterally. This may represent a polyp or mucous retention cyst. No fluid levels are present. Mastoid air cells are clear. Globes and orbits are within normal limits. IMPRESSION: 1. Normal MRI appearance of the brain. No acute or focal lesion to explain the patient's symptoms. 2. Minimal maxillary sinus disease. Electronically Signed   By: ChSan Morelle.D.   On: 07/11/2019 14:32   Mr Lumbar Spine W Wo Contrast  Result Date: 07/07/2019 CLINICAL DATA:  Back pain, fever. EXAM:  MRI LUMBAR SPINE WITHOUT AND WITH CONTRAST TECHNIQUE: Multiplanar and multiecho pulse sequences of the lumbar spine were obtained without and with intravenous contrast. CONTRAST:  65m GADAVIST GADOBUTROL 1 MMOL/ML IV SOLN COMPARISON:  X-ray 05/25/2019 FINDINGS: Segmentation:  Standard. Alignment:  Physiologic. Vertebrae: No fracture, evidence of discitis, or bone lesion. No abnormal enhancement on postcontrast sequence. Conus medullaris and cauda equina: Conus extends to the L1-L2 level.  Conus and cauda equina appear normal. Paraspinal and other soft tissues: Negative. Disc levels: T12-L1: Unremarkable. L1-L2: Unremarkable. L2-L3: Unremarkable. L3-L4: Unremarkable. L4-L5: Unremarkable. L5-S1: Minimal degenerative endplate spurring with mild bilateral facet arthrosis resultant mild bilateral foraminal stenosis. No canal stenosis. IMPRESSION: 1. No MR evidence for discitis-osteomyelitis. 2. Mild facet arthropathy with minimal degenerative endplate spurring at LH8-N2with mild bilateral foraminal stenosis. Electronically Signed   By: NDavina PokeM.D.   On: 07/07/2019 12:18   Ct Abdomen Pelvis W Contrast  Result Date: 07/06/2019 CLINICAL DATA:  Fever, headache, nausea, vomiting, abdominal pain EXAM: CT ABDOMEN AND PELVIS WITH CONTRAST TECHNIQUE: Multidetector CT imaging of the abdomen and pelvis was performed using the standard protocol following bolus administration of intravenous contrast. CONTRAST:  1064mOMNIPAQUE IOHEXOL 300 MG/ML  SOLN COMPARISON:  None. FINDINGS: Lower chest: No acute abnormality. Scattered small pulmonary nodules in the dependent bilateral lung bases with some evidence of septal thickening. Trace bilateral pleural effusions. Hepatobiliary: No solid liver abnormality is seen. Hepatic steatosis. No gallstones, gallbladder wall thickening, or biliary dilatation. Pancreas: Unremarkable. No pancreatic ductal dilatation or surrounding inflammatory changes. Spleen: Normal in size without significant abnormality. Adrenals/Urinary Tract: Adrenal glands are unremarkable. Kidneys are normal, without renal calculi, solid lesion, or hydronephrosis. Bladder is unremarkable. Stomach/Bowel: Stomach is within normal limits. Appendix appears normal. No evidence of bowel wall thickening, distention, or inflammatory changes. Vascular/Lymphatic: No significant vascular findings are present. No enlarged abdominal or pelvic lymph nodes. Reproductive: No mass or other significant abnormality.  Other: No abdominal wall hernia or abnormality. Trace ascites in the low abdomen (series 2, image 82). Musculoskeletal: No acute or significant osseous findings. IMPRESSION: 1. No definite CT findings of the abdomen or pelvis to explain symptoms or pain. 2. Trace ascites in the low abdomen, of uncertain etiology without obvious inflammatory findings. 3.  Hepatic steatosis. 4. Scattered small pulmonary nodules in the dependent bilateral lung bases with some evidence of septal thickening. Trace bilateral pleural effusions. Findings favor atypical infection and a possible component of pulmonary edema. Electronically Signed   By: AlEddie Candle.D.   On: 07/06/2019 17:27   Dg Chest Port 1 View  Result Date: 07/06/2019 CLINICAL DATA:  Fever with headache, nausea and vomiting 4 days. EXAM: PORTABLE CHEST 1 VIEW COMPARISON:  None. FINDINGS: Lungs are adequately inflated and otherwise clear. Cardiomediastinal silhouette and remainder of the exam is within normal. IMPRESSION: No active disease. Electronically Signed   By: DaMarin Olp.D.   On: 07/06/2019 15:52   Vas Us[place]ower Extremity Venous (dvt)  Result Date: 07/08/2019  Lower Venous Study Indications: Edema, and elevated ddimer.  Performing Technologist: MeAbram SanderVS  Examination Guidelines: A complete evaluation includes B-mode imaging, spectral Doppler, color Doppler, and power Doppler as needed of all accessible portions of each vessel. Bilateral testing is considered an integral part of a complete examination. Limited examinations for reoccurring indications may be performed as noted.  +---------+---------------+---------+-----------+----------+--------------+ RIGHT    CompressibilityPhasicitySpontaneityPropertiesThrombus Aging +---------+---------------+---------+-----------+----------+--------------+ CFV      Full           Yes  Yes                                  +---------+---------------+---------+-----------+----------+--------------+ SFJ      Full                                                        +---------+---------------+---------+-----------+----------+--------------+ FV Prox  Full                                                        +---------+---------------+---------+-----------+----------+--------------+ FV Mid   Full                                                        +---------+---------------+---------+-----------+----------+--------------+ FV DistalFull                                                        +---------+---------------+---------+-----------+----------+--------------+ PFV      Full                                                        +---------+---------------+---------+-----------+----------+--------------+ POP      Full           Yes      Yes                                 +---------+---------------+---------+-----------+----------+--------------+ PTV      Full                                                        +---------+---------------+---------+-----------+----------+--------------+ PERO     Full                                                        +---------+---------------+---------+-----------+----------+--------------+   +---------+---------------+---------+-----------+----------+--------------+ LEFT     CompressibilityPhasicitySpontaneityPropertiesThrombus Aging +---------+---------------+---------+-----------+----------+--------------+ CFV      Full           Yes      Yes                                 +---------+---------------+---------+-----------+----------+--------------+ SFJ      Full                                                        +---------+---------------+---------+-----------+----------+--------------+  FV Prox  Full                                                         +---------+---------------+---------+-----------+----------+--------------+ FV Mid   Full                                                        +---------+---------------+---------+-----------+----------+--------------+ FV DistalFull                                                        +---------+---------------+---------+-----------+----------+--------------+ PFV      Full                                                        +---------+---------------+---------+-----------+----------+--------------+ POP      Full           Yes      Yes                                 +---------+---------------+---------+-----------+----------+--------------+ PTV      Full                                                        +---------+---------------+---------+-----------+----------+--------------+ PERO     Full                                                        +---------+---------------+---------+-----------+----------+--------------+     Summary: Right: There is no evidence of deep vein thrombosis in the lower extremity. No cystic structure found in the popliteal fossa. Left: There is no evidence of deep vein thrombosis in the lower extremity. No cystic structure found in the popliteal fossa.  *See table(s) above for measurements and observations. Electronically signed by Monica Martinez MD on 07/08/2019 at 3:27:25 PM.    Final    Dg Fl Guided Lumbar Puncture  Result Date: 07/07/2019 CLINICAL DATA:  Fever. EXAM: DIAGNOSTIC LUMBAR PUNCTURE UNDER FLUOROSCOPIC GUIDANCE FLUOROSCOPY TIME:  Fluoroscopy Time:  0.3 minutes Radiation Exposure Index (if provided by the fluoroscopic device): 24.80 mGy Number of Acquired Spot Images: 1 PROCEDURE: Informed consent was obtained from the patient prior to the procedure, including potential complications of headache, allergy, and pain. With the patient prone, the lower back was prepped with Betadine. 1% Lidocaine was used for local  anesthesia. Lumbar puncture was performed at the L3-L4 level using a 20 gauge needle with return of clear CSF  with an opening pressure of 9 cm water. 12 ml of CSF were obtained for laboratory studies. The patient tolerated the procedure well and there were no apparent complications. IMPRESSION: Successful L3-L4 lumbar puncture without immediate postprocedure complication. 12 mL CSF obtained for laboratory studies. Electronically Signed   By: Kellie Simmering DO   On: 07/07/2019 13:07   US Abdomen Limited Ruq  Result Date: 07/07/2019 CLINICAL DATA:  Abnormal liver function tests. EXAM: ULTRASOUND ABDOMEN LIMITED RIGHT UPPER QUADRANT COMPARISON:  July 06, 2019. FINDINGS: Gallbladder: No gallstones or wall thickening visualized. No sonographic Murphy sign noted by sonographer. Common bile duct: Diameter: 4 mm which is within normal limits. Liver: Increased echogenicity of hepatic parenchyma is noted. 6 cm low density is noted is noted within the left hepatic lobe of uncertain etiology. No corresponding abnormality is seen on the CT scan of the previous day. Portal vein is patent on color Doppler imaging with normal direction of blood flow towards the liver. Other: Small amount of perihepatic fluid is noted. IMPRESSION: Increased echogenicity of hepatic parenchyma is noted consistent with hepatic steatosis. 6 cm anechoic structure is noted within the left hepatic lobe that is not visualized on CT scan performed the previous day; etiology is unknown, but abscess cannot be excluded although unlikely. Further evaluation with repeat CT scan, or MRI if the patient can hold still and follow breathing instructions, is recommended. Electronically Signed   By: Marijo Conception M.D.   On: 07/07/2019 07:39       Subjective: Patient seen and examined the bedside this morning.  Hemodynamically stable for discharge.  Discharge Exam: Vitals:   07/14/19 0400 07/14/19 0742  BP: (!) 153/63 (!) 161/78  Pulse: 72 91   Resp: (!) 24 16  Temp: 97.9 F (36.6 C)   SpO2: 99% 100%   Vitals:   07/14/19 0147 07/14/19 0200 07/14/19 0400 07/14/19 0742  BP: (!) 166/92  (!) 153/63 (!) 161/78  Pulse:  83 72 91  Resp:  (!) 33 (!) 24 16  Temp:   97.9 F (36.6 C)   TempSrc:   Oral   SpO2:  97% 99% 100%  Weight:      Height:        General: Pt is alert, awake, not in acute distress Cardiovascular: RRR, S1/S2 +, no rubs, no gallops Respiratory: CTA bilaterally, no wheezing, no rhonchi Abdominal: Soft, NT, ND, bowel sounds + Extremities: no edema, no cyanosis    The results of significant diagnostics from this hospitalization (including imaging, microbiology, ancillary and laboratory) are listed below for reference.     Microbiology: Recent Results (from the past 240 hour(s))  Culture, blood (Routine x 2)     Status: None   Collection Time: 07/06/19  3:25 PM   Specimen: BLOOD  Result Value Ref Range Status   Specimen Description   Final    BLOOD RIGHT ANTECUBITAL Performed at [facility], [address] [place], [place] [address]    Special Requests   Final    BOTTLES DRAWN AEROBIC AND ANAEROBIC Blood Culture adequate volume Performed at [facility], [address] [place], [place] [address]    Culture   Final    NO GROWTH 5 DAYS Performed at [facility], [place] [address], [place], [place] [address]    Report Status 07/11/2019 FINAL  Final  SARS CORONAVIRUS 2 (TAT 6-24 HRS) Nasopharyngeal Nasopharyngeal Swab     Status: None   Collection  Time: 07/06/19  3:39 PM   Specimen: Nasopharyngeal Swab  Result Value Ref Range Status   SARS Coronavirus 2 NEGATIVE NEGATIVE Final    Comment: (NOTE) SARS-CoV-2 target nucleic acids are NOT DETECTED. The SARS-CoV-2 RNA is generally detectable in upper and lower respiratory specimens during the acute phase of infection. Negative results do not preclude SARS-CoV-2 infection, do not rule out co-infections with other pathogens,  and should not be used as the sole basis for treatment or other patient management decisions. Negative results must be combined with clinical observations, patient history, and epidemiological information. The expected result is Negative. Fact Sheet for Patients: SugarRoll.be Fact Sheet for Healthcare Providers: https://www.woods-mathews.com/ This test is not yet approved or cleared by the [place] FDA and  has been authorized for detection and/or diagnosis of SARS-CoV-2 by FDA under an Emergency Use Authorization (EUA). This EUA will remain  in effect (meaning this test can be used) for the duration of the COVID-19 declaration under Section 56 4(b)(1) of the Act, 21 U.S.C. section 360bbb-3(b)(1), unless the authorization is terminated or revoked sooner. Performed at [facility], [place] [address] [place], [place] [address]   Culture, blood (Routine x 2)     Status: None   Collection Time: 07/06/19  4:00 PM   Specimen: BLOOD  Result Value Ref Range Status   Specimen Description   Final    BLOOD RIGHT WRIST Performed at [facility], [address] [place], [place] [address]    Special Requests   Final    BOTTLES DRAWN AEROBIC AND ANAEROBIC Blood Culture adequate volume Performed at [facility], [address] [place], [place] [address]    Culture   Final    NO GROWTH 5 DAYS Performed at [facility], [place] [address], [place], [place] [address]    Report Status 07/11/2019 FINAL  Final  Urine culture     Status: None   Collection Time: 07/06/19  4:30 PM   Specimen: Urine, Random  Result Value Ref Range Status   Specimen Description   Final    URINE, RANDOM Performed at [facility], [address] [place], [place] [address]    Special Requests   Final    NONE Performed at [facility], [address] [place], [place] [address]    Culture   Final    NO  GROWTH Performed at [facility], [place] [address] [place], [place] [address]    Report Status 07/08/2019 FINAL  Final  Respiratory Panel by PCR     Status: None   Collection Time: 07/06/19  9:42 PM   Specimen: Nasopharyngeal Swab; Respiratory  Result Value Ref Range Status   Adenovirus NOT DETECTED NOT DETECTED Final   Coronavirus 229E NOT DETECTED NOT DETECTED Final    Comment: (NOTE) The Coronavirus on the Respiratory Panel, DOES NOT test for the novel  Coronavirus (2019 nCoV)    Coronavirus HKU1 NOT DETECTED NOT DETECTED Final   Coronavirus NL63 NOT DETECTED NOT DETECTED Final   Coronavirus OC43 NOT DETECTED NOT DETECTED Final   Metapneumovirus NOT DETECTED NOT DETECTED Final   Rhinovirus / Enterovirus NOT DETECTED NOT DETECTED Final   Influenza A NOT DETECTED NOT DETECTED Final   Influenza B NOT DETECTED NOT DETECTED Final   Parainfluenza Virus 1 NOT DETECTED NOT DETECTED Final   Parainfluenza Virus 2 NOT DETECTED NOT DETECTED Final   Parainfluenza Virus 3  NOT DETECTED NOT DETECTED Final   Parainfluenza Virus 4 NOT DETECTED NOT DETECTED Final   Respiratory Syncytial Virus NOT DETECTED NOT DETECTED Final   Bordetella pertussis NOT DETECTED NOT DETECTED Final   Chlamydophila pneumoniae NOT DETECTED NOT DETECTED Final   Mycoplasma pneumoniae NOT DETECTED NOT DETECTED Final    Comment: Performed at [facility], [place] [address], [place], [place] [address]  MRSA PCR Screening     Status: None   Collection Time: 07/07/19  3:32 AM   Specimen: Nasal Mucosa; Nasopharyngeal  Result Value Ref Range Status   MRSA by PCR NEGATIVE NEGATIVE Final    Comment:        The GeneXpert MRSA Assay (FDA approved for NASAL specimens only), is one component of a comprehensive MRSA colonization surveillance program. It is not intended to diagnose MRSA infection nor to guide or monitor treatment for MRSA infections. Performed at [facility], [place] [address]  [place], [place] [address]   CSF culture     Status: None   Collection Time: 07/07/19  1:22 PM   Specimen: PATH Cytology CSF; Cerebrospinal Fluid  Result Value Ref Range Status   Specimen Description   Final    CSF Performed at [place] [address], [place], [place] [address]    Special Requests   Final    NONE Performed at [facility], [place] [address], [place], [place] [address]    Gram Stain   Final    CYTOSPIN SMEAR WBC PRESENT,BOTH PMN AND MONONUCLEAR NO ORGANISMS SEEN Gram Stain Report Called to,Read Back By and Verified With: HEAVNER,A. RN _0  ON 11.12.2020 BY COHEN,K Performed at [facility], [place] [address], [place], [place] [address]    Culture   Final    NO GROWTH 3 DAYS Performed at [facility], [place] [address], [place], [place] [address]    Report Status 07/11/2019 FINAL  Final  C difficile quick scan w PCR reflex     Status: None   Collection Time: 07/09/19  2:30 PM   Specimen: STOOL  Result Value Ref Range Status   C Diff antigen NEGATIVE NEGATIVE Final   C Diff toxin NEGATIVE NEGATIVE Final   C Diff interpretation No C. difficile detected.  Final    Comment: Performed at [facility], [place] [address], [place], [place] [address]     Labs: BNP (last 3 results) No results for input(s): BNP in the last 8760 hours. Basic Metabolic Panel: Recent Labs  Lab 07/08/19 0258 07/09/19 0156 07/09/19 1437 07/10/19 0716 07/11/19 0341 07/12/19 0916 07/13/19 0145  NA 136 133* 135 134* 138 137 134*  K 3.2* 3.0* 3.1* 3.3* 4.2 3.4* 3.6  CL 104 102 104 102 110 105 103  CO2 _1 20* 22 22  GLUCOSE 100* 138* 124* 136* 111* 142* 103*  BUN _2 23* 22* 16  CREATININE 0.74 0.89 0.89 0.70 0.83 0.83 0.64  CALCIUM 8.0* 7.7* 7.9* 7.6* 6.8* 7.9* 7.7*  MG 1.9  --  2.1  --   --   --   --   PHOS 1.8* 1.4*  --  3.3 3.5 2.8  --    Liver Function Tests: Recent Labs  Lab  07/08/19 0258 07/09/19 0156 07/10/19 0716 07/11/19 0341 07/12/19 0916  AST  --   --  122* 166* 72*  ALT  --   --  135* 203* 228*  ALKPHOS  --   --  79 79 64  BILITOT  --   --  0.8 0.8 0.5  PROT  --   --  5.9* 5.2* 6.3*  ALBUMIN 2.4* 2.4* 2.2* 2.0* 2.5*   No results for input(s): LIPASE, AMYLASE in the last 168 hours. No results for input(s): AMMONIA in the last 168 hours. CBC: Recent Labs  Lab 07/09/19 0156 07/10/19 0716 07/11/19 1122 07/12/19 0916 07/13/19 0145 07/14/19 0731  WBC 30.6* 42.2* 34.8* 26.4* 30.1* 31.8*  NEUTROABS 26.2* 36.5*  --  20.6* 21.5* 23.3*  HGB 12.9* 12.1* 12.6* 12.8* 12.5* 12.5*  HCT 38.4* 36.2* 37.8* 39.4 38.2* 37.8*  MCV 89.5 90.5 90.6 92.3 91.2 92.2  PLT 288 315 305 381 306 436*   Cardiac Enzymes: No results for input(s): CKTOTAL, CKMB, CKMBINDEX, TROPONINI in the last 168 hours. BNP: Invalid input(s): POCBNP CBG: Recent Labs  Lab 07/07/19 1656  GLUCAP 89   D-Dimer No results for input(s): DDIMER in the last 72 hours. Hgb A1c No results for input(s): HGBA1C in the last 72 hours. Lipid Profile No results for input(s): CHOL, HDL, LDLCALC, TRIG, CHOLHDL, LDLDIRECT in the last 72 hours. Thyroid function studies No results for input(s): TSH, T4TOTAL, T3FREE, THYROIDAB in the last 72 hours.  Invalid input(s): FREET3 Anemia work up No results for input(s): VITAMINB12, FOLATE, FERRITIN, TIBC, IRON, RETICCTPCT in the last 72 hours. Urinalysis    Component Value Date/Time   COLORURINE YELLOW 07/06/2019 1630   APPEARANCEUR HAZY (A) 07/06/2019 1630   LABSPEC 1.020 07/06/2019 1630   PHURINE 6.5 07/06/2019 1630   GLUCOSEU NEGATIVE 07/06/2019 1630   HGBUR MODERATE (A) 07/06/2019 1630   BILIRUBINUR NEGATIVE 07/06/2019 1630   KETONESUR NEGATIVE 07/06/2019 1630   PROTEINUR 100 (A) 07/06/2019 1630   NITRITE NEGATIVE 07/06/2019 1630   LEUKOCYTESUR NEGATIVE 07/06/2019 1630   Sepsis Labs Invalid input(s): PROCALCITONIN,  WBC,   LACTICIDVEN Microbiology Recent Results (from the past 240 hour(s))  Culture, blood (Routine x 2)     Status: None   Collection Time: 07/06/19  3:25 PM   Specimen: BLOOD  Result Value Ref Range Status   Specimen Description   Final    BLOOD RIGHT ANTECUBITAL Performed at [facility], [address] [place], [place] [address]    Special Requests   Final    BOTTLES DRAWN AEROBIC AND ANAEROBIC Blood Culture adequate volume Performed at [facility], [address] [place], [place] [address]    Culture   Final    NO GROWTH 5 DAYS Performed at [facility], [place] [address], [place], [place] [address]    Report Status 07/11/2019 FINAL  Final  SARS CORONAVIRUS 2 (TAT 6-24 HRS) Nasopharyngeal Nasopharyngeal Swab     Status: None   Collection Time: 07/06/19  3:39 PM   Specimen: Nasopharyngeal Swab  Result Value Ref Range Status   SARS Coronavirus 2 NEGATIVE NEGATIVE Final    Comment: (NOTE) SARS-CoV-2 target nucleic acids are NOT DETECTED. The SARS-CoV-2 RNA is generally detectable in upper and lower respiratory specimens during the acute phase of infection. Negative results do not preclude SARS-CoV-2 infection, do not rule out co-infections with other pathogens, and should not be used as the sole basis for treatment or other patient management decisions. Negative results must be combined with clinical observations, patient history, and epidemiological information. The expected result is Negative. Fact Sheet for Patients: SugarRoll.be Fact Sheet for Healthcare Providers: https://www.woods-mathews.com/ This test is not  yet approved or cleared by the [place] and  has been authorized for detection and/or diagnosis of SARS-CoV-2 by FDA under an Emergency Use Authorization (EUA). This EUA will remain  in effect (meaning this test can be used) for the duration of the COVID-19 declaration under Section  56 4(b)(1) of the Act, 21 U.S.C. section 360bbb-3(b)(1), unless the authorization is terminated or revoked sooner. Performed at [facility], [place] [address] [place], [place] [address]   Culture, blood (Routine x 2)     Status: None   Collection Time: 07/06/19  4:00 PM   Specimen: BLOOD  Result Value Ref Range Status   Specimen Description   Final    BLOOD RIGHT WRIST Performed at [facility], [address] [place], [place] [address]    Special Requests   Final    BOTTLES DRAWN AEROBIC AND ANAEROBIC Blood Culture adequate volume Performed at [facility], [address] [place], [place] [address]    Culture   Final    NO GROWTH 5 DAYS Performed at [facility], [place] [address], [place], [place] [address]    Report Status 07/11/2019 FINAL  Final  Urine culture     Status: None   Collection Time: 07/06/19  4:30 PM   Specimen: Urine, Random  Result Value Ref Range Status   Specimen Description   Final    URINE, RANDOM Performed at [facility], [address] [place], [place] [address]    Special Requests   Final    NONE Performed at [facility], [address] [place], [place] [address]    Culture   Final    NO GROWTH Performed at [facility], [place] [address], [place], [place] [address]    Report Status 07/08/2019 FINAL  Final  Respiratory Panel by PCR     Status: None   Collection Time: 07/06/19  9:42 PM   Specimen: Nasopharyngeal Swab; Respiratory  Result Value Ref Range Status   Adenovirus NOT DETECTED NOT DETECTED Final   Coronavirus 229E NOT DETECTED NOT DETECTED Final    Comment: (NOTE) The Coronavirus on the Respiratory Panel, DOES NOT test for the novel  Coronavirus (2019 nCoV)    Coronavirus HKU1 NOT DETECTED NOT DETECTED Final   Coronavirus NL63 NOT DETECTED NOT DETECTED Final   Coronavirus OC43 NOT DETECTED NOT DETECTED Final   Metapneumovirus NOT DETECTED NOT DETECTED Final    Rhinovirus / Enterovirus NOT DETECTED NOT DETECTED Final   Influenza A NOT DETECTED NOT DETECTED Final   Influenza B NOT DETECTED NOT DETECTED Final   Parainfluenza Virus 1 NOT DETECTED NOT DETECTED Final   Parainfluenza Virus 2 NOT DETECTED NOT DETECTED Final   Parainfluenza Virus 3 NOT DETECTED NOT DETECTED Final   Parainfluenza Virus 4 NOT DETECTED NOT DETECTED Final   Respiratory Syncytial Virus NOT DETECTED NOT DETECTED Final   Bordetella pertussis NOT DETECTED NOT DETECTED Final   Chlamydophila pneumoniae NOT DETECTED NOT DETECTED Final   Mycoplasma pneumoniae NOT DETECTED NOT DETECTED Final    Comment: Performed at [facility], [place], [place], [place] [address]  MRSA PCR Screening     Status: None   Collection Time: 07/07/19  3:32 AM   Specimen: Nasal Mucosa; Nasopharyngeal  Result Value Ref Range Status   MRSA by PCR NEGATIVE NEGATIVE Final    Comment:  The GeneXpert MRSA Assay (FDA approved for NASAL specimens only), is one component of a comprehensive MRSA colonization surveillance program. It is not intended to diagnose MRSA infection nor to guide or monitor treatment for MRSA infections. Performed at [facility], [place] [address], [place], [place] [address]   CSF culture     Status: None   Collection Time: 07/07/19  1:22 PM   Specimen: PATH Cytology CSF; Cerebrospinal Fluid  Result Value Ref Range Status   Specimen Description   Final    CSF Performed at [place] [address] [place], [place] [address]    Special Requests   Final    NONE Performed at [facility], [place] [address] [place], [place] [address]    Gram Stain   Final    CYTOSPIN SMEAR WBC PRESENT,BOTH PMN AND MONONUCLEAR NO ORGANISMS SEEN Gram Stain Report Called to,Read Back By and Verified With: HEAVNER,A. RN _0  ON 11.12.2020 BY COHEN,K Performed at [facility], [place] [address],  [place], [place] [address]    Culture   Final    NO GROWTH 3 DAYS Performed at [facility], [place] [address], [place], [place] [address]    Report Status 07/11/2019 FINAL  Final  C difficile quick scan w PCR reflex     Status: None   Collection Time: 07/09/19  2:30 PM   Specimen: STOOL  Result Value Ref Range Status   C Diff antigen NEGATIVE NEGATIVE Final   C Diff toxin NEGATIVE NEGATIVE Final   C Diff interpretation No C. difficile detected.  Final    Comment: Performed at [facility], [place] [address] [place], [place] [address]    Please note: You were cared for by a hospitalist during your hospital stay. Once you are discharged, your primary care physician will handle any further medical issues. Please note that NO REFILLS for any discharge medications will be authorized once you are discharged, as it is imperative that you return to your primary care physician (or establish a relationship with a primary care physician if you do not have one) for your post hospital discharge needs so that they can reassess your need for medications and monitor your lab values.    Time coordinating discharge: 40 minutes  SIGNED:   Shelly Coss, MD  [facility] 07/14/2019, 10:34 AM Pager [phone]  If 7PM-7AM, please contact night-coverage [email or web address] Password [email or web address]

## 2019-07-14 NOTE — Progress Notes (Signed)
Pam, with IV coordination, has reviewed midline care instructions and IV medication administration with patient and his wife at bedside. All questions have been answered by RN at this time. All prescriptions have been sent to [facility] outpatient pharmacy by Dr. Tawanna Solo. AVS reviewed with patient and his family. All belongings have been sent home with patient and his wife. Pt taken down to vehicle by NT.

## 2019-07-14 NOTE — TOC Initial Note (Signed)
Transition of Care [facility]) - Initial/Assessment Note    Patient Details  Name: Raymond Owens MRN: [id number] Date of Birth: [birth year]  [facility] CM/SW Contact:    Nelwyn Salisbury, LCSW Phone Number: [phone] 07/14/2019, 11:08 AM  Clinical Narrative:          Pt admitted with sepsis, suspected meningitis, from home where he resides with his wife and children. Pt discharging home today, has midline, to receive IV ceftriaxone until 07/20/19. Discussed home health needs with pt's wife and referred to [facility] for infusion. Pt's wife coming to hospital around 12:30 today to finalize care needs and teaching, preparing to take pt home. She is in medical field and reports having strong support of friends and family to manage pt care.  Expected Discharge Plan: Home w Home Health Services Barriers to Discharge: No Barriers Identified   Patient Goals and CMS Choice Patient states their goals for this hospitalization and ongoing recovery are:: going home today CMS Medicare.gov Compare Post Acute Care list provided to:: Patient Choice offered to / list presented to : Patient, Spouse  Expected Discharge Plan and Services Expected Discharge Plan: Home w Home Health Services   Discharge Planning Services: CM Consult Post Acute Care Choice: Home Health Living arrangements for the past 2 months: Single Family Home Expected Discharge Date: 07/14/19                         HH Arranged: IV Antibiotics HH Agency: [facility] Date HH Agency Contacted: 07/14/19 Time HH Agency Contacted: 1107 Representative spoke with at [facility] Agency: Pam  Prior Living Arrangements/Services Living arrangements for the past 2 months: Single Family Home Lives with:: Minor Children, Spouse Patient language and need for interpreter reviewed:: Yes Do you feel safe going back to the place where you live?: Yes      Need for Family Participation in Patient Care: Yes (Comment)(wife) Care giver support system in  place?: Yes (comment)   Criminal Activity/Legal Involvement Pertinent to Current Situation/Hospitalization: No - Comment as needed  Activities of Daily Living Home Assistive Devices/Equipment: None ADL Screening (condition at time of admission) Patient's cognitive ability adequate to safely complete daily activities?: Yes(very weak) Is the patient deaf or have difficulty hearing?: No Does the patient have difficulty seeing, even when wearing glasses/contacts?: No Does the patient have difficulty concentrating, remembering, or making decisions?: No Patient able to express need for assistance with ADLs?: Yes Does the patient have difficulty dressing or bathing?: Yes(secondary to weakness) Independently performs ADLs?: No Communication: Independent Dressing (OT): Needs assistance Is this a change from baseline?: Change from baseline, expected to last >3 days Grooming: Needs assistance Is this a change from baseline?: Change from baseline, expected to last >3 days Feeding: Needs assistance Is this a change from baseline?: Change from baseline, expected to last >3 days Bathing: Needs assistance Is this a change from baseline?: Change from baseline, expected to last >3 days Toileting: Needs assistance Is this a change from baseline?: Change from baseline, expected to last >3days In/Out Bed: Needs assistance Is this a change from baseline?: Change from baseline, expected to last >3 days Walks in Home: Needs assistance Is this a change from baseline?: Change from baseline, expected to last >3 days Does the patient have difficulty walking or climbing stairs?: Yes(secondary to weakness) Weakness of Legs: Both Weakness of Arms/Hands: None  Permission Sought/Granted Permission sought to share information with : Family Supports Permission granted to share information with :  Yes, Verbal Permission Granted  Share Information with NAME: [phone] wife Ramond Craver           Emotional  Assessment Appearance:: Appears stated age            Admission diagnosis:  Fever [R50.9] Patient Active Problem List   Diagnosis Date Noted  . Abnormal liver function   . Aseptic meningitis   . Leukocytosis   . Bacterial meningitis 07/08/2019  . Acute respiratory failure with hypoxemia ([place]) 07/08/2019  . Fever 07/07/2019  . Headache 07/07/2019  . Rash 07/07/2019  . Sepsis ([place]) 07/06/2019  . Chronic heel pain, left 05/20/2018  . Hypertriglyceridemia 06/30/2016  . Ringworm 06/29/2016   PCP:  Donella Stade, PA-C Pharmacy:   [facility], [address] [place] [address] [place] [address] Phone: [phone] Fax: [phone]     Social Determinants of Health (SDOH) Interventions    Readmission Risk Interventions No flowsheet data found.

## 2019-07-14 NOTE — Plan of Care (Signed)
Patient and his wife are ready for discharge; all current questions answered by RN.

## 2019-07-14 NOTE — Progress Notes (Signed)
Subjective:  Insomnia last night   Antibiotics:  Anti-infectives (From admission, onward)   Start     Dose/Rate Route Frequency Ordered Stop   07/14/19 0000  cefTRIAXone (ROCEPHIN) IVPB     2 g Intravenous Every 12 hours 07/14/19 1028 07/20/19 2359   07/14/19 0000  doxycycline (ADOXA) 100 MG tablet  Status:  Discontinued     100 mg Oral 2 times daily 07/14/19 1028 07/14/19    07/14/19 0000  doxycycline (ADOXA) 100 MG tablet     100 mg Oral 2 times daily 07/14/19 1306 07/20/19 2359   07/10/19 1200  vancomycin (VANCOCIN) 1,500 mg in sodium chloride 0.9 % 500 mL IVPB  Status:  Discontinued     1,500 mg 250 mL/hr over 120 Minutes Intravenous Every 8 hours 07/10/19 1156 07/12/19 1052   07/07/19 1600  vancomycin (VANCOCIN) 1,750 mg in sodium chloride 0.9 % 500 mL IVPB  Status:  Discontinued     1,750 mg 250 mL/hr over 120 Minutes Intravenous Every 12 hours 07/07/19 0605 07/07/19 1120   07/07/19 1600  cefTRIAXone (ROCEPHIN) 2 g in sodium chloride 0.9 % 100 mL IVPB     2 g 200 mL/hr over 30 Minutes Intravenous Every 12 hours 07/07/19 1108     07/07/19 1200  vancomycin (VANCOCIN) IVPB 1000 mg/200 mL premix  Status:  Discontinued     1,000 mg 200 mL/hr over 60 Minutes Intravenous Every 8 hours 07/07/19 1120 07/10/19 1153   07/07/19 0445  doxycycline (VIBRAMYCIN) 100 mg in sodium chloride 0.9 % 250 mL IVPB     100 mg 125 mL/hr over 120 Minutes Intravenous Every 12 hours 07/07/19 0440 07/16/19 2359   07/07/19 0400  vancomycin (VANCOCIN) 1,750 mg in sodium chloride 0.9 % 500 mL IVPB  Status:  Discontinued     1,750 mg 250 mL/hr over 120 Minutes Intravenous Every 8 hours 07/06/19 1605 07/06/19 2122   07/07/19 0200  vancomycin (VANCOCIN) 1,500 mg in sodium chloride 0.9 % 500 mL IVPB  Status:  Discontinued     1,500 mg 250 mL/hr over 120 Minutes Intravenous Every 8 hours 07/06/19 2122 07/07/19 0605   07/07/19 0000  ceFEPIme (MAXIPIME) 2 g in sodium chloride 0.9 % 100 mL IVPB  Status:   Discontinued     2 g 200 mL/hr over 30 Minutes Intravenous Every 8 hours 07/06/19 1606 07/07/19 1108   07/06/19 2230  acyclovir (ZOVIRAX) 750 mg in dextrose 5 % 150 mL IVPB  Status:  Discontinued     750 mg 165 mL/hr over 60 Minutes Intravenous Every 8 hours 07/06/19 2217 07/07/19 1108   07/06/19 1700  vancomycin (VANCOCIN) IVPB 1000 mg/200 mL premix     1,000 mg 200 mL/hr over 60 Minutes Intravenous Every 1 hr x 2 07/06/19 1603 07/06/19 1828   07/06/19 1600  ceFEPIme (MAXIPIME) 2 g in sodium chloride 0.9 % 100 mL IVPB     2 g 200 mL/hr over 30 Minutes Intravenous  Once 07/06/19 1547 07/06/19 1644   07/06/19 1600  metroNIDAZOLE (FLAGYL) IVPB 500 mg     500 mg 100 mL/hr over 60 Minutes Intravenous  Once 07/06/19 1547 07/06/19 1936   07/06/19 1600  vancomycin (VANCOCIN) IVPB 1000 mg/200 mL premix  Status:  Discontinued     1,000 mg 200 mL/hr over 60 Minutes Intravenous  Once 07/06/19 1547 07/06/19 1603      Medications: Scheduled Meds: . chlorhexidine  15 mL Mouth Rinse BID  .  Chlorhexidine Gluconate Cloth  6 each Topical Daily  . famotidine  20 mg Oral BID  . furosemide  40 mg Intravenous Once  . Gerhardt's butt cream  1 application Topical TID  . heparin injection (subcutaneous)  5,000 Units Subcutaneous Q8H  . mouth rinse  15 mL Mouth Rinse q12n4p  . predniSONE  60 mg Oral Daily  . sodium chloride flush  10-40 mL Intracatheter Q12H   Continuous Infusions: . sodium chloride Stopped (07/07/19 0438)  . sodium chloride 500 mL (07/13/19 1606)  . cefTRIAXone (ROCEPHIN)  IV Stopped (07/14/19 0455)  . doxycycline (VIBRAMYCIN) IV Stopped (07/14/19 0800)  . sodium chloride     PRN Meds:.sodium chloride, sodium chloride, acetaminophen **OR** acetaminophen, hydrALAZINE, lidocaine, loperamide, ondansetron (ZOFRAN) IV, simethicone, sodium chloride flush    Objective: Weight change:   Intake/Output Summary (Last 24 hours) at 07/14/2019 1342 Last data filed at 07/14/2019 1212 Gross  per 24 hour  Intake 1177 ml  Output 900 ml  Net 277 ml   Blood pressure (!) 161/78, pulse (!) 109, temperature 97.6 F (36.4 C), temperature source Oral, resp. rate (!) 22, height 5\' 11"  (1.803 m), weight 115.1 kg, SpO2 100 %. Temp:  [97.6 F (36.4 C)-98.4 F (36.9 C)] 97.6 F (36.4 C) (11/19 1200) Pulse Rate:  [72-109] 109 (11/19 1200) Resp:  [16-34] 22 (11/19 1200) BP: (131-166)/(56-92) 161/78 (11/19 0742) SpO2:  [97 %-100 %] 100 % (11/19 1200)  Physical Exam: General: Alert and awake, oriented x3, not in any acute distress,  CV RR Pulm NAD Rash is improving Neuro: nonfocal  CBC:  07/08/2019:     07/11/2019:       Rash is resolving   BMET Recent Labs    07/12/19 0916 07/13/19 0145  NA 137 134*  K 3.4* 3.6  CL 105 103  CO2 22 22  GLUCOSE 142* 103*  BUN 22* 16  CREATININE 0.83 0.64  CALCIUM 7.9* 7.7*     Liver Panel  Recent Labs    07/12/19 0916  PROT 6.3*  ALBUMIN 2.5*  AST 72*  ALT 228*  ALKPHOS 64  BILITOT 0.5       Sedimentation Rate No results for input(s): ESRSEDRATE in the last 72 hours. C-Reactive Protein Recent Labs    07/13/19 0145 07/14/19 0731  CRP 4.9* 2.6*    Micro Results: Recent Results (from the past 720 hour(s))  Culture, blood (Routine x 2)     Status: None   Collection Time: 07/06/19  3:25 PM   Specimen: BLOOD  Result Value Ref Range Status   Specimen Description   Final    BLOOD RIGHT ANTECUBITAL Performed at [facility], [address] [place], [place]    Special Requests   Final    BOTTLES DRAWN AEROBIC AND ANAEROBIC Blood Culture adequate volume Performed at [facility], [address] [place], [place]    Culture   Final    NO GROWTH 5 DAYS Performed at [facility], [address] [place], [place]    Report Status 07/11/2019 FINAL  Final  SARS CORONAVIRUS 2 (TAT 6-24 HRS) Nasopharyngeal Nasopharyngeal Swab     Status: None    Collection Time: 07/06/19  3:39 PM   Specimen: Nasopharyngeal Swab  Result Value Ref Range Status   SARS Coronavirus 2 NEGATIVE NEGATIVE Final    Comment: (NOTE) SARS-CoV-2 target nucleic acids are NOT DETECTED. The SARS-CoV-2 RNA is generally detectable in upper  and lower respiratory specimens during the acute phase of infection. Negative results do not preclude SARS-CoV-2 infection, do not rule out co-infections with other pathogens, and should not be used as the sole basis for treatment or other patient management decisions. Negative results must be combined with clinical observations, patient history, and epidemiological information. The expected result is Negative. Fact Sheet for Patients: SugarRoll.be Fact Sheet for Healthcare Providers: https://www.woods-mathews.com/ This test is not yet approved or cleared by the [place] FDA and  has been authorized for detection and/or diagnosis of SARS-CoV-2 by FDA under an Emergency Use Authorization (EUA). This EUA will remain  in effect (meaning this test can be used) for the duration of the COVID-19 declaration under Section 56 4(b)(1) of the Act, 21 U.S.C. section 360bbb-3(b)(1), unless the authorization is terminated or revoked sooner. Performed at [facility], [place] [address], [place], [place] [address]   Culture, blood (Routine x 2)     Status: None   Collection Time: 07/06/19  4:00 PM   Specimen: BLOOD  Result Value Ref Range Status   Specimen Description   Final    BLOOD RIGHT WRIST Performed at [facility], [address] [place], [place] [address]    Special Requests   Final    BOTTLES DRAWN AEROBIC AND ANAEROBIC Blood Culture adequate volume Performed at [facility], [address] [place], [place] [address]    Culture   Final    NO GROWTH 5 DAYS Performed at [facility], [place] [address], [place], [place] [address]    Report Status  07/11/2019 FINAL  Final  Urine culture     Status: None   Collection Time: 07/06/19  4:30 PM   Specimen: Urine, Random  Result Value Ref Range Status   Specimen Description   Final    URINE, RANDOM Performed at [facility], [address] [place], [place] [address]    Special Requests   Final    NONE Performed at [facility], [address] [place], [place] [address]    Culture   Final    NO GROWTH Performed at [facility], [place] [address] [place], [place] [address]    Report Status 07/08/2019 FINAL  Final  Respiratory Panel by PCR     Status: None   Collection Time: 07/06/19  9:42 PM   Specimen: Nasopharyngeal Swab; Respiratory  Result Value Ref Range Status   Adenovirus NOT DETECTED NOT DETECTED Final   Coronavirus 229E NOT DETECTED NOT DETECTED Final    Comment: (NOTE) The Coronavirus on the Respiratory Panel, DOES NOT test for the novel  Coronavirus (2019 nCoV)    Coronavirus HKU1 NOT DETECTED NOT DETECTED Final   Coronavirus NL63 NOT DETECTED NOT DETECTED Final   Coronavirus OC43 NOT DETECTED NOT DETECTED Final   Metapneumovirus NOT DETECTED NOT DETECTED Final   Rhinovirus / Enterovirus NOT DETECTED NOT DETECTED Final   Influenza A NOT DETECTED NOT DETECTED Final   Influenza B NOT DETECTED NOT DETECTED Final   Parainfluenza Virus 1 NOT DETECTED NOT DETECTED Final   Parainfluenza Virus 2 NOT DETECTED NOT DETECTED Final   Parainfluenza Virus 3 NOT DETECTED NOT DETECTED Final   Parainfluenza Virus 4 NOT DETECTED NOT DETECTED Final   Respiratory Syncytial Virus NOT DETECTED NOT DETECTED Final   Bordetella pertussis NOT DETECTED NOT DETECTED Final   Chlamydophila pneumoniae NOT DETECTED NOT DETECTED Final  Mycoplasma pneumoniae NOT DETECTED NOT DETECTED Final    Comment: Performed at [facility], [address] [place], [place] [address]  MRSA PCR Screening     Status: None   Collection Time: 07/07/19  3:32 AM   Specimen: Nasal  Mucosa; Nasopharyngeal  Result Value Ref Range Status   MRSA by PCR NEGATIVE NEGATIVE Final    Comment:        The GeneXpert MRSA Assay (FDA approved for NASAL specimens only), is one component of a comprehensive MRSA colonization surveillance program. It is not intended to diagnose MRSA infection nor to guide or monitor treatment for MRSA infections. Performed at [facility], [address] [place], [place] [address]   CSF culture     Status: None   Collection Time: 07/07/19  1:22 PM   Specimen: PATH Cytology CSF; Cerebrospinal Fluid  Result Value Ref Range Status   Specimen Description   Final    CSF Performed at [facility], [address] [place], [place] [address]    Special Requests   Final    NONE Performed at [facility], [address] [place], [place] [address]    Gram Stain   Final    CYTOSPIN SMEAR WBC PRESENT,BOTH PMN AND MONONUCLEAR NO ORGANISMS SEEN Gram Stain Report Called to,Read Back By and Verified With: HEAVNER,A. RN @1555  ON 11.12.2020 BY COHEN,K Performed at [facility], [address] [place], [place] [address]    Culture   Final    NO GROWTH 3 DAYS Performed at [facility], [address] [place], [place] [address]    Report Status 07/11/2019 FINAL  Final  C difficile quick scan w PCR reflex     Status: None   Collection Time: 07/09/19  2:30 PM   Specimen: STOOL  Result Value Ref Range Status   C Diff antigen NEGATIVE NEGATIVE Final   C Diff toxin NEGATIVE NEGATIVE Final   C Diff interpretation No C. difficile detected.  Final    Comment: Performed at [facility], [address] [place], [place] [address]    Studies/Results: No results found.    Assessment/Plan:  INTERVAL HISTORY:   Patient continues to feel better, he is going home today   Principal Problem:   Aseptic meningitis Active Problems:   Sepsis (HCC)    Fever   Headache   Rash   Bacterial meningitis   Acute respiratory failure with hypoxemia (HCC)   Abnormal liver function   Leukocytosis    Raymond JacobsenSteven Deavers is a 28 y.o. male withl meningitis (vs possibly RMSF) improving, rash  #1 "Aseptic meningitis" thought pt received antibiotics prior to LP x day with rash and high fevers, despite broad spectrum antibiotics. He has lung nodules as well. He is now seemingly improving on steroids. ? Sarcoid or AI pathology driving thiis Biopsy  Doesn't firm up any diagnosis for me at this point. I wonder if this could be "Still's disease." which CAN cause aseptic meningitis  I wondered about macrophage activation syndrome though I do not think he would be improving so rapidly  I added CMV and EBV serologies  I suggested discussion with Heme/Onc but they would not see reason to see pt unless his leukocytosis persists  I have arranged clinic appt for the patient with me

## 2019-07-14 NOTE — Progress Notes (Signed)
RN contacted Meghan with case management this am for consult. Per Meghan, someone will be by to speak with patient and his wife after 1300 to finalize teaching and midline care instructions. RN will be able to discharge patient after consult is complete and all education is provided. See Meghan's note from  1114. RN will continue to assess education needs.

## 2019-07-15 ENCOUNTER — Other Ambulatory Visit: Payer: Self-pay | Admitting: *Deleted

## 2019-07-15 LAB — CMV IGM: CMV IgM: <30 AU/mL (ref 0.0–29.9)

## 2019-07-15 NOTE — Patient Outreach (Signed)
[facility]) Care Management  07/15/2019  Raymond Owens [birth year] [id number]   Transition of care telephone call  Referral received:07/08/19 Initial outreach:07/15/19 Insurance: [place]   Initial unsuccessful telephone call to patient's preferred number in order to complete transition of care assessment; no answer, left HIPAA compliant voicemail message requesting return call.   Objective: Per the electronic medical record, Raymond Owens was hospitalized at [facility] from 11/11/2--07/14/19 for Aseptic Menigitis  Comorbidities include: Hyperlipidemia, former smoker    He was discharged to home on 07/14/19 with home health services for IV antibiotic via [facility], by [place].    Plan: This RNCM will route unsuccessful outreach letter with [place] Management pamphlet and 24 hour Nurse Advice Line Magnet to [place] Management clinical pool to be mailed to patient's home address. This RNCM will attempt another outreach within 4 business days.  Joylene Draft, RN, [place] Management Coordinator  [phone]- Mobile [phone]- Toll Free Main Office

## 2019-07-16 LAB — EPSTEIN-BARR VIRUS (EBV) ANTIBODY PROFILE
EBV NA IgG: <18 U/mL (ref 0.0–17.9)
EBV VCA IgG: <18 U/mL (ref 0.0–17.9)
EBV VCA IgM: <36 U/mL (ref 0.0–35.9)

## 2019-07-18 ENCOUNTER — Encounter: Payer: Self-pay | Admitting: Physician Assistant

## 2019-07-18 DIAGNOSIS — R7989 Other specified abnormal findings of blood chemistry: Secondary | ICD-10-CM

## 2019-07-18 DIAGNOSIS — R7982 Elevated C-reactive protein (CRP): Secondary | ICD-10-CM

## 2019-07-18 DIAGNOSIS — R21 Rash and other nonspecific skin eruption: Secondary | ICD-10-CM

## 2019-07-18 DIAGNOSIS — R7401 Elevation of levels of liver transaminase levels: Secondary | ICD-10-CM

## 2019-07-18 DIAGNOSIS — R918 Other nonspecific abnormal finding of lung field: Secondary | ICD-10-CM

## 2019-07-18 NOTE — Telephone Encounter (Signed)
Referral pended, please review and attach appropriate diagnosis.   Pt has in-office hosp f/u with you on 11/25

## 2019-07-19 ENCOUNTER — Inpatient Hospital Stay: Payer: 59 | Admitting: Physician Assistant

## 2019-07-20 ENCOUNTER — Encounter: Payer: Self-pay | Admitting: *Deleted

## 2019-07-20 ENCOUNTER — Encounter: Payer: Self-pay | Admitting: Physician Assistant

## 2019-07-20 ENCOUNTER — Other Ambulatory Visit: Payer: Self-pay

## 2019-07-20 ENCOUNTER — Other Ambulatory Visit: Payer: Self-pay | Admitting: *Deleted

## 2019-07-20 ENCOUNTER — Ambulatory Visit (INDEPENDENT_AMBULATORY_CARE_PROVIDER_SITE_OTHER): Payer: 59 | Admitting: Physician Assistant

## 2019-07-20 ENCOUNTER — Inpatient Hospital Stay: Payer: 59 | Admitting: Physician Assistant

## 2019-07-20 ENCOUNTER — Ambulatory Visit (INDEPENDENT_AMBULATORY_CARE_PROVIDER_SITE_OTHER): Payer: 59

## 2019-07-20 VITALS — BP 136/69 | HR 113 | Ht 71.0 in | Wt 193.0 lb

## 2019-07-20 DIAGNOSIS — R21 Rash and other nonspecific skin eruption: Secondary | ICD-10-CM | POA: Diagnosis not present

## 2019-07-20 DIAGNOSIS — A419 Sepsis, unspecified organism: Secondary | ICD-10-CM

## 2019-07-20 DIAGNOSIS — M2550 Pain in unspecified joint: Secondary | ICD-10-CM | POA: Diagnosis not present

## 2019-07-20 DIAGNOSIS — D72825 Bandemia: Secondary | ICD-10-CM

## 2019-07-20 DIAGNOSIS — J189 Pneumonia, unspecified organism: Secondary | ICD-10-CM

## 2019-07-20 IMAGING — DX DG CHEST 2V
2 series · 2 of 2 positions shown · non-contrast
Comparison: [DATE]

CLINICAL DATA: Pneumonia, follow-up

EXAM:
CHEST - 2 VIEW

[chest pa]
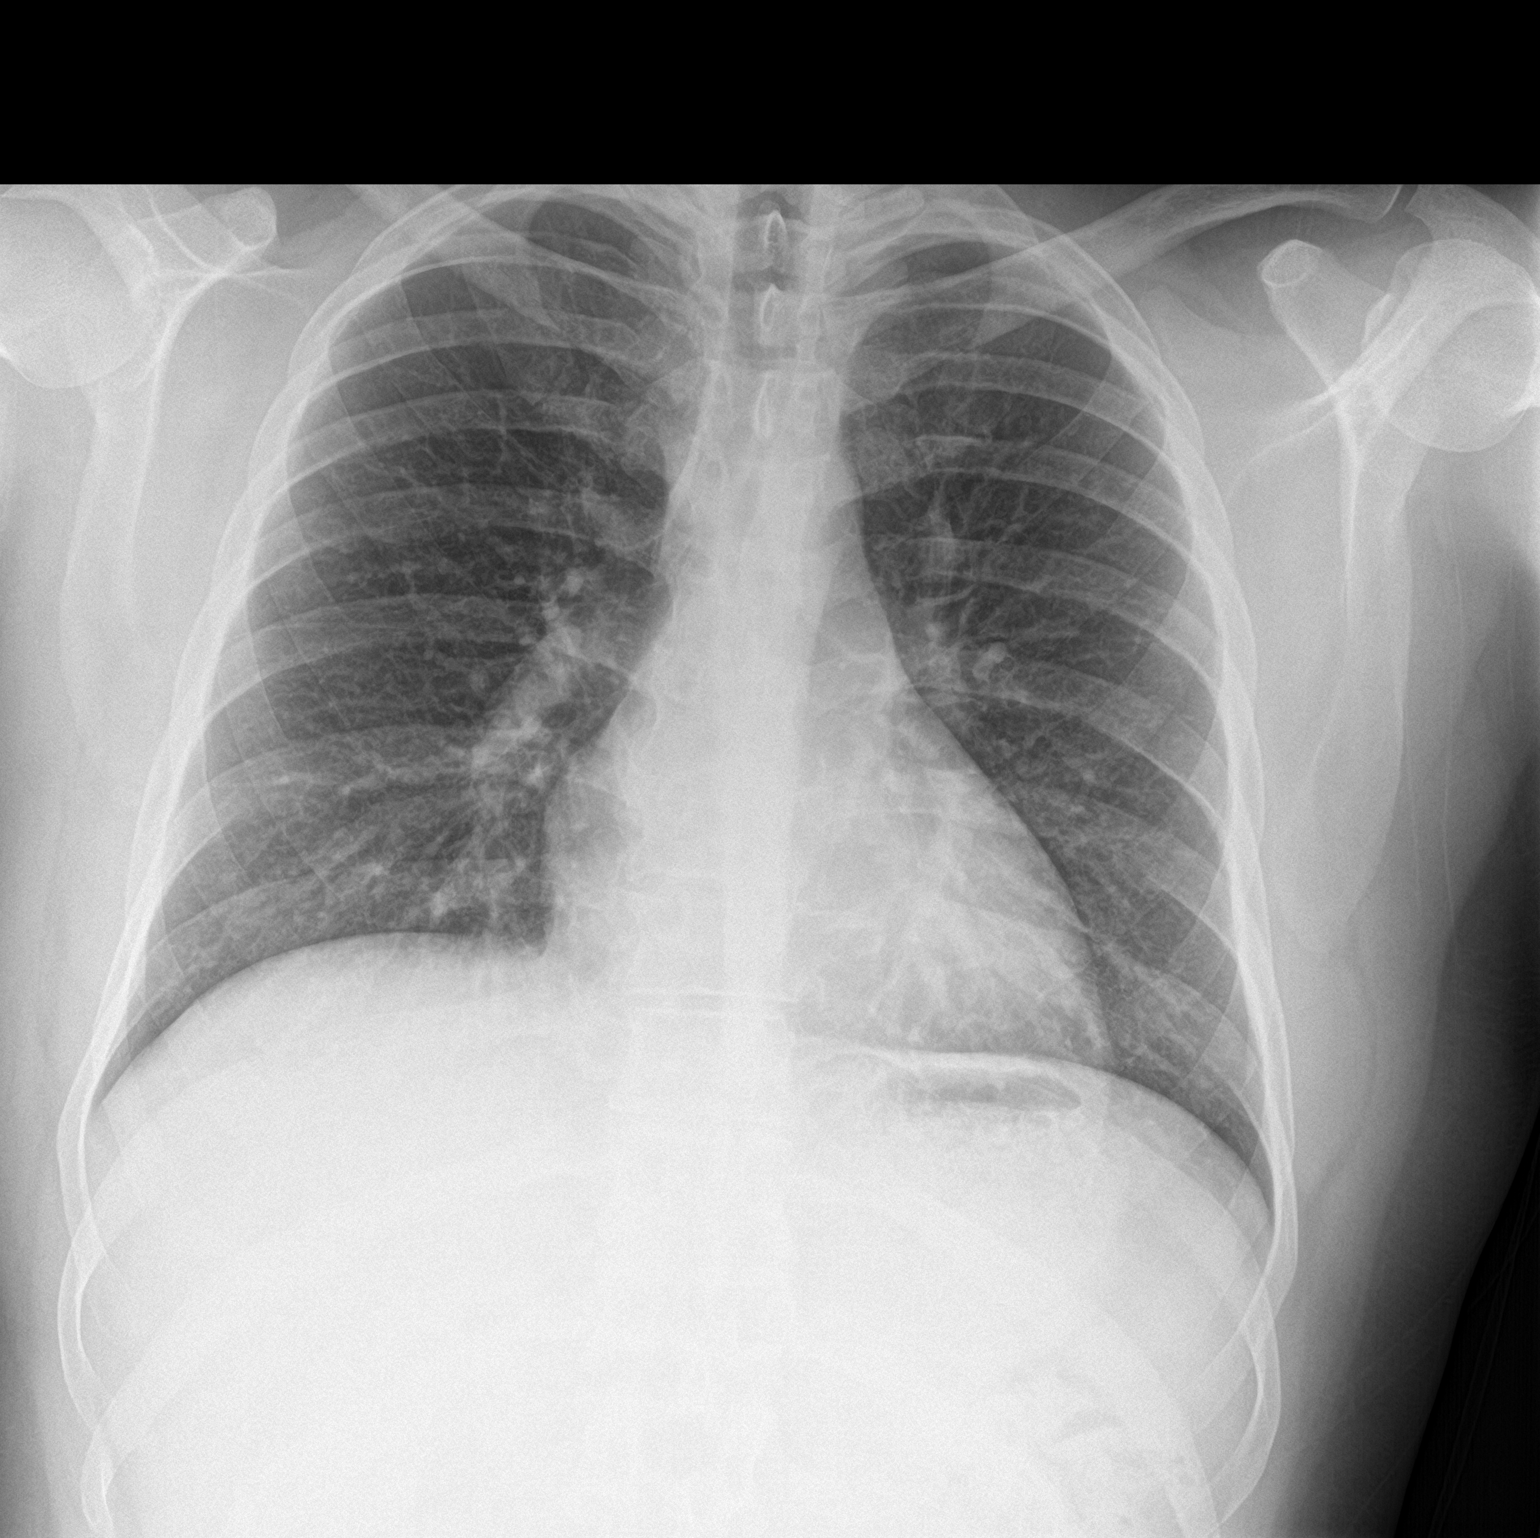

[chest lat]
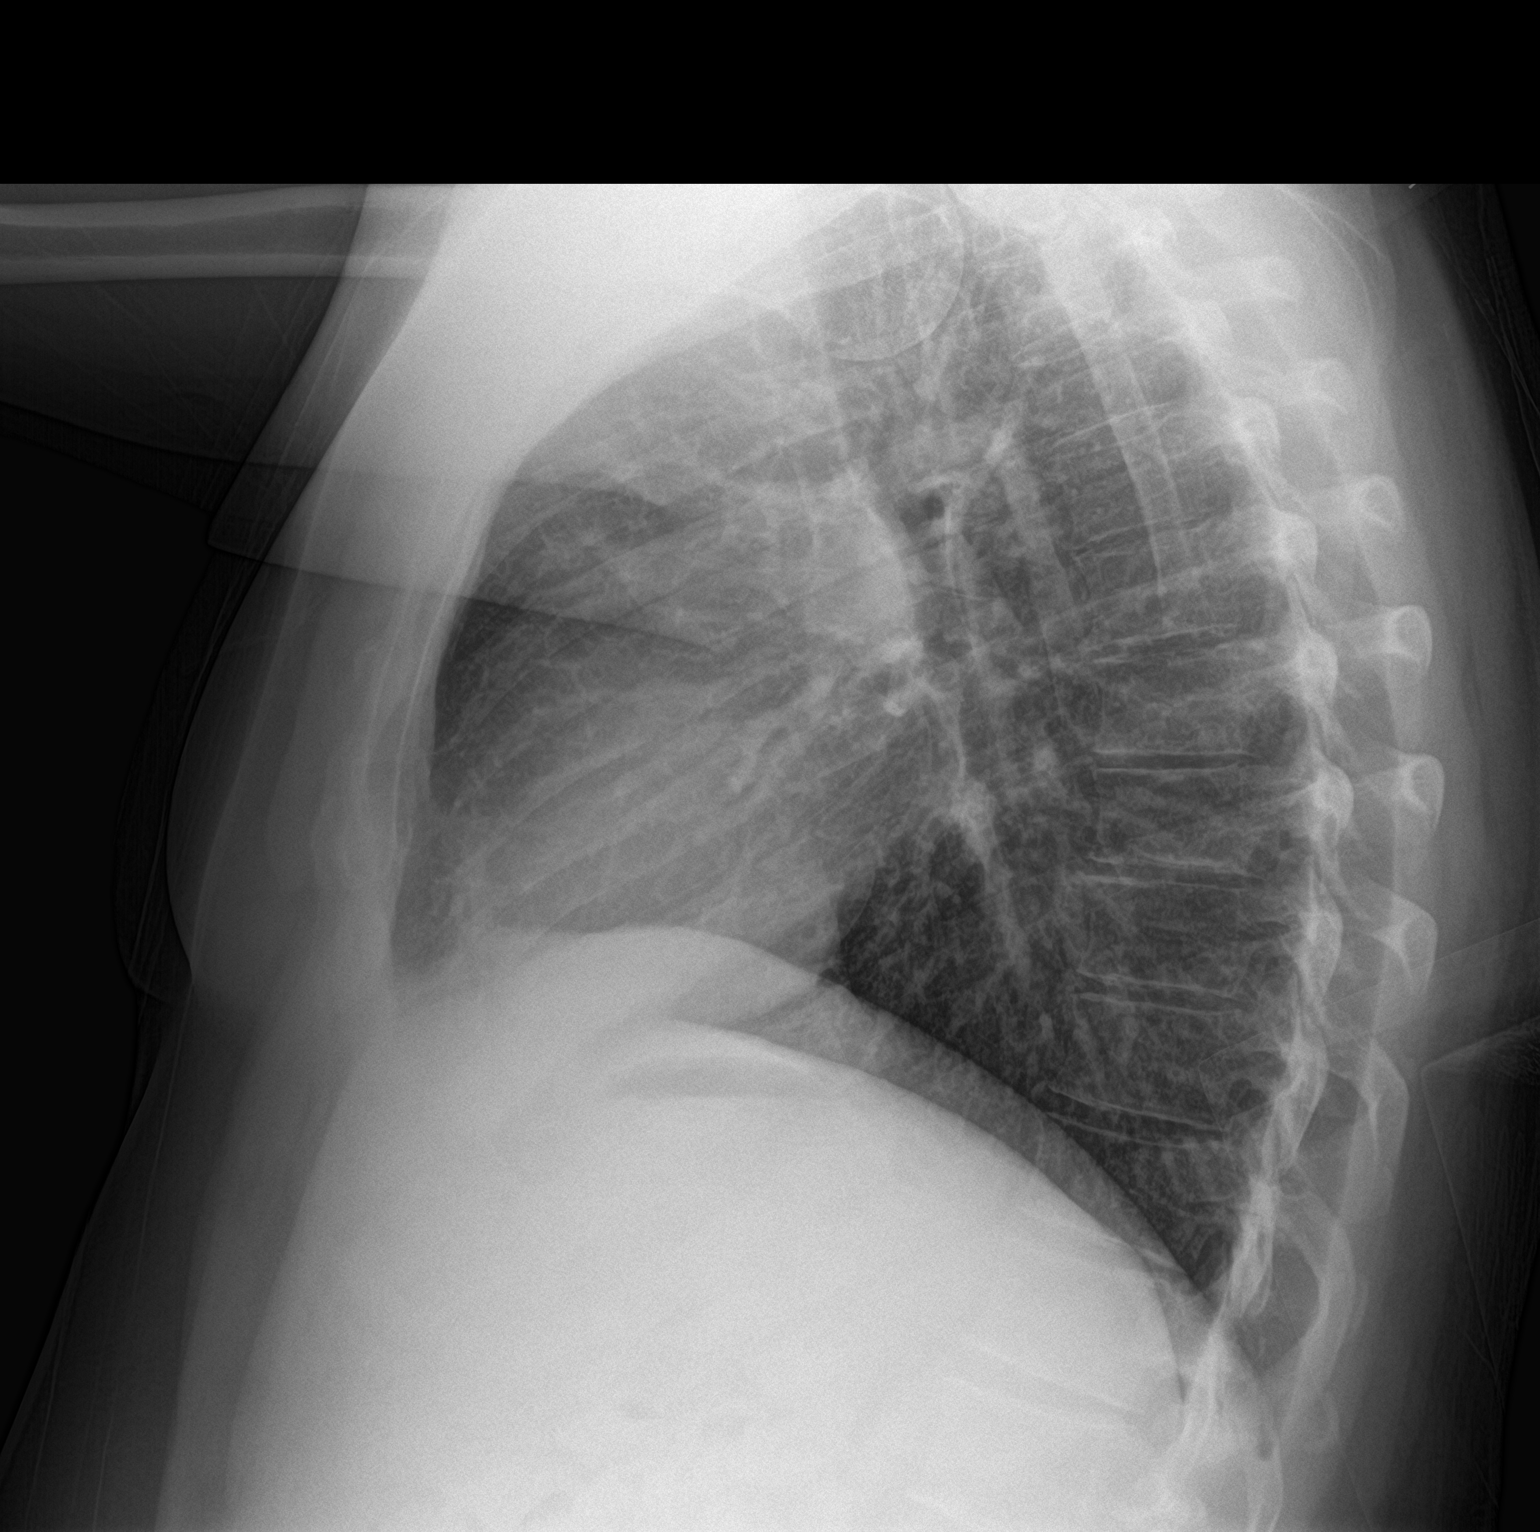

[2 of 2 positions shown; findings below may reference images not displayed]

FINDINGS: Significant improvement in lung aeration with possible minimal
residual density at the right lung base. No pleural effusion. Normal
heart size.
IMPRESSION: Significant improvement since [DATE].

## 2019-07-20 MED ORDER — PREDNISONE 10 MG PO TABS: 10.0000 mg | ORAL_TABLET | Freq: Every day | ORAL | 0 refills | Status: DC

## 2019-07-20 MED FILL — predniSONE 10 MG TABS: 10 | 15 days supply | Qty: 15 | Fill #0

## 2019-07-20 NOTE — Patient Outreach (Signed)
[facility]) Care Management  07/20/2019  Brenyn Petrey 1990-09-09 [id number]   Transition of care call/case closure   Referral received:07/08/19 Initial outreach:07/15/19 Insurance: [place] [employer]   Subjective: Initial successful telephone call to patient's preferred number in order to complete transition of care assessment; 2 HIPAA identifiers verified. Explained purpose of call and completed transition of care assessment.   States he is doing better getting stronger, he reports improvement in rash on body, arms, legs back. He denies having pain, shortness of breath or cough . He reports  tolerating diet, denies bowel or bladder problems, no diarrhea.  He reports monitoring temperature at home , no elevations, recent reading 98,  states that he has not had blood pressure monitored at home, has the equipment and wife in healthcare and know how to check it, review reasoning for follow up  He reports tolerating general activity in home, getter stronger.  He discussed having a midline catheter for IV antibiotics, that his wife was trained to do, he states last dose on yesterday and hopeful for removal at MD visit .  Spouse is  assisting with his recovery.  He denies any ongoing health issues and says she/he does not need a referral to one of the [place] chronic disease management programs.  He says he does not have the hospital indemnity He says he uses a [facility] outpatient pharmacy.  He denies educational needs related to staying safe during the COVID 19 pandemic.    Objective:  Cristie Hem was hospitalized at [facility] from 11/11/2--07/14/19 for Aseptic Menigitis  Comorbidities include: Hyperlipidemia, former smoker    He was discharged to home on 07/14/19 with IV antibiotic, administered by patient wife and  [employer] agency  for infusions   Assessment:  Patient voices good understanding of all discharge instructions.  See transition of care flowsheet for assessment  details.   Plan:  Reviewed hospital discharge diagnosis of Aseptic Meningitis   and discharge treatment plan using hospital discharge instructions, assessing medication adherence, reviewing problems requiring provider notification, and discussing the importance of follow up with surgeon, primary care provider and/or specialists as directed. Reviewed [place]'s announcements that all [place] members will receive the Healthy Lifestyle Premium rate in 2021.   No ongoing care management needs identified so will close case to [place] Management services and route successful outreach letter with [place] Management pamphlet and 24 Hour Nurse Line Magnet to [place] Management clinical pool to be mailed to patient's home address.    Joylene Draft, RN, [place] Management Coordinator  [phone]- Mobile [phone]- Toll Free Main Office

## 2019-07-20 NOTE — Progress Notes (Signed)
Discussed with patient in office

## 2019-07-20 NOTE — Progress Notes (Signed)
Subjective:    Patient ID: Raymond Owens, male    DOB: 1991-01-30, 28 y.o.   MRN: [id number]  HPI  Pt is a 28 yo male who presents to the clinic for hospital follow up.   Discharge summary.  Admit date: 07/06/2019 Discharge date: 07/14/2019  Admitted From: Home Disposition:  Home  Discharge Condition:Stable CODE STATUS:FULL Diet recommendation:  Regular  Brief/Interim Summary: Patient is a 28 year old male without significant past medical history who presented with complaints of headache, fever, rash on the extremities. Covid-19 found to be negative. He was admitted with diagnosis of sepsis of unclear origin. Suspected to have meningitis versus . Underwent LP with findings of body with.Started on broad-spectrum antibiotics with vancomycin, Rocephin, doxycycline. Chest imaging showed atypical pneumonia, small lung nodules,no evidence of PE.He had skin rash.Skin biopsydone andreport did not show any specific finding.ID wasfollowing. Respiratory status has significantly improved.  I recommended to continue antibiotics: Ceftriaxone and doxycycline for total of 2 weeks.  He has a midline.  He will be discharged today to home with home health for IV antibiotics.  Following problems were addressed during his hospitalization:  Sepsis:Presented with tachypnea, tachycardia, leukocytosis. Started on broad-spectrum antibiotics. Suspected meningitis. Lumbar puncture with more than 400 WBC, PMN predominance. Presented with photophobia, headache, typical findings of meningitis. All symptoms have improved. [facility] spotted fever a possibility though IgG,IgM is negative. He had rash on his palms, upper extremities which have improved. HasLeukocytosis. Chest imagingshowed groundglass opacity, small nodules. Autoimmune disease/ sarcoidosis also on the differential given his skin, pulmonary finding.Normal ACE level in CSF and serum. Histoplasma antigen negative. Started  on steroids. Has elevated ferritin, CRP, D-dimer. Underwent a skin biopsy by general surgery for possible sarcoidosis versus vasculitis.  Biopsy did not show any specific finding. MRI of the brain did not show any acute intracranial normalities. His overall status has improved.  Infectious disease cleared him for discharge.  He will be continued with antibiotics for total of 2 weeks.  He has remained in his right arm.  Home health has been arranged. He needs to follow-up with his PCP in a week and do CBC, BMP test.  He needs to follow-up with a rheumatologist to rule out any rheumatological conditions.  Acute hypoxic respiratory failure:CT showed multifocal pneumonia like picture, groundglass, pulmonarynodules.Respiratory status has significantly improved and currently is on room air.  Hypokalemia/hypophosphatemia:Supplemented  D-dimer:Most likely due to inflammatory status. CT chest negative for PE.  Transaminitis: Due to sepsis. Continue to monitor. Hepatitis panel negative.Check LFT in a week.  Diarrhea:Resolved. C. 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 He needs rheumatology follow up.   He is doing better. He still has midline in from hospital. He finished antibiotics today. He continues to have a lot of SOB, weakness, and fatigue. Wife is concerned about going off prednisone since that was the only thing that worked in the hospital.   .. Active Ambulatory Problems    Diagnosis Date Noted  . Ringworm 06/29/2016  . Hypertriglyceridemia 06/30/2016  . Chronic heel pain, left 05/20/2018  . Sepsis ([place]) 07/06/2019  . Fever 07/07/2019  . Headache 07/07/2019  . Rash 07/07/2019  . Bacterial meningitis 07/08/2019  . Acute respiratory failure with hypoxemia ([place]) 07/08/2019  . Abnormal liver function   . Aseptic meningitis   . Leukocytosis   . Arthralgia 07/20/2019   . Pneumonia of both lungs due to infectious organism 07/20/2019  Resolved Ambulatory Problems    Diagnosis Date Noted  . No Resolved Ambulatory Problems   Past Medical History:  Diagnosis Date  . Hyperlipidemia     Review of Systems See HPI.     Objective:   Physical Exam Vitals signs reviewed.  Constitutional:      Appearance: He is ill-appearing.  HENT:     Head: Normocephalic.  Cardiovascular:     Rate and Rhythm: Regular rhythm. Tachycardia present.     Pulses: Normal pulses.  Pulmonary:     Effort: Pulmonary effort is normal.     Breath sounds: No wheezing or rhonchi.  Skin:    Comments: Salmon pink macular rash on trunk, arms, legs. Appears to be resolving.   Midline IV in right upper arm.   Neurological:     General: No focal deficit present.     Mental Status: He is alert and oriented to person, place, and time.  Psychiatric:        Mood and Affect: Mood normal.           Assessment & Plan:  [name] KitchenMarland KitchenMalikye was seen today for follow-up.  Diagnoses and all orders for this visit:  Sepsis, due to unspecified organism, unspecified whether acute organ dysfunction present ([place]) -     CBC w/Diff/Platelet -     COMPLETE METABOLIC PANEL WITH GFR -     Sed Rate (ESR) -     CXR 2 view -     C-reactive protein -     Antinuclear Antib (ANA)  Bandemia -     CBC w/Diff/Platelet -     COMPLETE METABOLIC PANEL WITH GFR -     Sed Rate (ESR) -     CXR 2 view -     C-reactive protein -     Antinuclear Antib (ANA)  Rash -     CBC w/Diff/Platelet -     COMPLETE METABOLIC PANEL WITH GFR -     Sed Rate (ESR) -     CXR 2 view -     C-reactive protein -     Antinuclear Antib (ANA) -     predniSONE (DELTASONE) 10 MG tablet; Take 1 tablet (10 mg total) by mouth daily with breakfast.  Pneumonia of both lungs due to infectious organism, unspecified part of lung -     CBC w/Diff/Platelet -     COMPLETE METABOLIC PANEL WITH GFR -     Sed Rate (ESR) -     CXR 2 view -      C-reactive protein -     Antinuclear Antib (ANA)  Arthralgia, unspecified joint -     predniSONE (DELTASONE) 10 MG tablet; Take 1 tablet (10 mg total) by mouth daily with breakfast.   Pt appears very weak today. Vitals were elevated but improved upon rest. He was very SOB. Pulse ox was 99 percent so likely more physical deconditioning he lost 67lbs in 2 weeks.  STAT CXR showed resolving pneumonia.  Ok for midline to be removed.  STAT labs ordered.  Made urgent referral to rheumatology. If stills some concern about being off prednisone. Will send 17m maintenance dose until rheumatology appt.  appt with infectious disease on Monday.   Discussed nutrition and daily exercise. Start slow.   Written out of work for next 2 weeks.   F/U in 2 weeks.

## 2019-07-20 NOTE — Progress Notes (Signed)
Called patient. Labs moving in right direction.

## 2019-07-25 ENCOUNTER — Encounter: Payer: Self-pay | Admitting: Physician Assistant

## 2019-07-25 ENCOUNTER — Other Ambulatory Visit: Payer: Self-pay

## 2019-07-25 ENCOUNTER — Ambulatory Visit: Payer: 59 | Admitting: Infectious Disease

## 2019-07-25 ENCOUNTER — Ambulatory Visit (INDEPENDENT_AMBULATORY_CARE_PROVIDER_SITE_OTHER): Payer: 59 | Admitting: Infectious Disease

## 2019-07-25 ENCOUNTER — Encounter: Payer: Self-pay | Admitting: Infectious Disease

## 2019-07-25 VITALS — BP 111/73 | HR 102 | Temp 98.0°F

## 2019-07-25 DIAGNOSIS — M130 Polyarthritis, unspecified: Secondary | ICD-10-CM | POA: Diagnosis not present

## 2019-07-25 DIAGNOSIS — D72825 Bandemia: Secondary | ICD-10-CM | POA: Diagnosis not present

## 2019-07-25 DIAGNOSIS — J189 Pneumonia, unspecified organism: Secondary | ICD-10-CM | POA: Diagnosis not present

## 2019-07-25 DIAGNOSIS — M791 Myalgia, unspecified site: Secondary | ICD-10-CM

## 2019-07-25 DIAGNOSIS — H543 Unqualified visual loss, both eyes: Secondary | ICD-10-CM

## 2019-07-25 DIAGNOSIS — R21 Rash and other nonspecific skin eruption: Secondary | ICD-10-CM

## 2019-07-25 DIAGNOSIS — R945 Abnormal results of liver function studies: Secondary | ICD-10-CM

## 2019-07-25 DIAGNOSIS — G030 Nonpyogenic meningitis: Secondary | ICD-10-CM | POA: Diagnosis not present

## 2019-07-25 HISTORY — DX: Polyarthritis, unspecified: M13.0

## 2019-07-25 HISTORY — DX: Myalgia, unspecified site: M79.10

## 2019-07-25 HISTORY — DX: Unqualified visual loss, both eyes: H54.3

## 2019-07-25 NOTE — Telephone Encounter (Signed)
Will you let rheumatology known that patient is losing health insurance after December and needs visit as soon as possible.

## 2019-07-25 NOTE — Progress Notes (Signed)
Subjective:   Chief complaint: Blurry vision and pain in ankles and feet and knees   Patient ID: Raymond Owens, male    DOB: 11/08/1990, 28 y.o.   MRN: [id number]  HPI  Raymond Owens is a 28 year old Caucasian male who is previously healthy who was admitted to [facility] long hospital with severe headaches diffuse rash fevers, having leukocytosis his transaminitis.,  Altered on chest x-ray.  He was thought to have possible bacterial meningitis and initial LP could not be performed.  Blood cultures were drawn and he was started on empiric antibiotics for bacterial meningitis.  He also was started on acyclovir which had been discontinued since I did not find that he had evidence of meningoencephalitis.  Lumbar puncture performed 24 hours into his hospital stay showed over 400 white blood cells predominantly neutrophilic.  I continued him on antibacterial antibiotics in the form of ceftriaxone and vancomycin along with doxycycline for possible possible tickborne infection  No organisms were grown from blood cultures or CSF cultures CSF studies including cryptococcal antigen and VDRL were negative.  RPR was negative and serum his HIV antibody was negative and HIV RNA quantitative was negative.  Acute titers for RMSF and Ehrlichia were also negative.  CMV and EBV titers were negative.  His ferritin was markedly elevated rising from 4 [id number] during his hospital stay.  Skin biopsy was thought to be consistent with a superficial dermatitis.  He continued to have fevers while on appropriate antibiotics and was initiated on corticosteroids for possible autoimmune phenomenon.  Fevers defervesced promptly on prednisone 60 mg daily.  In the interim he developed visual problems and now has to close one of his eyes to properly see things.  He is not yet been evaluated by ophthalmology.  Corticosteroids have been tapered down to 10 mg.  He is not experiencing severe pain in his feet ankles and knees again making me  concerned that he has an undiagnosed autoimmune condition that the steroids were treating.   I had deescalated his antibiotics and taken away vancomycin but continued ceftriaxone and doxycycline.  He is completed a 2-week course of ceftriaxone for possible pneumococcal meningitis which I doubt that he had.  He also has appropriately can completed a course of doxycycline for RMSF or Ehrlichia.  He is supposed to be called by rheumatology so that he can be seen.  I also think he clearly needs to be seen by ophthalmology.  He still has a little difficulty breathing but this is improved and follow-up chest x-ray showed improvement as well  Rash is still present on arms abdomen.  States that his headache has returned but is more mild and he feels a pressure sensation behind his eyes   Past Medical History:  Diagnosis Date  . Hyperlipidemia     No past surgical history on file.  Family History  Problem Relation Age of Onset  . Diabetes Mother   . Heart attack Maternal Uncle   . Cancer Maternal Grandmother   . Cancer Maternal Grandfather   . Cancer Paternal Grandmother   . Cancer Paternal Grandfather       Social History   Socioeconomic History  . Marital status: Married    Spouse name: Not on file  . Number of children: Not on file  . Years of education: Not on file  . Highest education level: Not on file  Occupational History  . Not on file  Social Needs  . Financial resource strain: Not on file  . Food  insecurity    Worry: Not on file    Inability: Not on file  . Transportation needs    Medical: Not on file    Non-medical: Not on file  Tobacco Use  . Smoking status: Former Smoker    Quit date: 10/10/2011    Years since quitting: 7.7  . Smokeless tobacco: Never Used  Substance and Sexual Activity  . Alcohol use: Yes  . Drug use: No  . Sexual activity: Yes    Birth control/protection: None  Lifestyle  . Physical activity    Days per week: Not on file    Minutes  per session: Not on file  . Stress: Not on file  Relationships  . Social Musician on phone: Not on file    Gets together: Not on file    Attends religious service: Not on file    Active member of club or organization: Not on file    Attends meetings of clubs or organizations: Not on file    Relationship status: Not on file  Other Topics Concern  . Not on file  Social History Narrative  . Not on file    Allergies  Allergen Reactions  . Penicillins Rash    Did it involve swelling of the face/tongue/throat, SOB, or low BP? No Did it involve sudden or severe rash/hives, skin peeling, or any reaction on the inside of your mouth or nose? Yes Did you need to seek medical attention at a hospital or doctor's office? No When did it last happen?unknown If all above answers are "NO", may proceed with cephalosporin use.      Current Outpatient Medications:  .  acetaminophen (TYLENOL) 500 MG tablet, Take 1,000 mg by mouth every 6 (six) hours as needed for mild pain, moderate pain or headache., Disp: , Rfl:  .  famotidine (PEPCID) 20 MG tablet, Take 1 tablet (20 mg total) by mouth 2 (two) times daily., Disp: 30 tablet, Rfl: 0 .  ibuprofen (ADVIL) 200 MG tablet, Take 400 mg by mouth every 6 (six) hours as needed for headache, mild pain, moderate pain or cramping., Disp: , Rfl:  .  lactobacillus acidophilus (BACID) TABS tablet, Take 2 tablets by mouth 3 (three) times daily., Disp: 180 tablet, Rfl: 0 .  predniSONE (DELTASONE) 10 MG tablet, Take 6 pills daily for 2 days then take 4 pills daily for 3 days the  2 pills daily for 3 days then 1pill daily for 3 days then stop, Disp: 33 tablet, Rfl: 0 .  predniSONE (DELTASONE) 10 MG tablet, Take 1 tablet (10 mg total) by mouth daily with breakfast., Disp: 15 tablet, Rfl: 0   Review of Systems  Constitutional: Negative for activity change, appetite change, chills, diaphoresis, fatigue, fever and unexpected weight change.  HENT:  Negative for congestion, rhinorrhea, sinus pressure, sneezing, sore throat and trouble swallowing.   Eyes: Positive for pain and visual disturbance. Negative for photophobia.  Respiratory: Positive for shortness of breath. Negative for cough, chest tightness, wheezing and stridor.   Cardiovascular: Negative for chest pain, palpitations and leg swelling.  Gastrointestinal: Negative for abdominal distention, abdominal pain, anal bleeding, blood in stool, constipation, diarrhea, nausea and vomiting.  Genitourinary: Negative for difficulty urinating, dysuria, flank pain and hematuria.  Musculoskeletal: Positive for arthralgias and myalgias. Negative for back pain, gait problem and joint swelling.  Skin: Positive for rash. Negative for color change, pallor and wound.  Neurological: Positive for headaches. Negative for dizziness, tremors, weakness and light-headedness.  Hematological: Negative for adenopathy. Does not bruise/bleed easily.  Psychiatric/Behavioral: Negative for agitation, behavioral problems, confusion, decreased concentration, dysphoric mood and sleep disturbance.       Objective:   Physical Exam Constitutional:      General: He is not in acute distress.    Appearance: Normal appearance. He is well-developed. He is not ill-appearing or diaphoretic.  HENT:     Head: Normocephalic and atraumatic.     Right Ear: Hearing and external ear normal.     Left Ear: Hearing and external ear normal.     Nose: No nasal deformity or rhinorrhea.  Eyes:     General: No scleral icterus.    Conjunctiva/sclera: Conjunctivae normal.     Right eye: Right conjunctiva is not injected.     Left eye: Left conjunctiva is not injected.     Pupils: Pupils are equal, round, and reactive to light.  Neck:     Musculoskeletal: Normal range of motion and neck supple.     Vascular: No JVD.  Cardiovascular:     Rate and Rhythm: Normal rate and regular rhythm.     Heart sounds: Normal heart sounds, S1  normal and S2 normal. No murmur. No friction rub. No gallop.   Pulmonary:     Effort: Pulmonary effort is normal. No respiratory distress.     Breath sounds: Normal breath sounds. No wheezing or rhonchi.  Abdominal:     General: Abdomen is flat. Bowel sounds are normal. There is no distension.     Palpations: Abdomen is soft.     Tenderness: There is no abdominal tenderness.  Musculoskeletal: Normal range of motion.        General: No tenderness, deformity or signs of injury.     Right shoulder: Normal.     Left shoulder: Normal.     Right hip: Normal.     Left hip: Normal.     Right knee: Normal.     Left knee: Normal.  Lymphadenopathy:     Head:     Right side of head: No submandibular, preauricular or posterior auricular adenopathy.     Left side of head: No submandibular, preauricular or posterior auricular adenopathy.     Cervical: No cervical adenopathy.     Right cervical: No superficial or deep cervical adenopathy.    Left cervical: No superficial or deep cervical adenopathy.  Skin:    General: Skin is warm and dry.     Coloration: Skin is not pale.     Findings: No abrasion, bruising, ecchymosis, erythema, lesion or rash.     Nails: There is no clubbing.   Neurological:     General: No focal deficit present.     Mental Status: He is alert and oriented to person, place, and time. Mental status is at baseline.     Sensory: No sensory deficit.     Coordination: Coordination normal.     Gait: Gait normal.  Psychiatric:        Attention and Perception: He is attentive.        Speech: Speech normal.        Behavior: Behavior normal. Behavior is cooperative.        Thought Content: Thought content normal.        Judgment: Judgment normal.    Rash on arms see picture 07/25/2019:           Assessment & Plan:   28 year old man with presentation of fevers aseptic meningitis transaminitis leukemoid reaction, diffuse  rash now visual problems who is fevers appear to  respond to corticosteroids rather than antibiotics.  He has completed appropriate therapy for possible bacterial meningitis and tickborne infection both of which I doubt he had.  I continue to have a strong suspicion for an underlying autoimmune pathology such as possible stills disease.  He is to see rheumatology soon.  I also put in a consult for ophthalmology.  If CBC still showing markedly elevated CBC I would think he would benefit from [facility] consult  I think he will need up titration of his corticosteroid dose.  In the interim I will recheck his CBC with differential CMP sed rate CRP serum ferritin.  I will check Ehrlichia and RMSF titers though again I doubt he had these infections.  I also check a Covid IgG  Check an RPR though I doubt he had syphilis and again he should have responded to therapy with ceftriaxone if he had that.  I also expect that his VDRL to have been positive in CSF even if we were dealing with a prozone effect in his serum.  I spent greater than 25 minutes with the patient including greater than 50% of time in face to face counsel of the patient regarding the work-up of his fever unknown origin, aseptic meningitis transaminitis pneumonia, including autoimmune work-up.

## 2019-07-25 NOTE — Telephone Encounter (Signed)
Raymond Owens    I spoke with Anderson Malta at Rheumatology and she stated they just received the office note today due to the office was closed on Thursday and Friday.  The physician is reviewing and she will get him scheduled just as soon as she gets the ok from the physician she is aware of the insurance issue. - CF

## 2019-07-26 DIAGNOSIS — H539 Unspecified visual disturbance: Secondary | ICD-10-CM | POA: Diagnosis not present

## 2019-07-26 DIAGNOSIS — M2550 Pain in unspecified joint: Secondary | ICD-10-CM | POA: Diagnosis not present

## 2019-07-26 DIAGNOSIS — Z6832 Body mass index (BMI) 32.0-32.9, adult: Secondary | ICD-10-CM | POA: Diagnosis not present

## 2019-07-26 DIAGNOSIS — E669 Obesity, unspecified: Secondary | ICD-10-CM | POA: Diagnosis not present

## 2019-07-26 DIAGNOSIS — R509 Fever, unspecified: Secondary | ICD-10-CM | POA: Diagnosis not present

## 2019-07-26 LAB — COMPLETE METABOLIC PANEL WITH GFR
AG Ratio: 1.2 (calc) (ref 1.0–2.5)
ALT: 130 U/L — ABNORMAL HIGH (ref 9–46)
AST: 29 U/L (ref 10–40)
Albumin: 4.2 g/dL (ref 3.6–5.1)
Alkaline phosphatase (APISO): 70 U/L (ref 36–130)
BUN: 21 mg/dL (ref 7–25)
CO2: 27 mmol/L (ref 20–32)
Calcium: 9.8 mg/dL (ref 8.6–10.3)
Chloride: 100 mmol/L (ref 98–110)
Creat: 0.87 mg/dL (ref 0.60–1.35)
GFR, Est African American: 137 mL/min/{1.73_m2} (ref >=60)
GFR, Est Non African American: 118 mL/min/{1.73_m2} (ref >=60)
Globulin: 3.4 g/dL (calc) (ref 1.9–3.7)
Glucose, Bld: 98 mg/dL (ref 65–99)
Potassium: 5.3 mmol/L (ref 3.5–5.3)
Sodium: 137 mmol/L (ref 135–146)
Total Bilirubin: 0.5 mg/dL (ref 0.2–1.2)
Total Protein: 7.6 g/dL (ref 6.1–8.1)

## 2019-07-26 LAB — ANA: Anti Nuclear Antibody (ANA): NEGATIVE

## 2019-07-26 LAB — CBC WITH DIFFERENTIAL/PLATELET
Absolute Monocytes: 1036 cells/uL — ABNORMAL HIGH (ref 200–950)
Basophils Absolute: 130 cells/uL (ref 0–200)
Basophils Relative: 0.5 %
Eosinophils Absolute: 0 cells/uL — ABNORMAL LOW (ref 15–500)
Eosinophils Relative: 0 %
HCT: 44 % (ref 38.5–50.0)
Hemoglobin: 14.7 g/dL (ref 13.2–17.1)
Lymphs Abs: 1554 cells/uL (ref 850–3900)
MCH: 30 pg (ref 27.0–33.0)
MCHC: 33.4 g/dL (ref 32.0–36.0)
MCV: 89.8 fL (ref 80.0–100.0)
MPV: 9.9 fL (ref 7.5–12.5)
Monocytes Relative: 4 %
Neutro Abs: 23181 cells/uL — ABNORMAL HIGH (ref 1500–7800)
Neutrophils Relative %: 89.5 %
Platelets: 718 10*3/uL — ABNORMAL HIGH (ref 140–400)
RBC: 4.9 10*6/uL (ref 4.20–5.80)
RDW: 13.8 % (ref 11.0–15.0)
Total Lymphocyte: 6 %
WBC: 25.9 10*3/uL — ABNORMAL HIGH (ref 3.8–10.8)

## 2019-07-26 LAB — SEDIMENTATION RATE: Sed Rate: 14 mm/h (ref 0–15)

## 2019-07-26 LAB — SAR COV2 SEROLOGY (COVID19)AB(IGG),IA: SARS CoV2 AB IGG: NEGATIVE

## 2019-07-26 LAB — C-REACTIVE PROTEIN: CRP: 2.7 mg/L (ref <8.0)

## 2019-07-26 MED FILL — predniSONE 20 MG TABS: 20 | 38 days supply | Qty: 90 | Fill #0

## 2019-07-27 DIAGNOSIS — H4911 Fourth [trochlear] nerve palsy, right eye: Secondary | ICD-10-CM | POA: Diagnosis not present

## 2019-07-29 LAB — CBC WITH DIFFERENTIAL/PLATELET
Absolute Monocytes: 871 cells/uL (ref 200–950)
Basophils Absolute: 67 cells/uL (ref 0–200)
Basophils Relative: 0.5 %
Eosinophils Absolute: 121 cells/uL (ref 15–500)
Eosinophils Relative: 0.9 %
HCT: 40.3 % (ref 38.5–50.0)
Hemoglobin: 13.5 g/dL (ref 13.2–17.1)
Lymphs Abs: 3028 cells/uL (ref 850–3900)
MCH: 30.7 pg (ref 27.0–33.0)
MCHC: 33.5 g/dL (ref 32.0–36.0)
MCV: 91.6 fL (ref 80.0–100.0)
MPV: 10.6 fL (ref 7.5–12.5)
Monocytes Relative: 6.5 %
Neutro Abs: 9313 cells/uL — ABNORMAL HIGH (ref 1500–7800)
Neutrophils Relative %: 69.5 %
Platelets: 389 10*3/uL (ref 140–400)
RBC: 4.4 10*6/uL (ref 4.20–5.80)
RDW: 13.9 % (ref 11.0–15.0)
Total Lymphocyte: 22.6 %
WBC: 13.4 10*3/uL — ABNORMAL HIGH (ref 3.8–10.8)

## 2019-07-29 LAB — C-REACTIVE PROTEIN: CRP: 4.7 mg/L (ref <8.0)

## 2019-07-29 LAB — COMPLETE METABOLIC PANEL WITH GFR
AG Ratio: 1.5 (calc) (ref 1.0–2.5)
ALT: 67 U/L — ABNORMAL HIGH (ref 9–46)
AST: 20 U/L (ref 10–40)
Albumin: 4 g/dL (ref 3.6–5.1)
Alkaline phosphatase (APISO): 59 U/L (ref 36–130)
BUN: 23 mg/dL (ref 7–25)
CO2: 30 mmol/L (ref 20–32)
Calcium: 9.2 mg/dL (ref 8.6–10.3)
Chloride: 103 mmol/L (ref 98–110)
Creat: 0.8 mg/dL (ref 0.60–1.35)
GFR, Est African American: 142 mL/min/{1.73_m2} (ref >=60)
GFR, Est Non African American: 122 mL/min/{1.73_m2} (ref >=60)
Globulin: 2.6 g/dL (calc) (ref 1.9–3.7)
Glucose, Bld: 82 mg/dL (ref 65–99)
Potassium: 4.1 mmol/L (ref 3.5–5.3)
Sodium: 140 mmol/L (ref 135–146)
Total Bilirubin: 0.9 mg/dL (ref 0.2–1.2)
Total Protein: 6.6 g/dL (ref 6.1–8.1)

## 2019-07-29 LAB — SEDIMENTATION RATE: Sed Rate: 9 mm/h (ref 0–15)

## 2019-07-29 LAB — EHRLICHIA ANTIBODY PANEL
E. CHAFFEENSIS AB IGG: <1:64 {titer}
E. CHAFFEENSIS AB IGM: <1:20 {titer}

## 2019-07-29 LAB — ROCKY MTN SPOTTED FVR ABS PNL(IGG+IGM)
RMSF IgG: NOT DETECTED
RMSF IgM: NOT DETECTED

## 2019-07-29 LAB — FERRITIN: Ferritin: 386 ng/mL — ABNORMAL HIGH (ref 38–380)

## 2019-08-03 ENCOUNTER — Encounter: Payer: Self-pay | Admitting: Physician Assistant

## 2019-08-03 ENCOUNTER — Ambulatory Visit: Payer: 59 | Admitting: Physician Assistant

## 2019-08-03 ENCOUNTER — Ambulatory Visit (INDEPENDENT_AMBULATORY_CARE_PROVIDER_SITE_OTHER): Payer: 59 | Admitting: Physician Assistant

## 2019-08-03 ENCOUNTER — Other Ambulatory Visit: Payer: Self-pay

## 2019-08-03 VITALS — BP 143/66 | HR 103 | Temp 97.9°F | Ht 71.0 in | Wt 232.0 lb

## 2019-08-03 DIAGNOSIS — M061 Adult-onset Still's disease: Secondary | ICD-10-CM

## 2019-08-03 DIAGNOSIS — F4321 Adjustment disorder with depressed mood: Secondary | ICD-10-CM | POA: Diagnosis not present

## 2019-08-03 DIAGNOSIS — R531 Weakness: Secondary | ICD-10-CM

## 2019-08-03 DIAGNOSIS — M545 Low back pain, unspecified: Secondary | ICD-10-CM

## 2019-08-03 DIAGNOSIS — H532 Diplopia: Secondary | ICD-10-CM

## 2019-08-03 DIAGNOSIS — M2550 Pain in unspecified joint: Secondary | ICD-10-CM

## 2019-08-03 MED ORDER — TRAMADOL HCL 50 MG PO TABS: 50.0000 mg | ORAL_TABLET | Freq: Four times a day (QID) | ORAL | 0 refills | Status: AC | PRN

## 2019-08-03 MED FILL — traMADol HCL 50 MG TABS: 50 | 5 days supply | Qty: 20 | Fill #0

## 2019-08-03 NOTE — Patient Instructions (Signed)
cymbalta for pain and mood.

## 2019-08-03 NOTE — Progress Notes (Signed)
Subjective:    Patient ID: Anees Vanecek, male    DOB: 1991/04/20, 28 y.o.   MRN: [id number]  HPI  Patient is a 28 year old male who presents to the clinic for his 2-week follow-up being written out of work after hospitalization due to suspected meningitis and then diagnosis of stills disease.  He has seen rheumatology and they also believe this is stills disease.  They suspect his prednisone taper was too fast and they have slowed that down over the next month.  He is also seeing ophthalmology for his double vision which is not improving.  They suspect it could be prednisone related.  Patient does feel like he is improving however he is still really weak.  Even lifting and think car seat carriers he is very shaky.  He continues to not feel safe while driving due to the double vision.  He continues to be very achy and stiff all over. He has a lot of low midline back pain where LP was done. At times it makes it difficult to sleep.    .. Active Ambulatory Problems    Diagnosis Date Noted  . Ringworm 06/29/2016  . Hypertriglyceridemia 06/30/2016  . Chronic heel pain, left 05/20/2018  . Sepsis ([facility]) 07/06/2019  . FUO (fever of unknown origin) 07/07/2019  . Headache 07/07/2019  . Rash 07/07/2019  . Acute respiratory failure with hypoxemia ([facility]) 07/08/2019  . Abnormal liver function   . Aseptic meningitis   . Leukocytosis   . Multiple joint pain 07/20/2019  . Pneumonia of both lungs due to infectious organism 07/20/2019  . Vision loss, bilateral 07/25/2019  . Myalgia 07/25/2019  . Polyarthritis 07/25/2019  . 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 Double vision 08/05/2019  . Weakness 08/05/2019  . Adjustment disorder with depressed mood 08/05/2019  . Acute midline low back pain without sciatica 08/05/2019   Resolved Ambulatory Problems    Diagnosis Date Noted  . Bacterial meningitis 07/08/2019   Past Medical History:  Diagnosis Date  . Hyperlipidemia     Review of  Systems See HPI.     Objective:   Physical Exam Vitals reviewed.  Constitutional:      Appearance: Normal appearance.  HENT:     Head: Normocephalic.  Cardiovascular:     Rate and Rhythm: Normal rate and regular rhythm.     Pulses: Normal pulses.  Pulmonary:     Effort: Pulmonary effort is normal.     Breath sounds: Normal breath sounds.  Musculoskeletal:     Comments: Lower and upper extremity weak and shaky.   Neurological:     General: No focal deficit present.     Mental Status: He is alert and oriented to person, place, and time.  Psychiatric:        Mood and Affect: Mood normal.     Assessment & Plan:  [name] KitchenMarland KitchenBo was seen today for follow-up.  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   I do not think patient is able to go to work at this time.  I wrote him out for another 4 weeks until he can have a recheck with ophthalmology and his double vision and another  visit with rheumatology.  I am reassured that he in 2 weeks is out of a wheelchair and seems to be increasing in strength.  Patient was very tearful in the encounter about this disease.  Certainly with major life changes and possible chronic pain depression is often accompanied.  Discussed there are some medicines that could potentially help with this.  Patient not ready for medications at this time.  Discussed with patient to take this day by day.  Hopefully in the next 4 weeks he will continue to work on his strength and as he is tapering off prednisone begin to feel better and better.  I know the double vision is of particular concern especially with work safety.  Hopefully with decreasing prednisone that will improve.  It  is very reassuring that ophthalmology does not see anything structurally wrong with his eyes.  Tried to reassure patient that there are other medications that he is not on that could put this disease in remission.  Patient is having some significant pain in the back area especially over lumbar puncture.  Discussed IcyHot patch over the area, TENS unit.  I did give him some as needed tramadol to use. Pt aware controlled substance and to use as needed.   Note for work for next 4 weeks. Given. Follow up in 4 weeks.   [name] KitchenMarland KitchenPDMP reviewed during this encounter.   [name].Spent 30 minutes with patient and greater than 50 percent of visit spent counseling patient regarding treatment plan.

## 2019-08-04 ENCOUNTER — Other Ambulatory Visit: Payer: Self-pay | Admitting: *Deleted

## 2019-08-04 NOTE — Patient Outreach (Signed)
[facility]) Care Management  08/04/2019  Sedric Guia Jul 23, 1991 [id number]   Monthly telephonic [facility] case management review with [facility] RNCMs and Dr Alonza Bogus. Update provided to include:  Discussed transition of care call, no additional care management needs identified. Patient has completed post discharge PCP follow up as well as speciality visits.      Joylene Draft, RN, [place] Management Coordinator  [phone]- Mobile [phone]- Toll Free Main Office

## 2019-08-05 ENCOUNTER — Encounter: Payer: Self-pay | Admitting: Physician Assistant

## 2019-08-05 DIAGNOSIS — H532 Diplopia: Secondary | ICD-10-CM | POA: Insufficient documentation

## 2019-08-05 DIAGNOSIS — M545 Low back pain, unspecified: Secondary | ICD-10-CM | POA: Insufficient documentation

## 2019-08-05 DIAGNOSIS — F4321 Adjustment disorder with depressed mood: Secondary | ICD-10-CM | POA: Insufficient documentation

## 2019-08-05 DIAGNOSIS — R531 Weakness: Secondary | ICD-10-CM | POA: Insufficient documentation

## 2019-08-23 DIAGNOSIS — R509 Fever, unspecified: Secondary | ICD-10-CM | POA: Diagnosis not present

## 2019-08-23 DIAGNOSIS — E669 Obesity, unspecified: Secondary | ICD-10-CM | POA: Diagnosis not present

## 2019-08-23 DIAGNOSIS — M061 Adult-onset Still's disease: Secondary | ICD-10-CM | POA: Diagnosis not present

## 2019-08-23 DIAGNOSIS — Z6833 Body mass index (BMI) 33.0-33.9, adult: Secondary | ICD-10-CM | POA: Diagnosis not present

## 2019-08-23 DIAGNOSIS — H539 Unspecified visual disturbance: Secondary | ICD-10-CM | POA: Diagnosis not present

## 2019-08-23 MED FILL — predniSONE 10 MG TABS: 10 | 30 days supply | Qty: 120 | Fill #0

## 2019-08-29 ENCOUNTER — Ambulatory Visit: Payer: 59 | Admitting: Physician Assistant

## 2019-08-29 DIAGNOSIS — H40053 Ocular hypertension, bilateral: Secondary | ICD-10-CM | POA: Diagnosis not present

## 2019-08-29 DIAGNOSIS — H4911 Fourth [trochlear] nerve palsy, right eye: Secondary | ICD-10-CM | POA: Diagnosis not present

## 2019-09-23 DIAGNOSIS — H539 Unspecified visual disturbance: Secondary | ICD-10-CM | POA: Diagnosis not present

## 2019-09-23 DIAGNOSIS — E669 Obesity, unspecified: Secondary | ICD-10-CM | POA: Diagnosis not present

## 2019-09-23 DIAGNOSIS — M061 Adult-onset Still's disease: Secondary | ICD-10-CM | POA: Diagnosis not present

## 2019-09-23 DIAGNOSIS — Z6834 Body mass index (BMI) 34.0-34.9, adult: Secondary | ICD-10-CM | POA: Diagnosis not present

## 2019-09-23 DIAGNOSIS — R509 Fever, unspecified: Secondary | ICD-10-CM | POA: Diagnosis not present

## 2019-10-12 ENCOUNTER — Telehealth (INDEPENDENT_AMBULATORY_CARE_PROVIDER_SITE_OTHER): Payer: Medicaid Other | Admitting: Physician Assistant

## 2019-10-12 VITALS — BP 122/74 | Ht 71.0 in | Wt 240.0 lb

## 2019-10-12 DIAGNOSIS — M546 Pain in thoracic spine: Secondary | ICD-10-CM

## 2019-10-12 DIAGNOSIS — F4321 Adjustment disorder with depressed mood: Secondary | ICD-10-CM

## 2019-10-12 DIAGNOSIS — M130 Polyarthritis, unspecified: Secondary | ICD-10-CM

## 2019-10-12 DIAGNOSIS — M061 Adult-onset Still's disease: Secondary | ICD-10-CM

## 2019-10-12 MED ORDER — DULOXETINE HCL 30 MG PO CPEP: 30.0000 mg | ORAL_CAPSULE | Freq: Every day | ORAL | 2 refills | Status: DC

## 2019-10-12 MED ORDER — CYCLOBENZAPRINE HCL 10 MG PO TABS: 10.0000 mg | ORAL_TABLET | Freq: Three times a day (TID) | ORAL | 0 refills | Status: DC | PRN

## 2019-10-12 NOTE — Progress Notes (Signed)
Patient ID: Raymond Owens, male   DOB: April 23, 1991, 29 y.o.   MRN: [id number] .[name] KitchenVirtual Visit via Video Note  I connected with Raymond Owens on 10/12/2019 at  1:00 PM EST by a video enabled telemedicine application and verified that I am speaking with the correct person using two identifiers.  Location: Patient: home Provider: clinic   I discussed the limitations of evaluation and management by telemedicine and the availability of in person appointments. The patient expressed understanding and agreed to proceed.  History of Present Illness: Pt is a 29 yo male here to follow up on Adult Stills disease and getting back to work.   He is working with opthalmology and vision is improving. Most likely reducing prednisone is helping a lot. He also had prisms made for glasses. He is now driving.   He continues to follow up with rheumatology. He continues to be on higher doses of prednisone only to manage symptoms. They are considering methotrexate vs Kineret for management. He is gaining strength and energy but in pain every day. He has quite a bit of likely prednisone side effects with hot flashes etc. He has not been back to [employer] job. They are saving his job for 6 months. 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 Work is not strenuous and he is doing ok. He really wants to get back to [employer].   He complains of some intermittent mid back pain that causes his back to "lock up". He usually just rest and it goes away. No known injury. No radiation of pain.   Pt denies any SI/HC but he is more down and tearful. He just fears life will never be the same.   .. Active Ambulatory Problems    Diagnosis Date Noted  . Ringworm 06/29/2016  . Hypertriglyceridemia 06/30/2016  . Chronic heel pain, left 05/20/2018  . Sepsis (HCC) 07/06/2019  . FUO (fever of unknown origin) 07/07/2019  . Headache 07/07/2019  . Rash 07/07/2019  . Acute respiratory failure with hypoxemia (HCC) 07/08/2019  .  Abnormal liver function   . Aseptic meningitis   . Leukocytosis   . Multiple joint pain 07/20/2019  . Pneumonia of both lungs due to infectious organism 07/20/2019  . Vision loss, bilateral 07/25/2019  . Myalgia 07/25/2019  . Polyarthritis 07/25/2019  . 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 Double vision 08/05/2019  . Weakness 08/05/2019  . Adjustment disorder with depressed mood 08/05/2019  . Acute midline low back pain without sciatica 08/05/2019  . Acute midline thoracic back pain 10/14/2019   Resolved Ambulatory Problems    Diagnosis Date Noted  . Bacterial meningitis 07/08/2019   Past Medical History:  Diagnosis Date  . Hyperlipidemia    Reviewed med, allergy, problem list.     Observations/Objective: No acute distress.  Normal breathing.  No cough.   .. Today's Vitals   10/12/19 1119  BP: 122/74  Weight: 240 lb (108.9 kg)  Height: 5\' 11"  (1.803 m)   Body mass index is 33.47 kg/m.     Assessment and Plan: [name] KitchenDashawn was seen today for advice only.  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HCC)  Polyarthritis -     DULoxetine (CYMBALTA) 30 MG capsule; Take 1 capsule (30 mg total) by mouth daily.  Adjustment disorder with depressed mood -     DULoxetine (CYMBALTA) 30 MG capsule; Take 1 capsule (30 mg total) by mouth daily.  Acute midline thoracic back pain -  cyclobenzaprine (FLEXERIL) 10 MG tablet; Take 1 tablet (10 mg total) by mouth 3 (three) times daily as needed for muscle spasms.   Keep all follow ups with specialist.  Not ready to go back to work yet. Written out for another 2 months.  Certainly feel like depressed mood. Start cymbalta for mood and pain.  Given muscle relaxer to use as needed.  Consider icy hot, tens unit, massage for mid back pain. Use good lifting techniques and good posture. Come into office for further work up.   Follow up in 2 months.     Follow Up Instructions:    I  discussed the assessment and treatment plan with the patient. The patient was provided an opportunity to ask questions and all were answered. The patient agreed with the plan and demonstrated an understanding of the instructions.   The patient was advised to call back or seek an in-person evaluation if the symptoms worsen or if the condition fails to improve as anticipated.  I provided  35 minutes of non-face-to-face time during this encounter.   Iran Planas, PA-C

## 2019-10-12 NOTE — Progress Notes (Signed)
Wants to discuss rheumatology recommendations

## 2019-10-14 DIAGNOSIS — M546 Pain in thoracic spine: Secondary | ICD-10-CM | POA: Insufficient documentation

## 2019-10-31 DIAGNOSIS — H4911 Fourth [trochlear] nerve palsy, right eye: Secondary | ICD-10-CM | POA: Diagnosis not present

## 2019-10-31 DIAGNOSIS — H5213 Myopia, bilateral: Secondary | ICD-10-CM | POA: Diagnosis not present

## 2019-10-31 DIAGNOSIS — H40053 Ocular hypertension, bilateral: Secondary | ICD-10-CM | POA: Diagnosis not present

## 2019-11-09 ENCOUNTER — Encounter: Payer: Self-pay | Admitting: Physician Assistant

## 2019-11-15 DIAGNOSIS — H539 Unspecified visual disturbance: Secondary | ICD-10-CM | POA: Diagnosis not present

## 2019-11-15 DIAGNOSIS — M061 Adult-onset Still's disease: Secondary | ICD-10-CM | POA: Diagnosis not present

## 2019-11-15 DIAGNOSIS — E669 Obesity, unspecified: Secondary | ICD-10-CM | POA: Diagnosis not present

## 2019-11-15 DIAGNOSIS — R509 Fever, unspecified: Secondary | ICD-10-CM | POA: Diagnosis not present

## 2019-11-15 DIAGNOSIS — Z6834 Body mass index (BMI) 34.0-34.9, adult: Secondary | ICD-10-CM | POA: Diagnosis not present

## 2019-11-15 LAB — CBC AND DIFFERENTIAL
HCT: 47 (ref 41–53)
Hemoglobin: 16 (ref 13.5–17.5)
Platelets: 314 (ref 150–399)
WBC: 11.8

## 2019-11-15 LAB — HEPATIC FUNCTION PANEL
ALT: 34 (ref 10–40)
AST: 20 (ref 14–40)
Alkaline Phosphatase: 67 (ref 25–125)
Bilirubin, Total: 0.3

## 2019-11-15 LAB — BASIC METABOLIC PANEL
BUN: 14 (ref 4–21)
CO2: 26 — AB (ref 13–22)
Chloride: 100 (ref 99–108)
Creatinine: 1 (ref 0.6–1.3)
Glucose: 80
Potassium: 3.8 (ref 3.4–5.3)
Sodium: 138 (ref 137–147)

## 2019-11-15 LAB — POCT ERYTHROCYTE SEDIMENTATION RATE, NON-AUTOMATED: Sed Rate: 7

## 2019-11-15 LAB — CBC: RBC: 5.17 — AB (ref 3.87–5.11)

## 2019-11-15 MED ORDER — VENLAFAXINE HCL ER 37.5 MG PO CP24: 37.5000 mg | ORAL_CAPSULE | Freq: Every day | ORAL | 1 refills | Status: DC

## 2019-11-16 LAB — COMPREHENSIVE METABOLIC PANEL
Albumin: 4.5 (ref 3.5–5.0)
Calcium: 9.6 (ref 8.7–10.7)
GFR calc Af Amer: 122
GFR calc non Af Amer: 105
Globulin: 2.6

## 2019-11-21 ENCOUNTER — Other Ambulatory Visit: Payer: Self-pay | Admitting: Neurology

## 2019-11-21 DIAGNOSIS — M546 Pain in thoracic spine: Secondary | ICD-10-CM

## 2019-11-21 MED ORDER — CYCLOBENZAPRINE HCL 10 MG PO TABS: 10.0000 mg | ORAL_TABLET | Freq: Three times a day (TID) | ORAL | 0 refills | Status: DC | PRN

## 2020-01-04 ENCOUNTER — Other Ambulatory Visit: Payer: Self-pay | Admitting: Physician Assistant

## 2020-01-04 DIAGNOSIS — M546 Pain in thoracic spine: Secondary | ICD-10-CM

## 2020-01-04 MED ORDER — CYCLOBENZAPRINE HCL 10 MG PO TABS: 10.0000 mg | ORAL_TABLET | Freq: Three times a day (TID) | ORAL | 0 refills | Status: DC | PRN

## 2020-01-16 ENCOUNTER — Other Ambulatory Visit: Payer: Self-pay | Admitting: Physician Assistant

## 2020-02-02 DIAGNOSIS — H40013 Open angle with borderline findings, low risk, bilateral: Secondary | ICD-10-CM | POA: Diagnosis not present

## 2020-02-02 DIAGNOSIS — H4911 Fourth [trochlear] nerve palsy, right eye: Secondary | ICD-10-CM | POA: Diagnosis not present

## 2020-02-03 DIAGNOSIS — H5213 Myopia, bilateral: Secondary | ICD-10-CM | POA: Diagnosis not present

## 2020-02-10 ENCOUNTER — Emergency Department (HOSPITAL_BASED_OUTPATIENT_CLINIC_OR_DEPARTMENT_OTHER): Payer: Medicaid Other

## 2020-02-10 ENCOUNTER — Other Ambulatory Visit: Payer: Self-pay

## 2020-02-10 ENCOUNTER — Encounter (HOSPITAL_BASED_OUTPATIENT_CLINIC_OR_DEPARTMENT_OTHER): Payer: Self-pay | Admitting: *Deleted

## 2020-02-10 ENCOUNTER — Emergency Department (HOSPITAL_BASED_OUTPATIENT_CLINIC_OR_DEPARTMENT_OTHER)
Admission: EM | Admit: 2020-02-10 | Discharge: 2020-02-10 | Disposition: A | Discharge status: Home / Self Care | Payer: Medicaid Other | Attending: Emergency Medicine | Admitting: Emergency Medicine

## 2020-02-10 DIAGNOSIS — S99911A Unspecified injury of right ankle, initial encounter: Secondary | ICD-10-CM | POA: Diagnosis not present

## 2020-02-10 DIAGNOSIS — Z880 Allergy status to penicillin: Secondary | ICD-10-CM | POA: Diagnosis not present

## 2020-02-10 DIAGNOSIS — W19XXXA Unspecified fall, initial encounter: Secondary | ICD-10-CM

## 2020-02-10 DIAGNOSIS — Y9301 Activity, walking, marching and hiking: Secondary | ICD-10-CM | POA: Insufficient documentation

## 2020-02-10 DIAGNOSIS — W010XXA Fall on same level from slipping, tripping and stumbling without subsequent striking against object, initial encounter: Secondary | ICD-10-CM | POA: Insufficient documentation

## 2020-02-10 DIAGNOSIS — Y999 Unspecified external cause status: Secondary | ICD-10-CM | POA: Diagnosis not present

## 2020-02-10 DIAGNOSIS — Y929 Unspecified place or not applicable: Secondary | ICD-10-CM | POA: Insufficient documentation

## 2020-02-10 DIAGNOSIS — Z87891 Personal history of nicotine dependence: Secondary | ICD-10-CM | POA: Diagnosis not present

## 2020-02-10 DIAGNOSIS — S96911A Strain of unspecified muscle and tendon at ankle and foot level, right foot, initial encounter: Secondary | ICD-10-CM | POA: Insufficient documentation

## 2020-02-10 DIAGNOSIS — S79911A Unspecified injury of right hip, initial encounter: Secondary | ICD-10-CM | POA: Diagnosis not present

## 2020-02-10 DIAGNOSIS — S93401A Sprain of unspecified ligament of right ankle, initial encounter: Secondary | ICD-10-CM | POA: Diagnosis not present

## 2020-02-10 HISTORY — DX: Juvenile rheumatoid arthritis with systemic onset, unspecified site: M08.20

## 2020-02-10 IMAGING — CR DG ANKLE COMPLETE 3+V*R*
3 series · 3 of 3 positions shown · non-contrast
Comparison: None.

CLINICAL DATA: Status post fall.

EXAM:
RIGHT ANKLE - COMPLETE 3+ VIEW

[t ankle joint lat right]
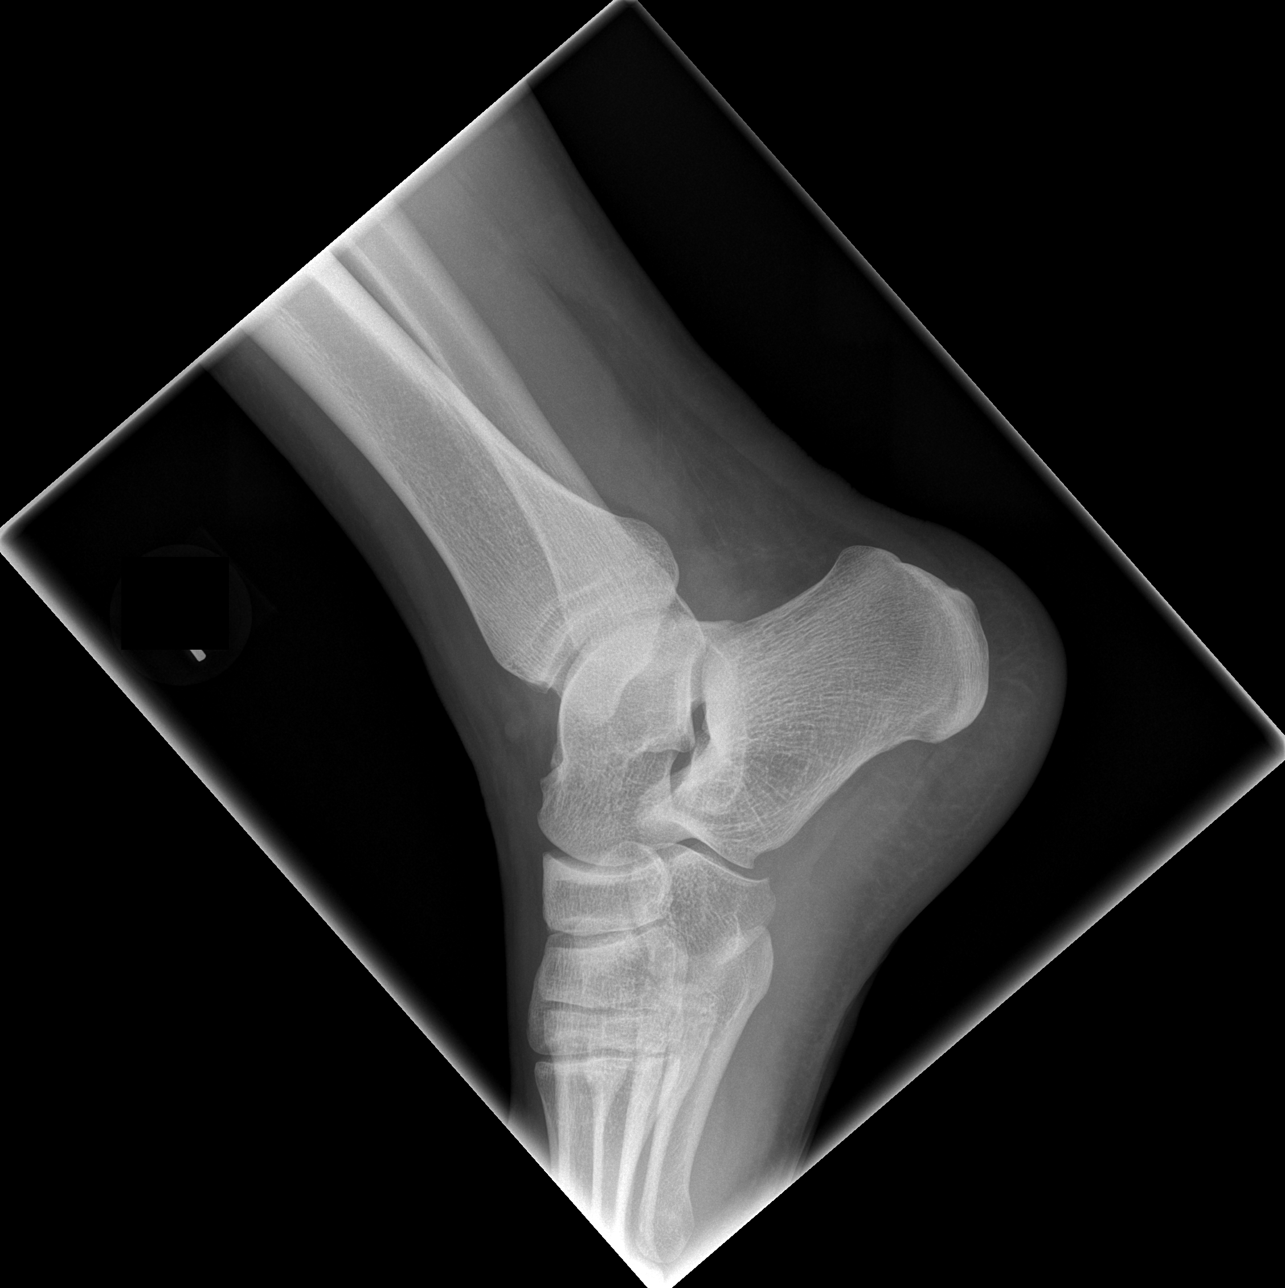

[t ankle joint ap right]
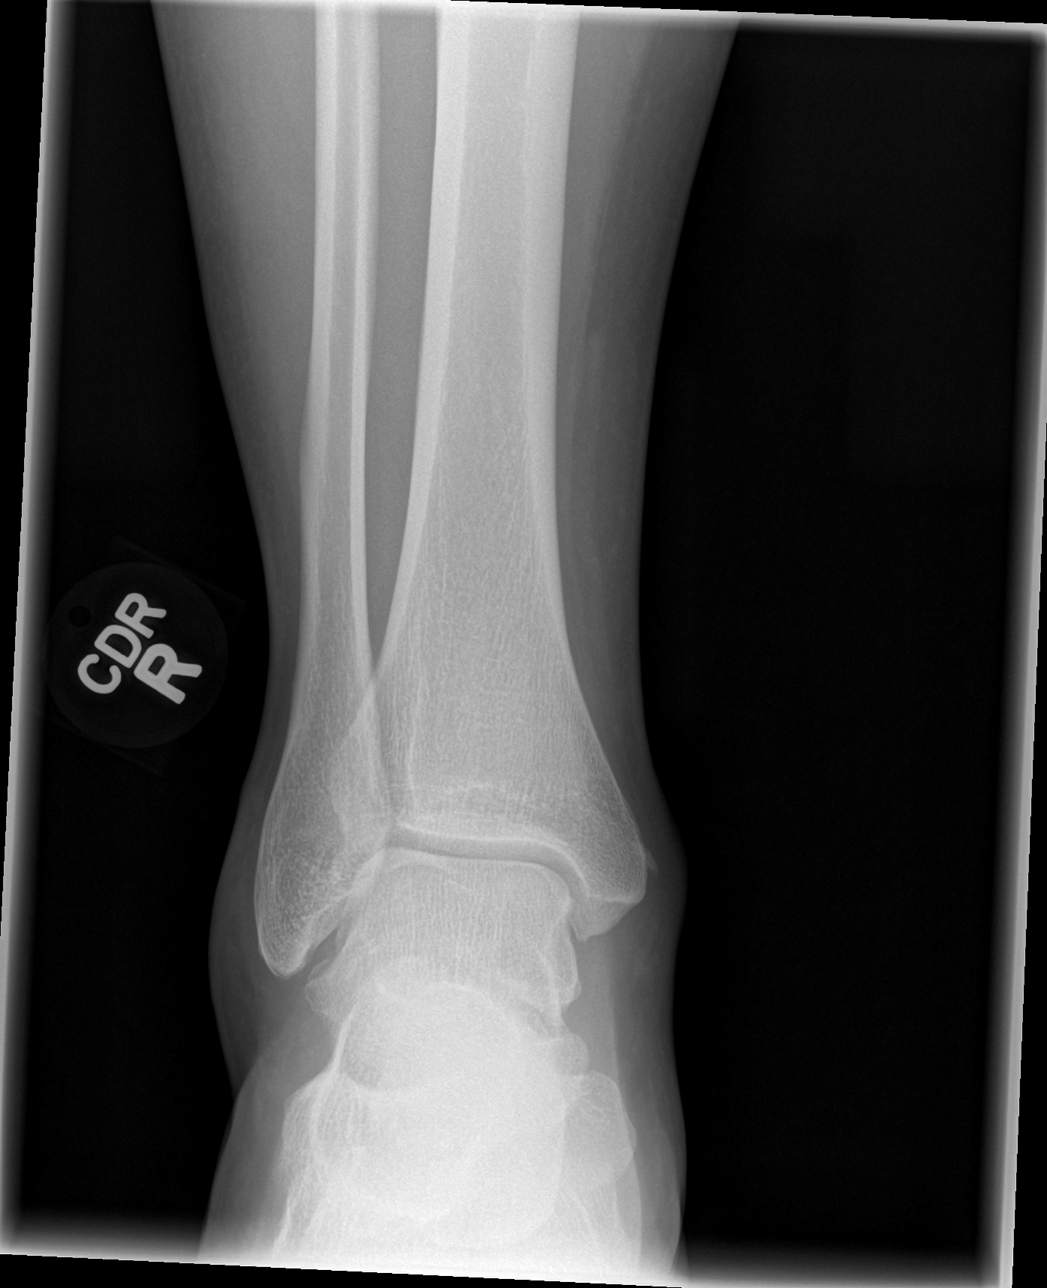

[t ankle joint oblique right]
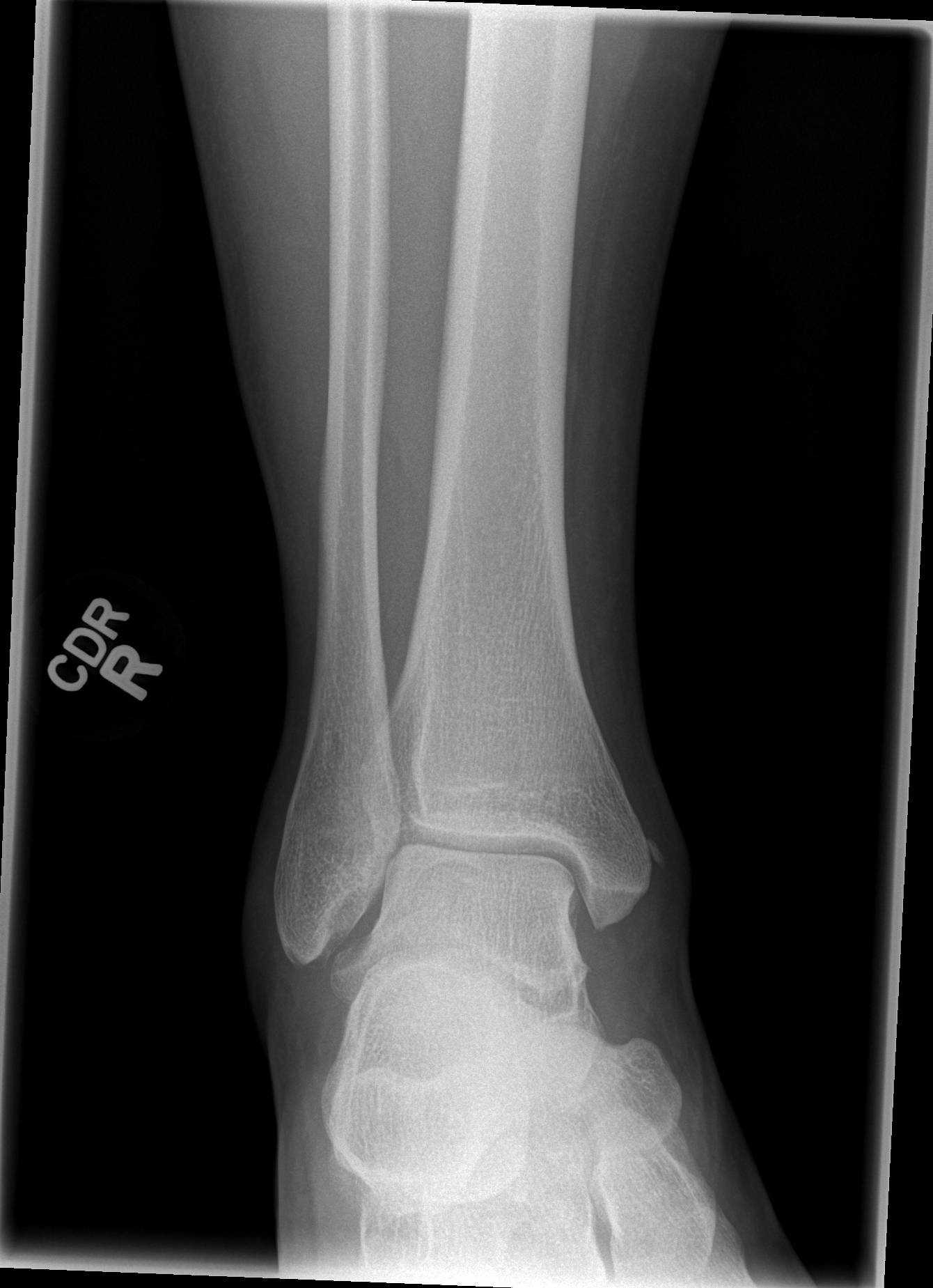

[3 of 3 positions shown; findings below may reference images not displayed]

FINDINGS: There is no evidence of an acute fracture, dislocation, or joint
effusion. A 3 mm chronic appearing cortical density is seen adjacent
to the right medial malleolus. There is no evidence of arthropathy
or other focal bone abnormality. Soft tissues are unremarkable.
IMPRESSION: No acute osseous abnormality.

## 2020-02-10 IMAGING — CR DG HIP (WITH OR WITHOUT PELVIS) 2-3V*R*
3 series · 3 of 3 positions shown · non-contrast
Comparison: None.

CLINICAL DATA: Status post fall.

EXAM:
DG HIP (WITH OR WITHOUT PELVIS) 2-3V RIGHT

[t pelvis a.p.]
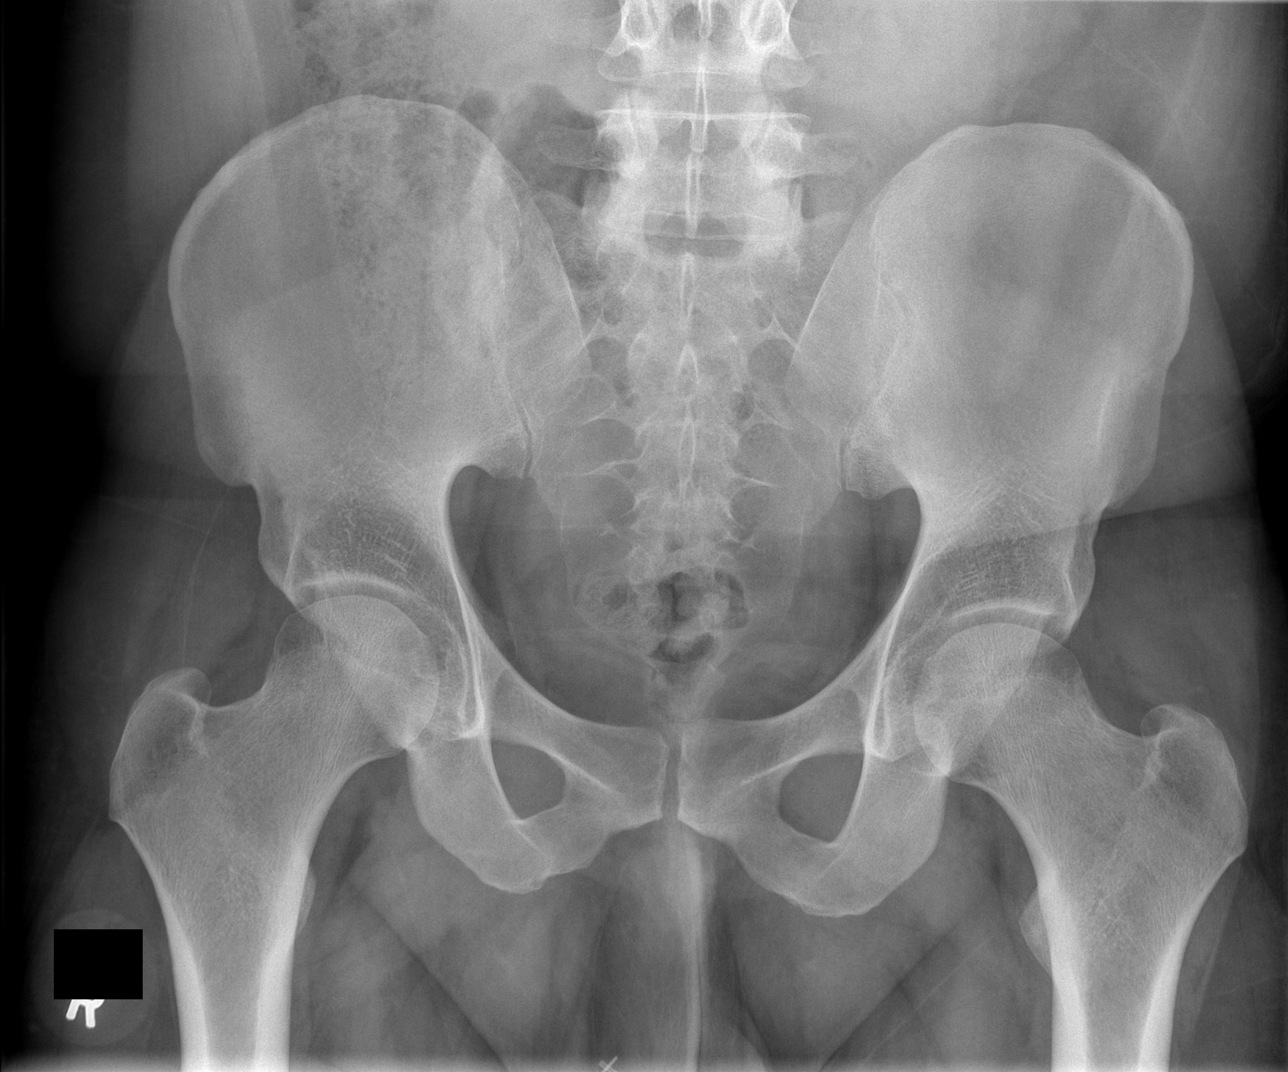

[t hip ap right]
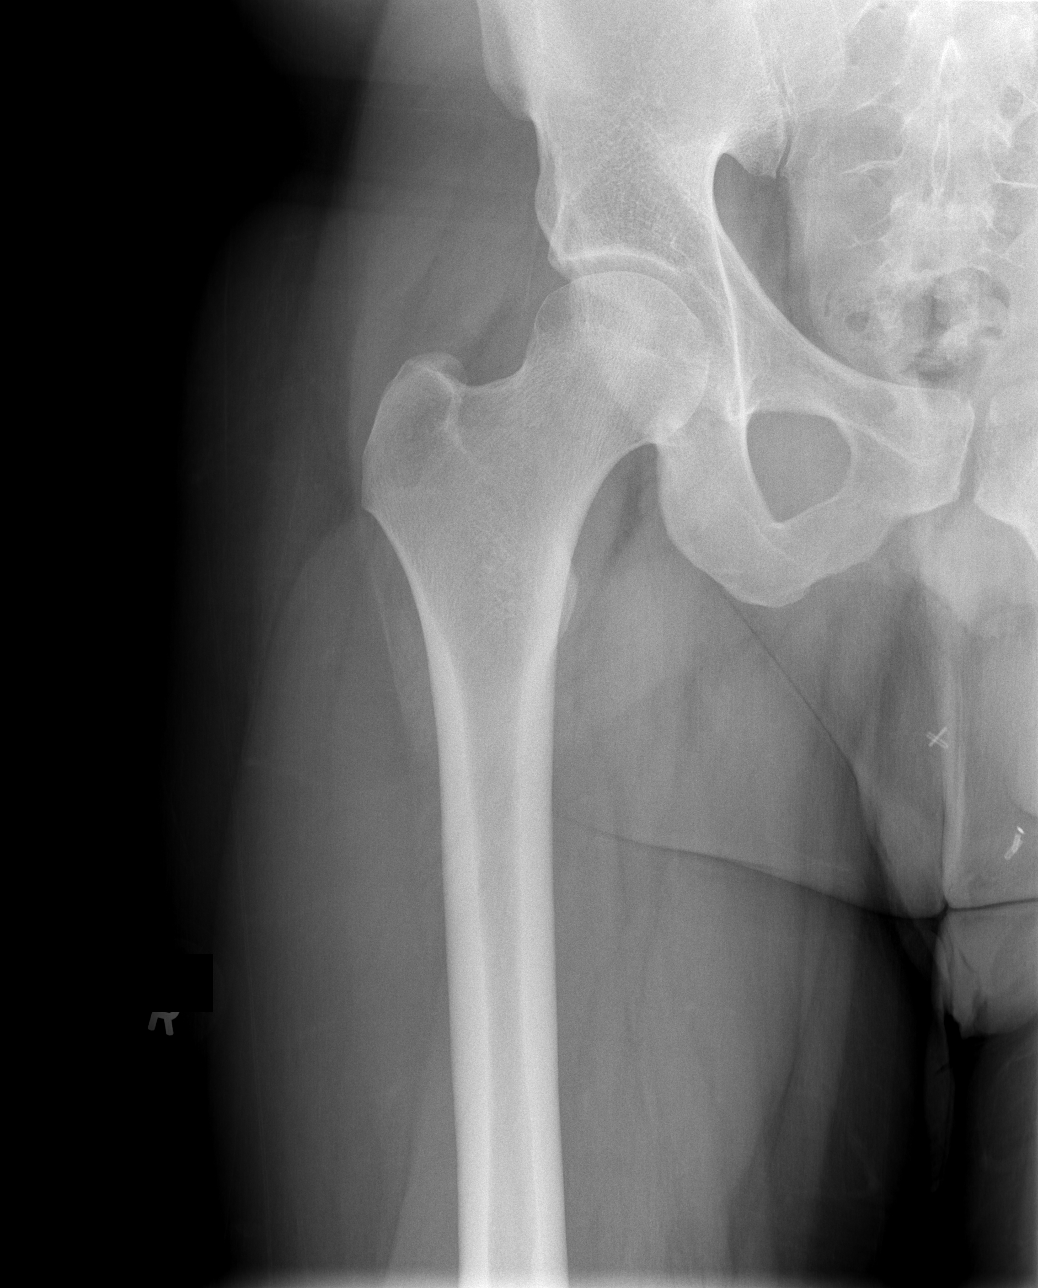

[t hip frog leg right]
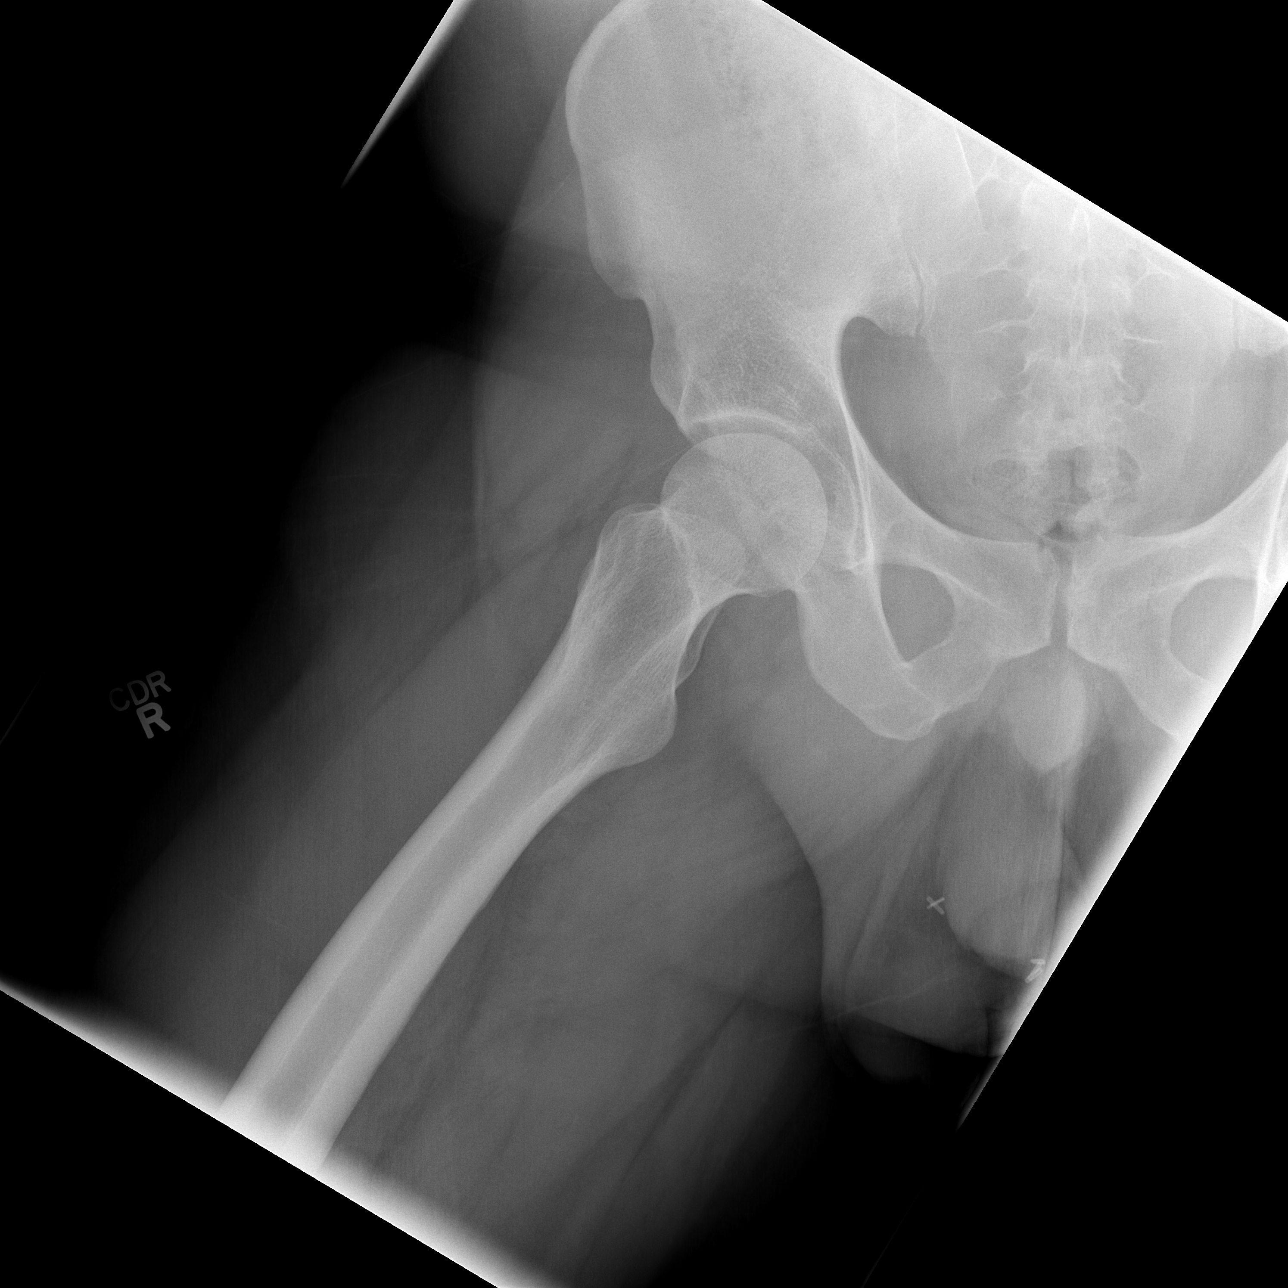

[3 of 3 positions shown; findings below may reference images not displayed]

FINDINGS: There is no evidence of hip fracture or dislocation. There is no
evidence of arthropathy or other focal bone abnormality.
IMPRESSION: Negative.

## 2020-02-10 NOTE — ED Triage Notes (Signed)
Pt co fall with right ankle and hip injury x 1 hr ago

## 2020-02-10 NOTE — ED Provider Notes (Signed)
[facility] EMERGENCY DEPARTMENT Provider Note   CSN: [id number] Arrival date & time: 02/10/20  1724     History Chief Complaint  Patient presents with  . Fall    Ruddy Swire is a 29 y.o. male.  The history is provided by the patient, the spouse and medical records. No language interpreter was used.  Fall   Eliza Grissinger is a 29 y.o. male who presents to the Emergency Department complaining of fall. He presents the emergency department complaining of ankle injury following a fall. This occurred just prior to ED arrival. He was walking down a step in his right ankle twisted. He felt immediate pain in his right hip as well as his right lateral ankle. He did take Tylenol with partial improvement in his symptoms. He is on prednisone and methotrexate for stills disease. Symptoms are severe and constant in nature.     Past Medical History:  Diagnosis Date  . Hyperlipidemia   . Myalgia 07/25/2019  . Polyarthritis 07/25/2019  . 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 Vision loss, bilateral 07/25/2019    Patient Active Problem List   Diagnosis Date Noted  . Acute midline thoracic back pain 10/14/2019  . Double vision 08/05/2019  . Weakness 08/05/2019  . Adjustment disorder with depressed mood 08/05/2019  . Acute midline low back pain without sciatica 08/05/2019  . 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 Vision loss, bilateral 07/25/2019  . Myalgia 07/25/2019  . Polyarthritis 07/25/2019  . Multiple joint pain 07/20/2019  . Pneumonia of both lungs due to infectious organism 07/20/2019  . Abnormal liver function   . Aseptic meningitis   . Leukocytosis   . Acute respiratory failure with hypoxemia ([place]) 07/08/2019  . FUO (fever of unknown origin) 07/07/2019  . Headache 07/07/2019  . Rash 07/07/2019  . Sepsis ([place]) 07/06/2019  . Chronic heel pain, left 05/20/2018  . Hypertriglyceridemia 06/30/2016  . Ringworm 06/29/2016    History reviewed. No pertinent surgical  history.     Family History  Problem Relation Age of Onset  . Diabetes Mother   . Heart attack Maternal Uncle   . Cancer Maternal Grandmother   . Cancer Maternal Grandfather   . Cancer Paternal Grandmother   . Cancer Paternal Grandfather     Social History   Tobacco Use  . Smoking status: Former Smoker    Quit date: 10/10/2011    Years since quitting: 8.3  . Smokeless tobacco: Never Used  Substance Use Topics  . Alcohol use: Yes  . Drug use: No    Home Medications Prior to Admission medications   Medication Sig Start Date End Date Taking? Authorizing Provider  acetaminophen (TYLENOL) 500 MG tablet Take 1,000 mg by mouth every 6 (six) hours as needed for mild pain, moderate pain or headache.   Yes [provider]  Glucosamine-Chondroitin-MSM [phone] MG TABS  08/26/19  Yes [provider]  ibuprofen (ADVIL) 200 MG tablet Take 400 mg by mouth every 6 (six) hours as needed for headache, mild pain, moderate pain or cramping.   Yes [provider]  Omega-3 Fatty Acids (FISH OIL) 1000 MG CAPS  08/26/19  Yes [provider]  predniSONE (DELTASONE) 20 MG tablet  07/26/19  Yes [provider]  TURMERIC PO Take by mouth daily.   Yes [provider]  venlafaxine XR (EFFEXOR-XR) 37.5 MG 24 hr capsule TAKE 1 CAPSULE BY MOUTH DAILY WITH BREAKFAST 01/16/20  Yes Breeback, Jade L, PA-C  cyclobenzaprine (FLEXERIL) 10 MG  tablet Take 1 tablet (10 mg total) by mouth 3 (three) times daily as needed for muscle spasms. 01/04/20   Tollie Eth, NP    Allergies    Penicillins  Review of Systems   Review of Systems  All other systems reviewed and are negative.   Physical Exam Updated Vital Signs BP 125/76 (BP Location: Right Arm)   Pulse 91   Temp 98.5 F (36.9 C) (Oral)   Resp 16   Ht 5\' 11"  (1.803 m)   Wt 115.7 kg   SpO2 98%   BMI 35.57 kg/m   Physical Exam Vitals and nursing note reviewed.  Constitutional:      Appearance: He is  well-developed.  HENT:     Head: Normocephalic and atraumatic.  Cardiovascular:     Rate and Rhythm: Normal rate and regular rhythm.  Pulmonary:     Effort: Pulmonary effort is normal. No respiratory distress.  Musculoskeletal:     Comments: 2+ DP pulses bilaterally. There is moderate edema to the right lateral ankle. There is no tenderness to palpation over the foot. There is pain with extension and flexion at the ankle. There is mild tenderness to palpation at the right hip with with range of motion intact at the hip, knee.  Skin:    General: Skin is warm and dry.  Neurological:     Mental Status: He is alert and oriented to person, place, and time.  Psychiatric:        Behavior: Behavior normal.     ED Results / Procedures / Treatments   Labs (all labs ordered are listed, but only abnormal results are displayed) Labs Reviewed - No data to display  EKG None  Radiology DG Ankle Complete Right  Result Date: 02/10/2020 CLINICAL DATA:  Status post fall. EXAM: RIGHT ANKLE - COMPLETE 3+ VIEW COMPARISON:  None. FINDINGS: There is no evidence of an acute fracture, dislocation, or joint effusion. A 3 mm chronic appearing cortical density is seen adjacent to the right medial malleolus. There is no evidence of arthropathy or other focal bone abnormality. Soft tissues are unremarkable. IMPRESSION: No acute osseous abnormality. Electronically Signed   By: 02/12/2020 M.D.   On: 02/10/2020 18:29   DG Hip Unilat W or Wo Pelvis 2-3 Views Right  Result Date: 02/10/2020 CLINICAL DATA:  Status post fall. EXAM: DG HIP (WITH OR WITHOUT PELVIS) 2-3V RIGHT COMPARISON:  None. FINDINGS: There is no evidence of hip fracture or dislocation. There is no evidence of arthropathy or other focal bone abnormality. IMPRESSION: Negative. Electronically Signed   By: 02/12/2020 M.D.   On: 02/10/2020 18:28    Procedures Procedures (including critical care time)  Medications Ordered in ED Medications  - No data to display  ED Course  I have reviewed the triage vital signs and the nursing notes.  Pertinent labs & imaging results that were available during my care of the patient were reviewed by me and considered in my medical decision making (see chart for details).    MDM Rules/Calculators/A&P                          Patient here for evaluation of right ankle, right hip pain following a fall that occurred earlier today. He does have edema to the right lateral ankle with local soft tissue tenderness. Plain films are negative for acute fracture, dislocation. Concern for ankle sprain, possible ligamentous injury. Will place in ASO. Discussed ambulation  with weight-bearing as tolerated, outpatient orthopedics follow-up if his symptoms are not improving. Final Clinical Impression(s) / ED Diagnoses Final diagnoses:  Sprain of right ankle, unspecified ligament, initial encounter  Fall, initial encounter    Rx / DC Orders ED Discharge Orders    None       Tilden Fossa, MD 02/10/20 [phone]

## 2020-02-10 NOTE — ED Notes (Signed)
ED Provider at bedside. 

## 2020-03-02 ENCOUNTER — Other Ambulatory Visit: Payer: Self-pay | Admitting: Physician Assistant

## 2020-03-22 ENCOUNTER — Other Ambulatory Visit: Payer: Self-pay | Admitting: Physician Assistant

## 2020-03-22 ENCOUNTER — Encounter: Payer: Self-pay | Admitting: Physician Assistant

## 2020-03-22 ENCOUNTER — Other Ambulatory Visit: Payer: Self-pay

## 2020-03-22 NOTE — Progress Notes (Signed)
Opened in error

## 2020-03-26 ENCOUNTER — Encounter: Payer: Self-pay | Admitting: Physician Assistant

## 2020-03-26 DIAGNOSIS — M0820 Juvenile rheumatoid arthritis with systemic onset, unspecified site: Secondary | ICD-10-CM

## 2020-03-28 NOTE — Telephone Encounter (Signed)
Referral pended for provider review.  

## 2020-03-30 ENCOUNTER — Ambulatory Visit (INDEPENDENT_AMBULATORY_CARE_PROVIDER_SITE_OTHER): Payer: Medicaid Other | Admitting: Physician Assistant

## 2020-03-30 VITALS — BP 118/65 | HR 84 | Ht 71.0 in | Wt 250.0 lb

## 2020-03-30 DIAGNOSIS — M25572 Pain in left ankle and joints of left foot: Secondary | ICD-10-CM

## 2020-03-30 DIAGNOSIS — M061 Adult-onset Still's disease: Secondary | ICD-10-CM

## 2020-03-30 DIAGNOSIS — R29898 Other symptoms and signs involving the musculoskeletal system: Secondary | ICD-10-CM | POA: Diagnosis not present

## 2020-03-30 DIAGNOSIS — M25571 Pain in right ankle and joints of right foot: Secondary | ICD-10-CM

## 2020-03-30 DIAGNOSIS — G8929 Other chronic pain: Secondary | ICD-10-CM

## 2020-03-30 DIAGNOSIS — F4321 Adjustment disorder with depressed mood: Secondary | ICD-10-CM

## 2020-03-30 DIAGNOSIS — Z79899 Other long term (current) drug therapy: Secondary | ICD-10-CM | POA: Diagnosis not present

## 2020-03-30 MED ORDER — VENLAFAXINE HCL ER 37.5 MG PO CP24: 37.5000 mg | ORAL_CAPSULE | Freq: Every day | ORAL | 1 refills | Status: DC

## 2020-03-30 NOTE — Progress Notes (Signed)
Subjective:    Patient ID: Raymond Owens, male    DOB: Nov 17, 1990, 29 y.o.   MRN: [id number]  HPI  Pt is a 29 yo male with adult onset stills disease after a viral illness that has left hime in chronic pain, stiffness, swelling, weakness. He was seeing Dr. Amil Amen but due to insurance release from office. Recently new referral was placed. He had labs done there a few months ago. He is on prednisone and methotrexate with some relief but if he goes down on prednisone all his symptoms get worse. He rolled his right ankle the other day and was provided a brace that has helped to wear as needed. He wanted to get one for his left ankle to wear as needed.   His mood is doing much better on effexor. He has no problems or concerns.  .. Active Ambulatory Problems    Diagnosis Date Noted  . Ringworm 06/29/2016  . Hypertriglyceridemia 06/30/2016  . Chronic heel pain, left 05/20/2018  . Sepsis ([place]) 07/06/2019  . FUO (fever of unknown origin) 07/07/2019  . Headache 07/07/2019  . Rash 07/07/2019  . Acute respiratory failure with hypoxemia ([place]) 07/08/2019  . Abnormal liver function   . Aseptic meningitis   . Leukocytosis   . Multiple joint pain 07/20/2019  . Pneumonia of both lungs due to infectious organism 07/20/2019  . Vision loss, bilateral 07/25/2019  . Myalgia 07/25/2019  . Polyarthritis 07/25/2019  . 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 Double vision 08/05/2019  . Weakness 08/05/2019  . Adjustment disorder with depressed mood 08/05/2019  . Acute midline low back pain without sciatica 08/05/2019  . Acute midline thoracic back pain 10/14/2019  . Ankle weakness 04/02/2020  . Chronic pain of both ankles 04/02/2020   Resolved Ambulatory Problems    Diagnosis Date Noted  . Bacterial meningitis 07/08/2019   Past Medical History:  Diagnosis Date  . Hyperlipidemia   . Still's disease [facility])        Review of Systems  All other systems reviewed and are negative.       Objective:   Physical Exam Vitals reviewed.  Constitutional:      Appearance: Normal appearance.  Cardiovascular:     Rate and Rhythm: Normal rate and regular rhythm.     Pulses: Normal pulses.  Pulmonary:     Effort: Pulmonary effort is normal.     Breath sounds: Normal breath sounds.  Musculoskeletal:     Comments: All over joint swelling and stiffness.    Neurological:     General: No focal deficit present.     Mental Status: He is alert and oriented to person, place, and time.  Psychiatric:        Mood and Affect: Mood normal.       .. Depression screen [facility] 2/9 03/30/2020 07/25/2019 05/25/2019 05/20/2018 05/19/2017  Decreased Interest 1 0 0 0 0  Down, Depressed, Hopeless 1 0 0 0 0  PHQ - 2 Score 2 0 0 0 0  Altered sleeping 2 - 0 0 -  Tired, decreased energy 3 - 0 0 -  Change in appetite 3 - 0 0 -  Feeling bad or failure about yourself  1 - 0 0 -  Trouble concentrating 0 - 0 0 -  Moving slowly or fidgety/restless 2 - 0 0 -  Suicidal thoughts 0 - 0 0 -  PHQ-9 Score 13 - 0 0 -  Difficult doing work/chores Somewhat difficult - Not difficult at all  Not difficult at all -   .. GAD 7 : Generalized Anxiety Score 03/30/2020 05/25/2019 05/20/2018  Nervous, Anxious, on Edge 2 0 0  Control/stop worrying 2 0 0  Worry too much - different things 2 0 0  Trouble relaxing 1 0 0  Restless 2 0 0  Easily annoyed or irritable 3 0 0  Afraid - awful might happen 2 0 0  Total GAD 7 Score 14 0 0  Anxiety Difficulty Somewhat difficult Not difficult at all Not difficult at all        Assessment & Plan:  [name] KitchenMarland KitchenEnoch was seen today for follow-up.  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[place]) -     COMPLETE METABOLIC PANEL WITH GFR -     TSH -     CBC with Differential/Platelet -     Lipid Panel w/reflex Direct LDL -     Sed Rate (ESR) -     Methotrexate level -     C-reactive protein -     Hemoglobin A1c  Medication management -     COMPLETE METABOLIC  PANEL WITH GFR -     TSH -     CBC with Differential/Platelet -     Lipid Panel w/reflex Direct LDL -     Sed Rate (ESR) -     Methotrexate level -     C-reactive protein -     Hemoglobin A1c  Chronic pain of both ankles  Ankle weakness  Adjustment disorder with depressed mood -     venlafaxine XR (EFFEXOR-XR) 37.5 MG 24 hr capsule; Take 1 capsule (37.5 mg total) by mouth daily with breakfast.   Labs repeated to compare to Dr. Valora Piccolo.  Refilled effexor. Follow up as needed or in 6 months.  Referral placed a few days ago for new rheumatologist to consider biologics to control his symptoms.  Placed in ASO brace to help with his ankle stabliity/weakness/swelling when he is more active.

## 2020-03-31 LAB — CBC WITH DIFFERENTIAL/PLATELET
Absolute Monocytes: 610 cells/uL (ref 200–950)
Basophils Absolute: 32 cells/uL (ref 0–200)
Basophils Relative: 0.3 %
Eosinophils Absolute: 75 cells/uL (ref 15–500)
Eosinophils Relative: 0.7 %
HCT: 45.6 % (ref 38.5–50.0)
Hemoglobin: 15.7 g/dL (ref 13.2–17.1)
Lymphs Abs: 1990 cells/uL (ref 850–3900)
MCH: 31.2 pg (ref 27.0–33.0)
MCHC: 34.4 g/dL (ref 32.0–36.0)
MCV: 90.7 fL (ref 80.0–100.0)
MPV: 11.1 fL (ref 7.5–12.5)
Monocytes Relative: 5.7 %
Neutro Abs: 7993 cells/uL — ABNORMAL HIGH (ref 1500–7800)
Neutrophils Relative %: 74.7 %
Platelets: 254 10*3/uL (ref 140–400)
RBC: 5.03 10*6/uL (ref 4.20–5.80)
RDW: 13.2 % (ref 11.0–15.0)
Total Lymphocyte: 18.6 %
WBC: 10.7 10*3/uL (ref 3.8–10.8)

## 2020-03-31 LAB — LIPID PANEL W/REFLEX DIRECT LDL
Cholesterol: 209 mg/dL — ABNORMAL HIGH (ref <200)
HDL: 38 mg/dL — ABNORMAL LOW (ref >=40)
LDL Cholesterol (Calc): 128 mg/dL (calc) — ABNORMAL HIGH
Non-HDL Cholesterol (Calc): 171 mg/dL (calc) — ABNORMAL HIGH (ref <130)
Total CHOL/HDL Ratio: 5.5 (calc) — ABNORMAL HIGH (ref <5.0)
Triglycerides: 304 mg/dL — ABNORMAL HIGH (ref <150)

## 2020-03-31 LAB — COMPLETE METABOLIC PANEL WITH GFR
AG Ratio: 1.9 (calc) (ref 1.0–2.5)
ALT: 31 U/L (ref 9–46)
AST: 18 U/L (ref 10–40)
Albumin: 4.5 g/dL (ref 3.6–5.1)
Alkaline phosphatase (APISO): 55 U/L (ref 36–130)
BUN: 19 mg/dL (ref 7–25)
CO2: 26 mmol/L (ref 20–32)
Calcium: 9.5 mg/dL (ref 8.6–10.3)
Chloride: 103 mmol/L (ref 98–110)
Creat: 0.79 mg/dL (ref 0.60–1.35)
GFR, Est African American: 143 mL/min/{1.73_m2} (ref >=60)
GFR, Est Non African American: 123 mL/min/{1.73_m2} (ref >=60)
Globulin: 2.4 g/dL (calc) (ref 1.9–3.7)
Glucose, Bld: 84 mg/dL (ref 65–139)
Potassium: 3.7 mmol/L (ref 3.5–5.3)
Sodium: 139 mmol/L (ref 135–146)
Total Bilirubin: 0.5 mg/dL (ref 0.2–1.2)
Total Protein: 6.9 g/dL (ref 6.1–8.1)

## 2020-03-31 LAB — HEMOGLOBIN A1C
Hgb A1c MFr Bld: 5.1 % of total Hgb (ref <5.7)
Mean Plasma Glucose: 100 (calc)
eAG (mmol/L): 5.5 (calc)

## 2020-03-31 LAB — METHOTREXATE LEVEL: Methotrexate: <0.2 umol/L

## 2020-03-31 LAB — SEDIMENTATION RATE: Sed Rate: 17 mm/h — ABNORMAL HIGH (ref 0–15)

## 2020-03-31 LAB — TSH: TSH: 1.08 mIU/L (ref 0.40–4.50)

## 2020-03-31 LAB — C-REACTIVE PROTEIN: CRP: 2.2 mg/L (ref <8.0)

## 2020-04-01 ENCOUNTER — Other Ambulatory Visit: Payer: Self-pay | Admitting: Physician Assistant

## 2020-04-01 DIAGNOSIS — E781 Pure hyperglyceridemia: Secondary | ICD-10-CM

## 2020-04-01 NOTE — Progress Notes (Signed)
Raymond Owens,   No diabetes.  CRP normal.  Inflammation rate a little high.  Kidney, liver look great.  Thyroid normal.  WBC much better.  TG are elevated. I would suggest Tricor to help lower TG. Ok with this? Recheck in 6 months.

## 2020-04-02 ENCOUNTER — Encounter: Payer: Self-pay | Admitting: Physician Assistant

## 2020-04-02 DIAGNOSIS — R29898 Other symptoms and signs involving the musculoskeletal system: Secondary | ICD-10-CM | POA: Insufficient documentation

## 2020-04-02 DIAGNOSIS — M25571 Pain in right ankle and joints of right foot: Secondary | ICD-10-CM | POA: Insufficient documentation

## 2020-04-03 ENCOUNTER — Encounter: Payer: Self-pay | Admitting: Nurse Practitioner

## 2020-04-03 ENCOUNTER — Encounter: Payer: Self-pay | Admitting: Physician Assistant

## 2020-04-05 ENCOUNTER — Encounter: Payer: Self-pay | Admitting: Physician Assistant

## 2020-04-09 MED ORDER — FENOFIBRATE 145 MG PO TABS
145.0000 mg | ORAL_TABLET | Freq: Every day | ORAL | 3 refills | Status: DC
Start: 2020-04-09 — End: 2020-06-01

## 2020-04-11 DIAGNOSIS — H5213 Myopia, bilateral: Secondary | ICD-10-CM | POA: Diagnosis not present

## 2020-05-28 DIAGNOSIS — M545 Low back pain, unspecified: Secondary | ICD-10-CM | POA: Diagnosis not present

## 2020-05-28 DIAGNOSIS — R5382 Chronic fatigue, unspecified: Secondary | ICD-10-CM | POA: Diagnosis not present

## 2020-05-28 DIAGNOSIS — R0781 Pleurodynia: Secondary | ICD-10-CM | POA: Diagnosis not present

## 2020-05-28 DIAGNOSIS — M2550 Pain in unspecified joint: Secondary | ICD-10-CM | POA: Diagnosis not present

## 2020-06-01 ENCOUNTER — Ambulatory Visit (INDEPENDENT_AMBULATORY_CARE_PROVIDER_SITE_OTHER): Payer: Medicaid Other

## 2020-06-01 ENCOUNTER — Other Ambulatory Visit: Payer: Self-pay

## 2020-06-01 ENCOUNTER — Encounter: Payer: Self-pay | Admitting: Physician Assistant

## 2020-06-01 ENCOUNTER — Other Ambulatory Visit: Payer: Self-pay | Admitting: Rheumatology

## 2020-06-01 ENCOUNTER — Ambulatory Visit (INDEPENDENT_AMBULATORY_CARE_PROVIDER_SITE_OTHER): Payer: Medicaid Other | Admitting: Physician Assistant

## 2020-06-01 ENCOUNTER — Other Ambulatory Visit: Payer: Medicaid Other

## 2020-06-01 VITALS — BP 120/60 | HR 86 | Ht 71.0 in | Wt 250.0 lb

## 2020-06-01 DIAGNOSIS — M79662 Pain in left lower leg: Secondary | ICD-10-CM

## 2020-06-01 DIAGNOSIS — R0609 Other forms of dyspnea: Secondary | ICD-10-CM

## 2020-06-01 DIAGNOSIS — F4321 Adjustment disorder with depressed mood: Secondary | ICD-10-CM | POA: Diagnosis not present

## 2020-06-01 DIAGNOSIS — R0602 Shortness of breath: Secondary | ICD-10-CM | POA: Diagnosis not present

## 2020-06-01 DIAGNOSIS — R0781 Pleurodynia: Secondary | ICD-10-CM

## 2020-06-01 DIAGNOSIS — M061 Adult-onset Still's disease: Secondary | ICD-10-CM | POA: Diagnosis not present

## 2020-06-01 DIAGNOSIS — M79605 Pain in left leg: Secondary | ICD-10-CM | POA: Diagnosis not present

## 2020-06-01 IMAGING — DX DG CHEST 2V
2 series · 2 of 2 positions shown · non-contrast
Comparison: [DATE]

CLINICAL DATA: Shortness of breath

EXAM:
CHEST - 2 VIEW

[chest pa]
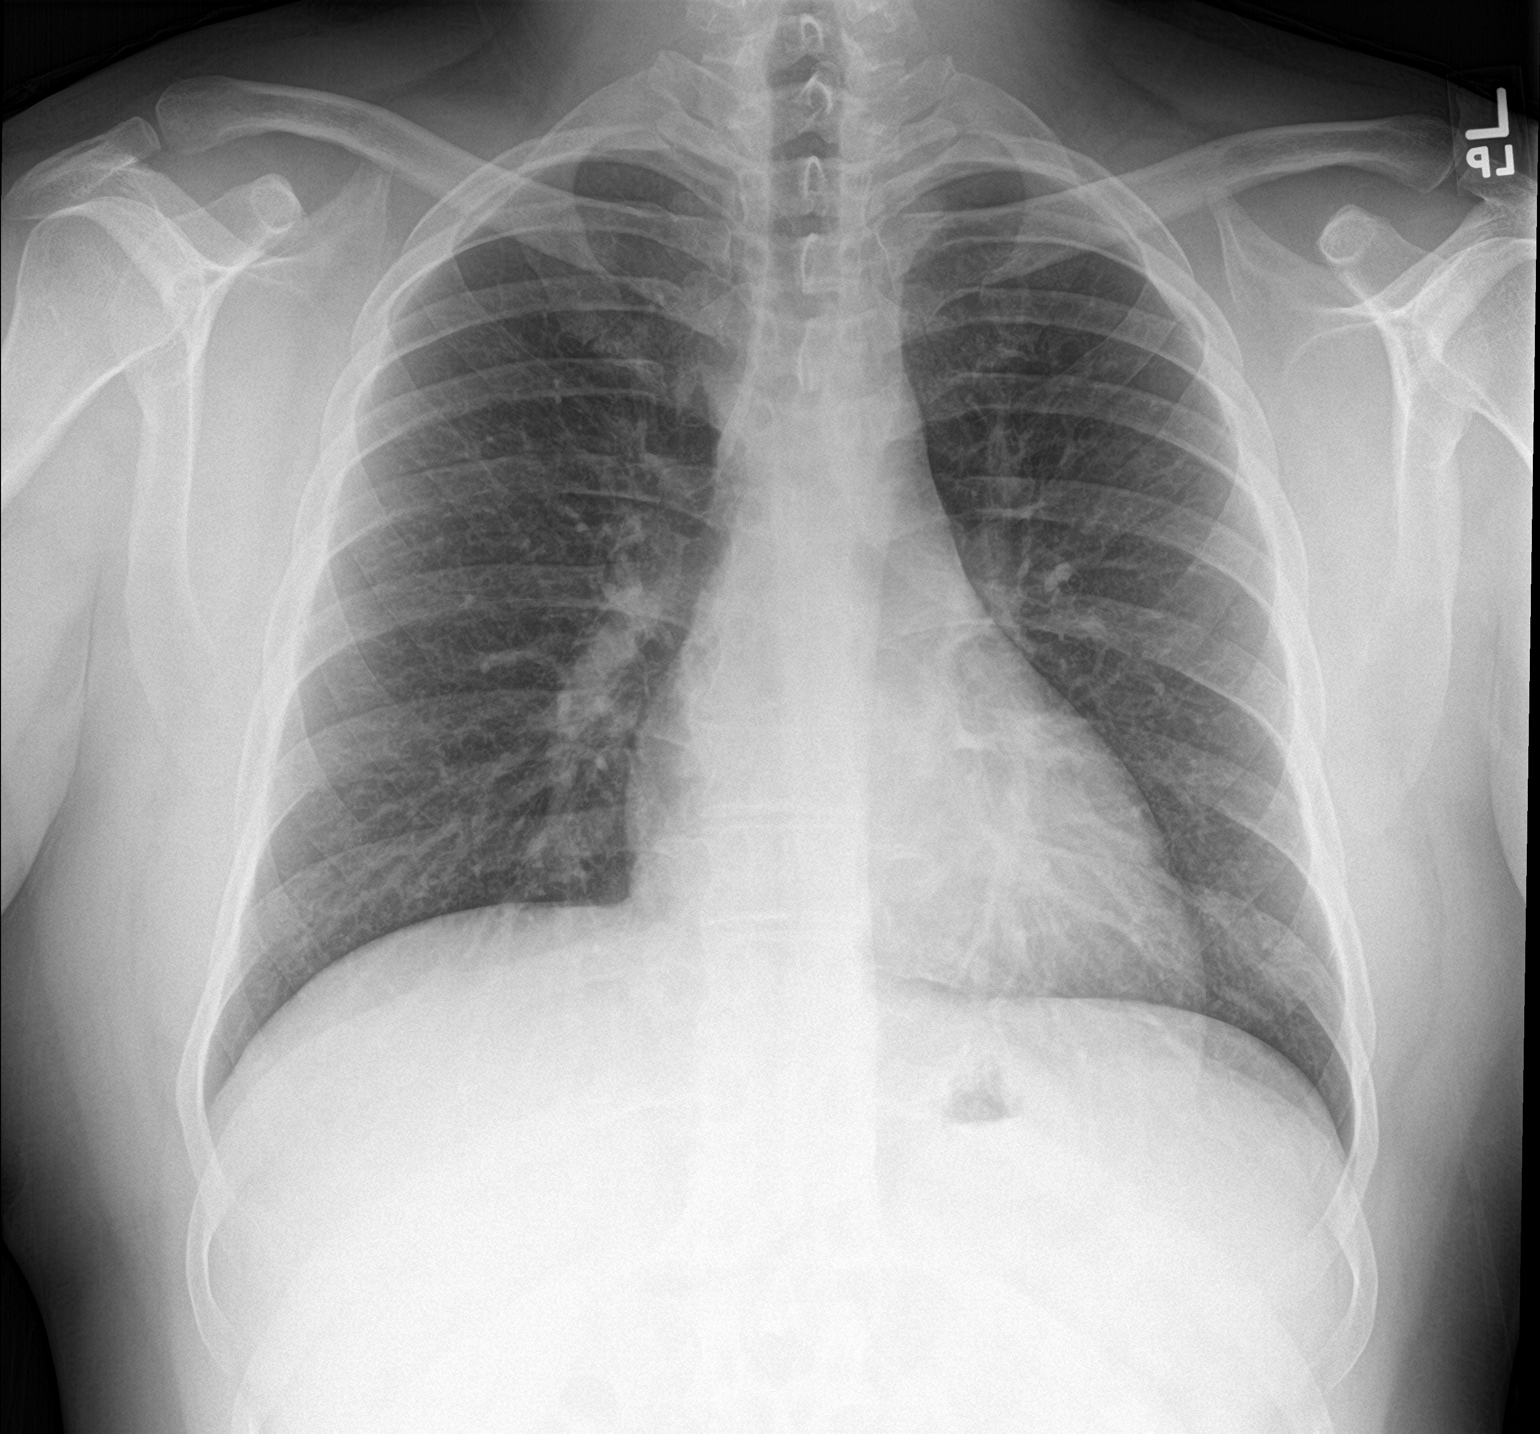

[chest lat]
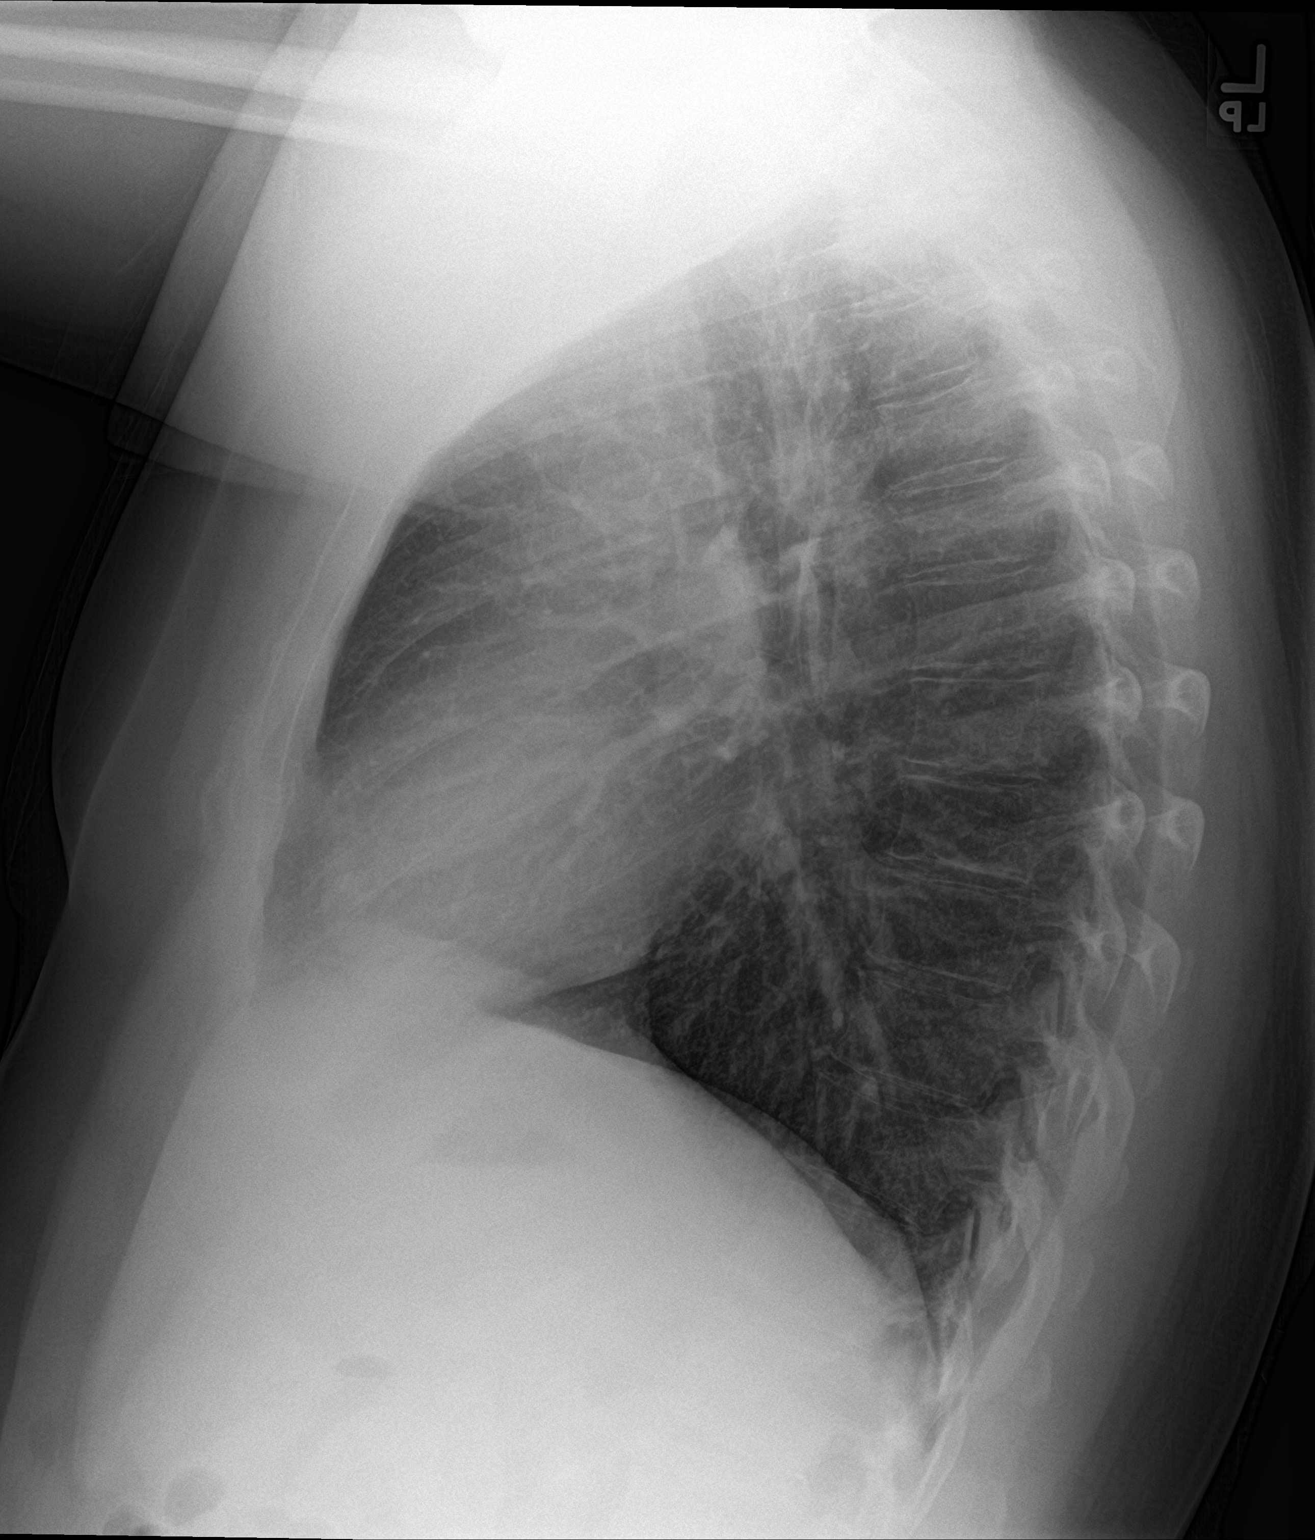

[2 of 2 positions shown; findings below may reference images not displayed]

FINDINGS: The heart size and mediastinal contours are within normal limits.
Both lungs are clear. The visualized skeletal structures are
unremarkable.
IMPRESSION: No active cardiopulmonary disease.

## 2020-06-01 IMAGING — US US EXTREM LOW VENOUS*L*
1 series · 13 of 24 positions shown · non-contrast
Comparison: None.

CLINICAL DATA: Left lower extremity pain for the past 2 weeks.
Evaluate for DVT.



[Series 1: us extrem low venous*left* · 0.08mm/px · 13 of 40 slices shown]
[im 1/40]
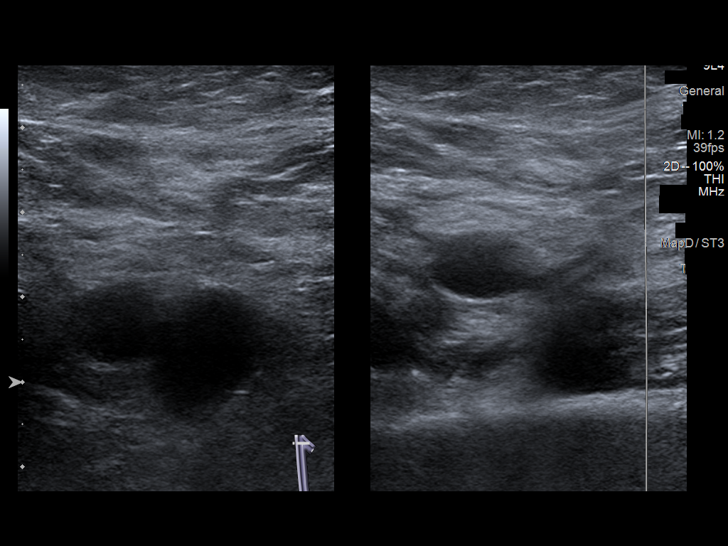
[im 4/40]
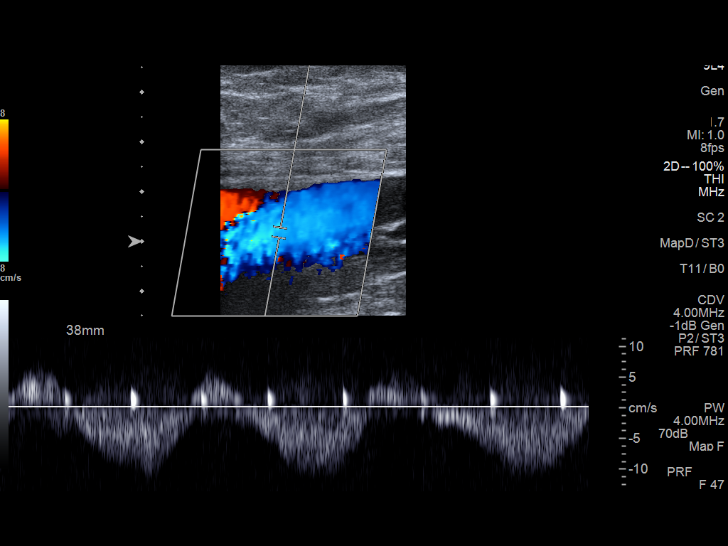
[im 7/40]
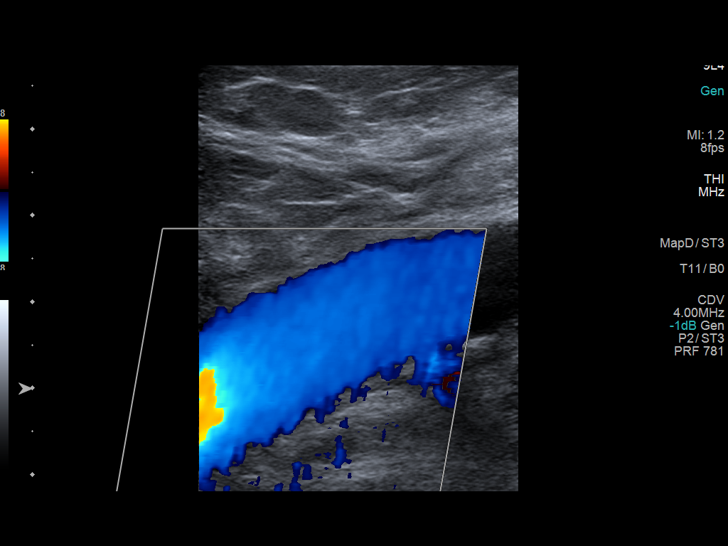
[im 11/40]
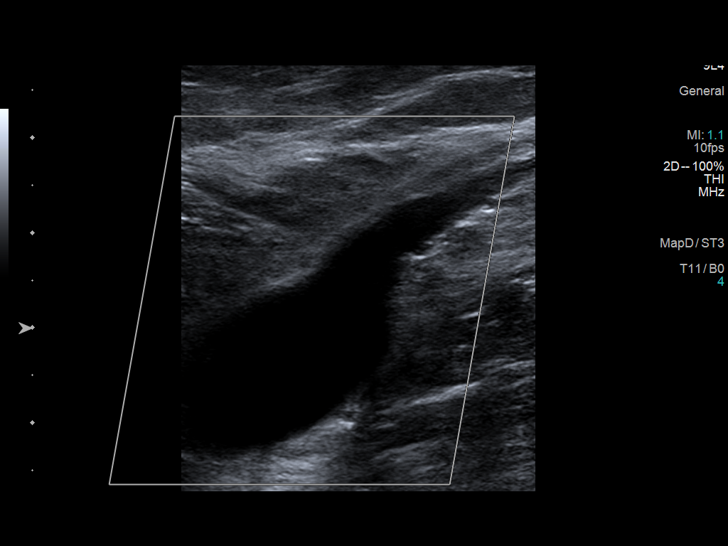
[im 14/40]
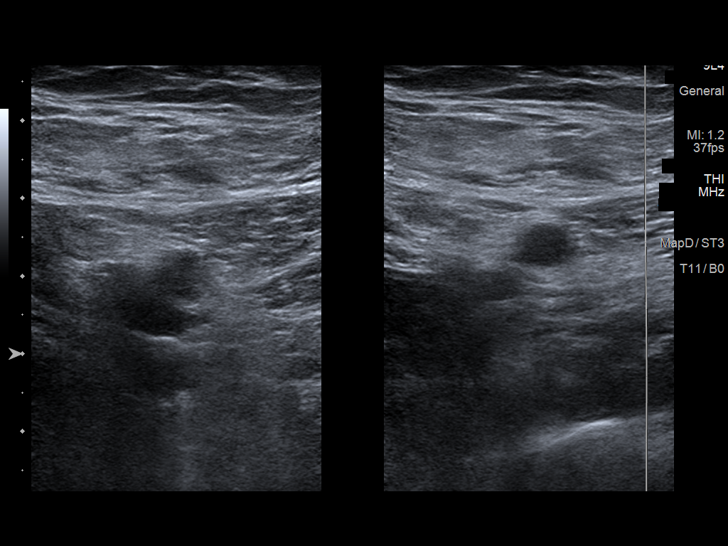
[im 17/40]
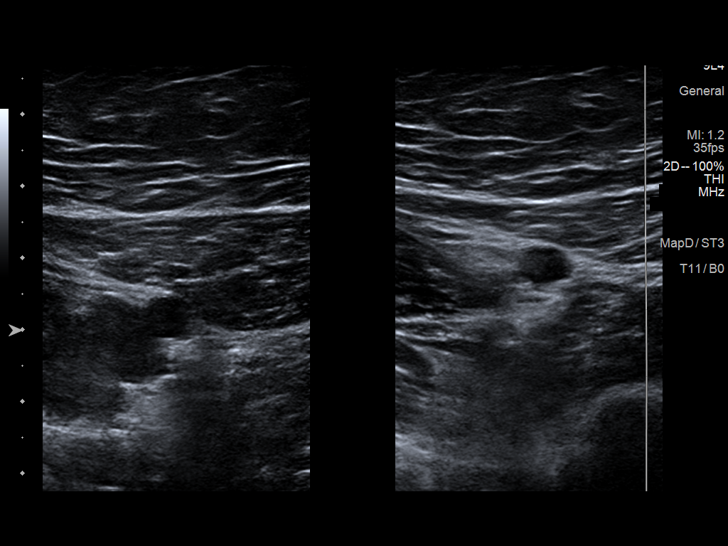
[im 21/40]
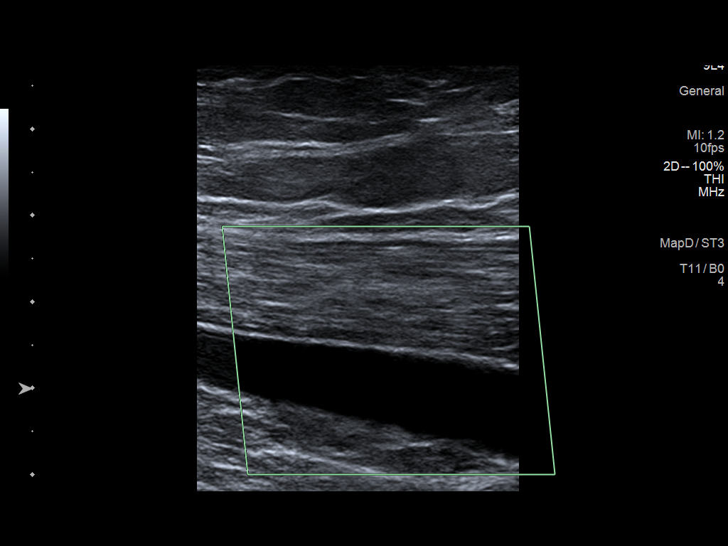
[im 23/40]
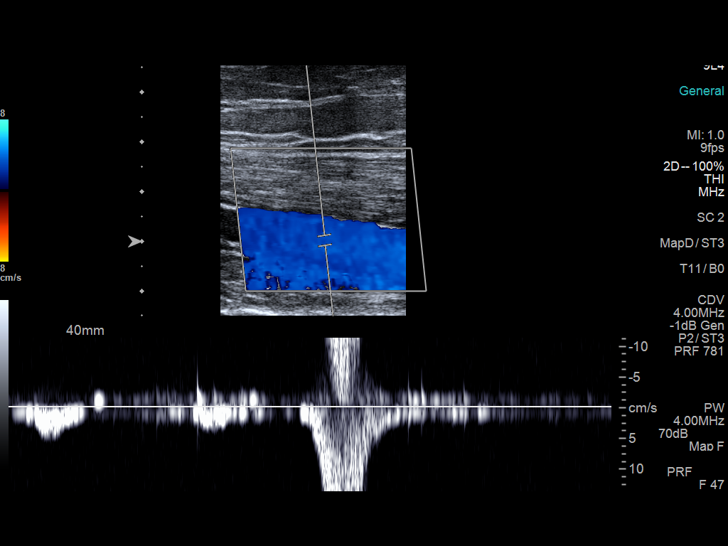
[im 26/40]
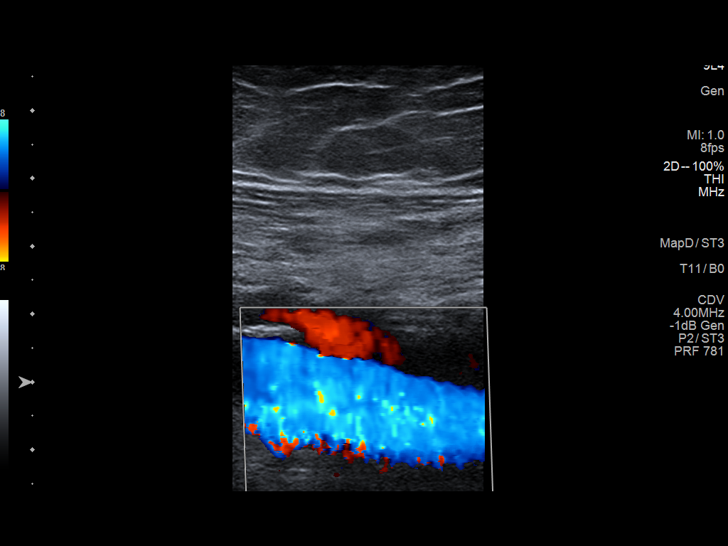
[im 29/40]
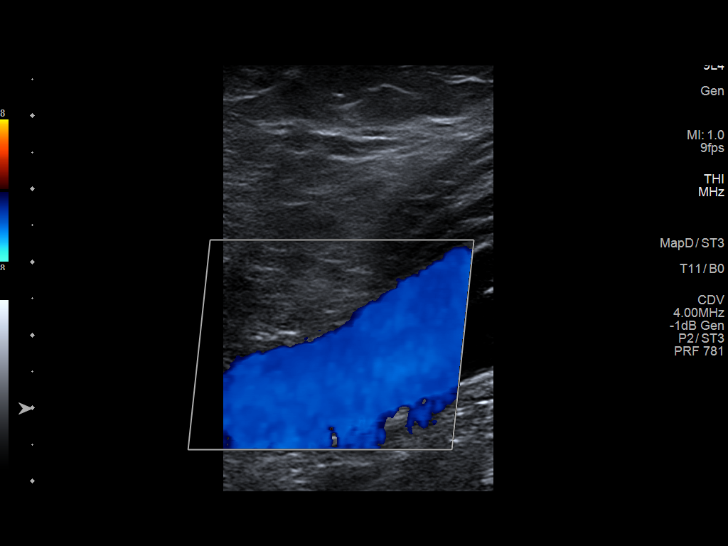
[im 33/40]
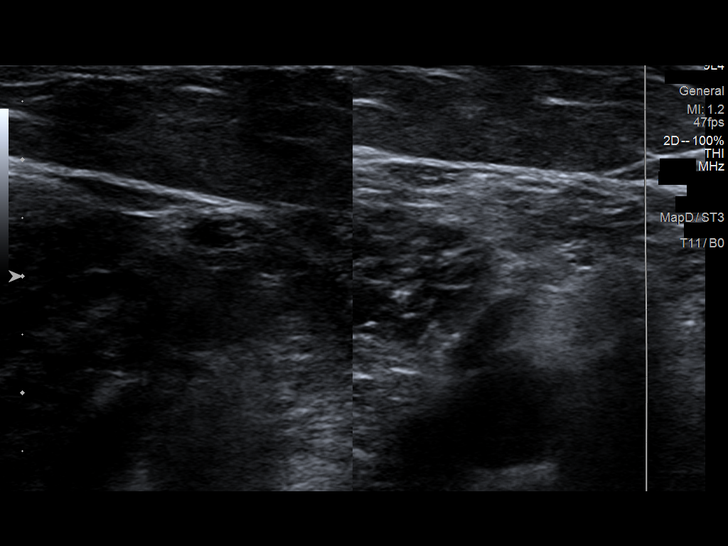
[im 36/40]
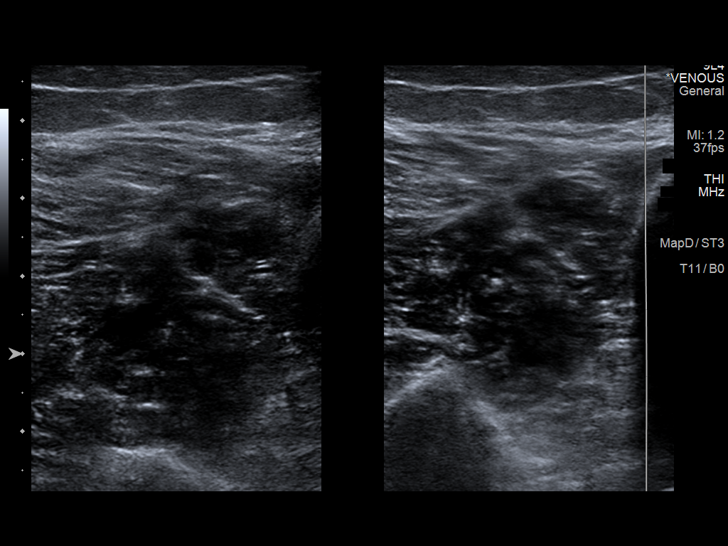
[im 40/40]
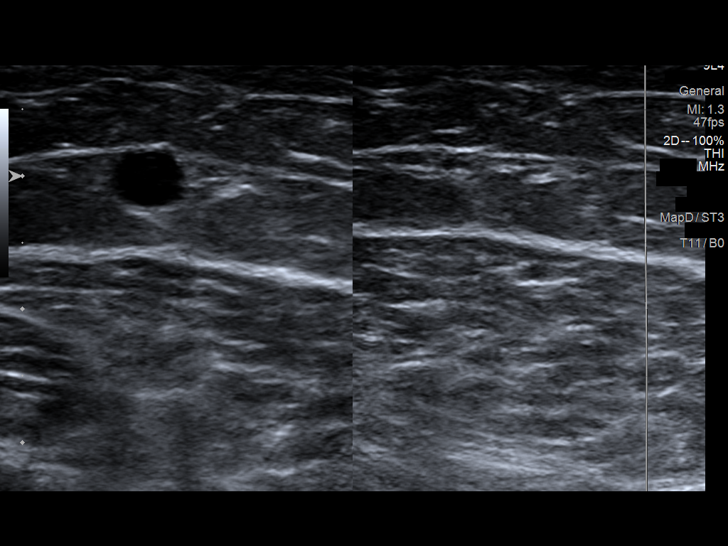

[13 of 24 positions shown; findings below may reference images not displayed]

FINDINGS: Contralateral Common Femoral Vein: Respiratory phasicity is normal
and symmetric with the symptomatic side. No evidence of thrombus.
Normal compressibility.

Common Femoral Vein: No evidence of thrombus. Normal
compressibility, respiratory phasicity and response to augmentation.

Saphenofemoral Junction: No evidence of thrombus. Normal
compressibility and flow on color Doppler imaging.

Profunda Femoral Vein: No evidence of thrombus. Normal
compressibility and flow on color Doppler imaging.

Femoral Vein: No evidence of thrombus. Normal compressibility,
respiratory phasicity and response to augmentation.

Popliteal Vein: No evidence of thrombus. Normal compressibility,
respiratory phasicity and response to augmentation.

Calf Veins: No evidence of thrombus. Normal compressibility and flow
on color Doppler imaging.

Superficial Great Saphenous Vein: No evidence of thrombus. Normal
compressibility.

Venous Reflux:  None.

Other Findings:  None.
IMPRESSION: No evidence of DVT within the left lower extremity.

## 2020-06-01 NOTE — Progress Notes (Signed)
NO DVT.

## 2020-06-01 NOTE — Progress Notes (Signed)
Subjective:    Patient ID: Raymond Owens, male    DOB: 08-Nov-1990, 29 y.o.   MRN: [id number]  HPI  Pt is a 29 yo male with Stills disease who presents to the clinic for follow up.   He did see rheumatology at [facility] on 10/4. No major abnormalities to labs. He was concerned with SOB on exertion. Wife on phone is concerned about a blood clot. He does have left calf pain. He has had bilateral calf pain but worse on left. He is SOB with any exertion. He did have CT in November which showed:he is just concerned with how tired he is.   IMPRESSION: 1. Examination is somewhat limited by breath motion artifact, particularly in the bilateral lower lobes. Within this limitation, there is no evidence of pulmonary embolism through the segmental pulmonary arterial level.  2. Extensive bilateral ground-glass and consolidative airspace opacity, which is new compared to prior examination. Findings are consistent with multifocal infection.  3. Diffuse interlobular septal thickening, similar to prior examination, and consistent with pulmonary edema.  4. Small bilateral pleural effusions, new compared to prior examination.  5.  Cardiomegaly and trace pericardial effusion.  .. Active Ambulatory Problems    Diagnosis Date Noted  . Ringworm 06/29/2016  . Hypertriglyceridemia 06/30/2016  . Chronic heel pain, left 05/20/2018  . Sepsis (HCC) 07/06/2019  . FUO (fever of unknown origin) 07/07/2019  . Headache 07/07/2019  . Rash 07/07/2019  . Acute respiratory failure with hypoxemia (HCC) 07/08/2019  . Abnormal liver function   . Aseptic meningitis   . Leukocytosis   . Multiple joint pain 07/20/2019  . Pneumonia of both lungs due to infectious organism 07/20/2019  . Vision loss, bilateral 07/25/2019  . Myalgia 07/25/2019  . Polyarthritis 07/25/2019  . 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 Double vision 08/05/2019  . Weakness 08/05/2019  . Adjustment disorder with depressed mood  08/05/2019  . Acute midline low back pain without sciatica 08/05/2019  . Acute midline thoracic back pain 10/14/2019  . Ankle weakness 04/02/2020  . Chronic pain of both ankles 04/02/2020  . Chronic dyspnea 06/01/2020  . Pain of left calf 06/01/2020   Resolved Ambulatory Problems    Diagnosis Date Noted  . Bacterial meningitis 07/08/2019   Past Medical History:  Diagnosis Date  . Hyperlipidemia   . Still's disease [facility])     Review of Systems  All other systems reviewed and are negative.      Objective:   Physical Exam Vitals reviewed.  Constitutional:      Appearance: Normal appearance.  HENT:     Head: Normocephalic.  Cardiovascular:     Rate and Rhythm: Normal rate.  Pulmonary:     Effort: Pulmonary effort is normal.  Musculoskeletal:     Right lower leg: No edema.     Left lower leg: No edema.     Comments: Pain with palpation of left calf. No redness or swelling.   Neurological:     General: No focal deficit present.     Mental Status: He is alert and oriented to person, place, and time.  Psychiatric:        Mood and Affect: Mood normal.        Behavior: Behavior normal.           Assessment & Plan:  [name] KitchenMarland KitchenTsutomu was seen today for follow-up.  Diagnoses and all orders for this visit:  Adult-onset Still's disease [facility]) -     Ambulatory referral to Pulmonology  Adjustment disorder with depressed mood  Chronic dyspnea -     CT Chest Wo Contrast -     Ambulatory referral to Pulmonology  Pain of left calf -     US Venous Img Lower Unilateral Left   CT obtained to evaluate worsening chronic SOB and ground glass opacities seen on CT scan November 2020. Made referral to pulmonology.   Left venous ultrasound done to rule out DVT.   Certainly mood is not at goal. Increased effexor to 75mg  daily by taking 2 of the 37.5mg . report back in 3 weeks. If doing well will send refills.

## 2020-06-06 ENCOUNTER — Ambulatory Visit (INDEPENDENT_AMBULATORY_CARE_PROVIDER_SITE_OTHER): Payer: Medicaid Other

## 2020-06-06 ENCOUNTER — Other Ambulatory Visit: Payer: Self-pay

## 2020-06-06 DIAGNOSIS — R0602 Shortness of breath: Secondary | ICD-10-CM

## 2020-06-06 IMAGING — CT CT CHEST W/O CM
2 of 4 series · 15 of 36 positions shown, 18 images · non-contrast
Comparison: [DATE].

CLINICAL DATA: Chronic shortness of breath with recent worsening.
Left calf pain.

EXAM:
CT CHEST WITHOUT CONTRAST
TECHNIQUE: Multidetector CT imaging of the chest was performed following the
standard protocol without IV contrast.

[Series 4: lungs · axial · 0.77mm/px · z∈[-442,-120]mm · 12 of 181 slices shown, 15 images]
[im 10/181  mediastinal]
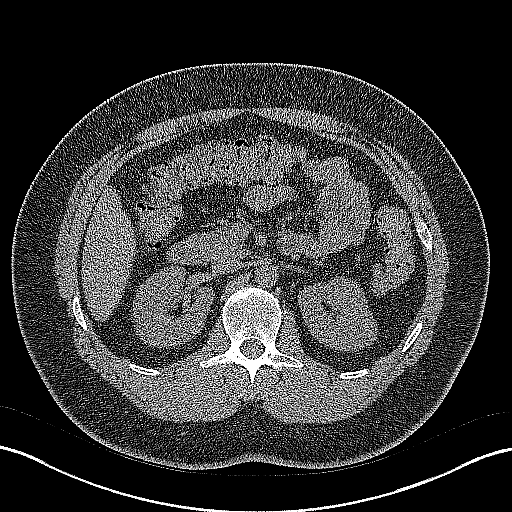
[im 10/181  lung]
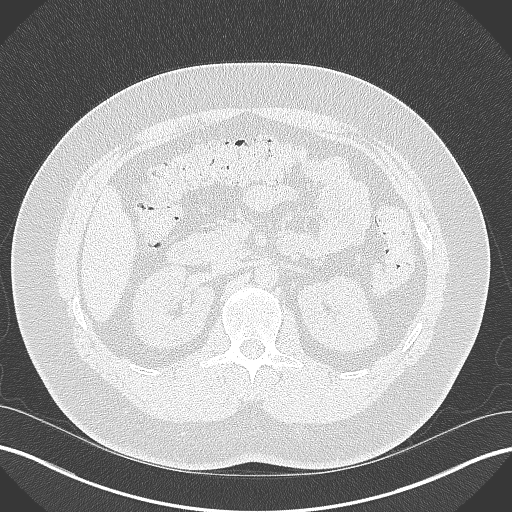
[im 29/181  lung]
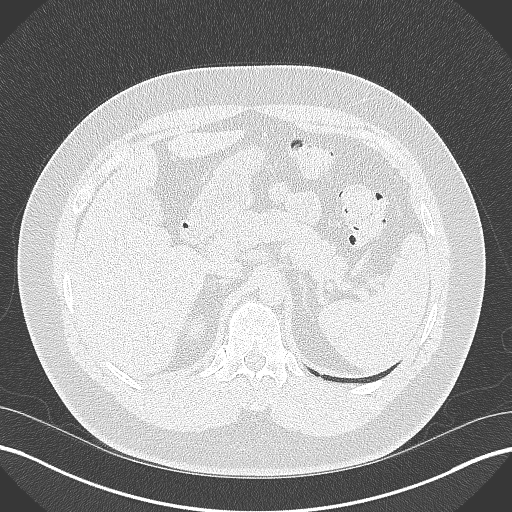
[im 38/181  lung]
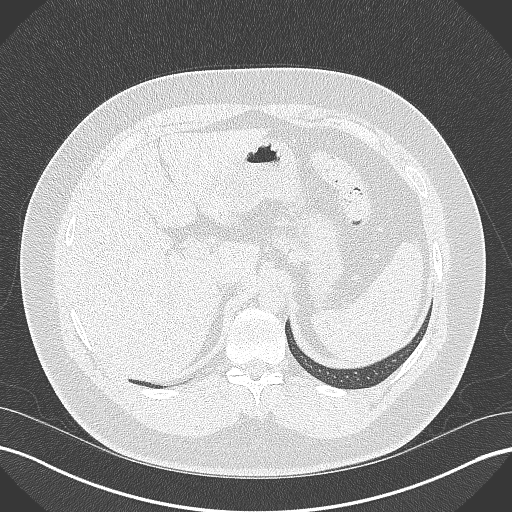
[im 57/181  lung]
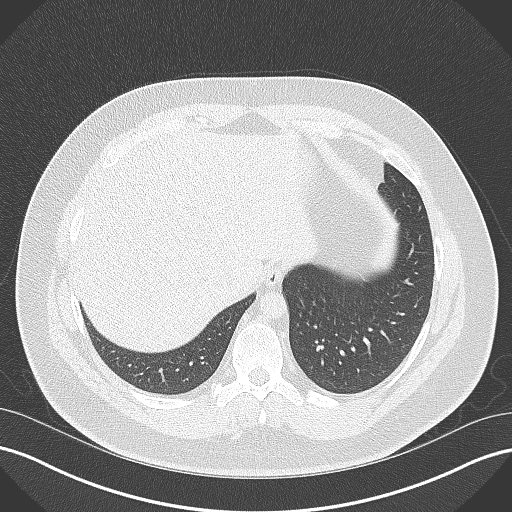
[im 67/181  mediastinal]
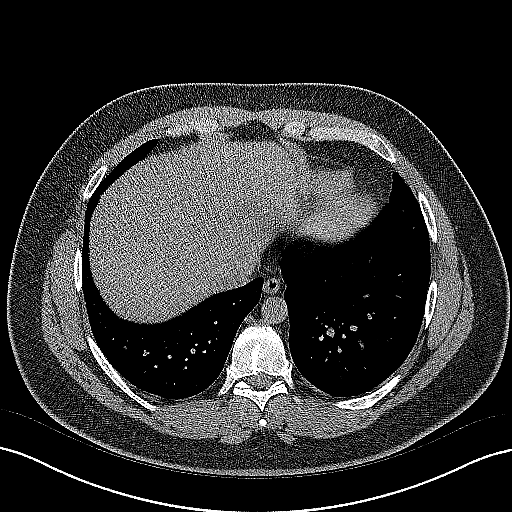
[im 67/181  lung]
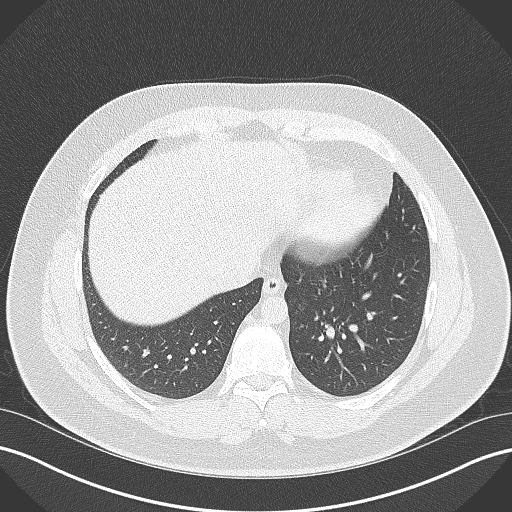
[im 86/181  lung]
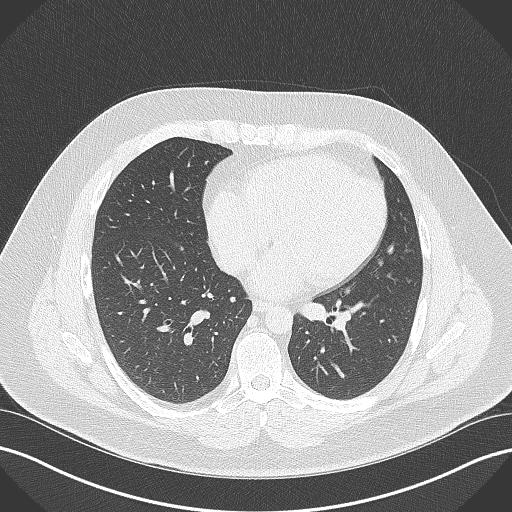
[im 95/181  lung]
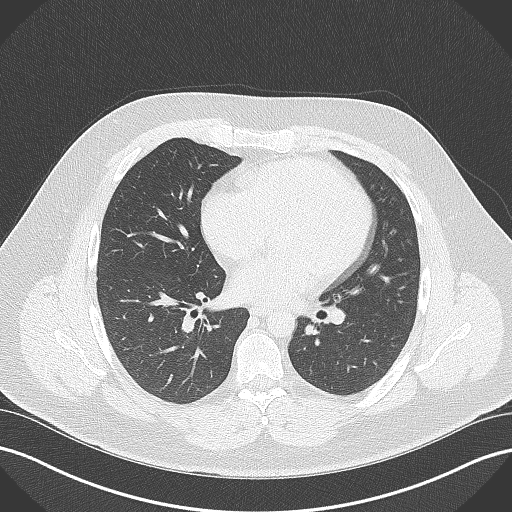
[im 114/181  lung]
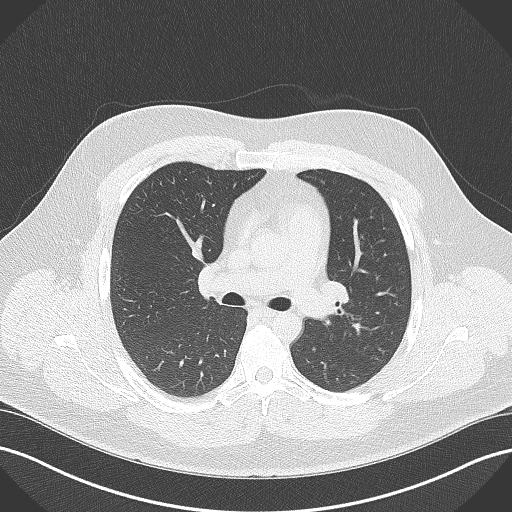
[im 124/181  mediastinal]
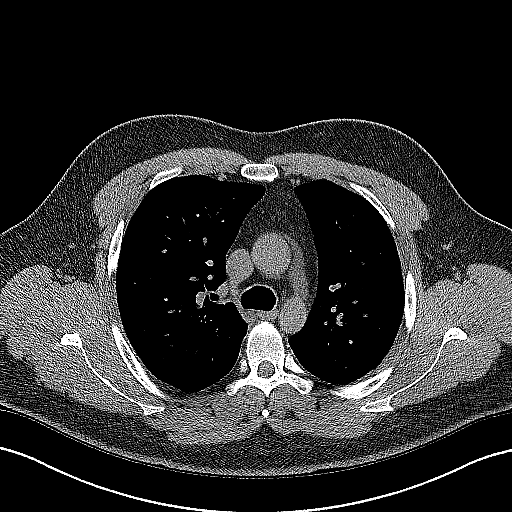
[im 124/181  lung]
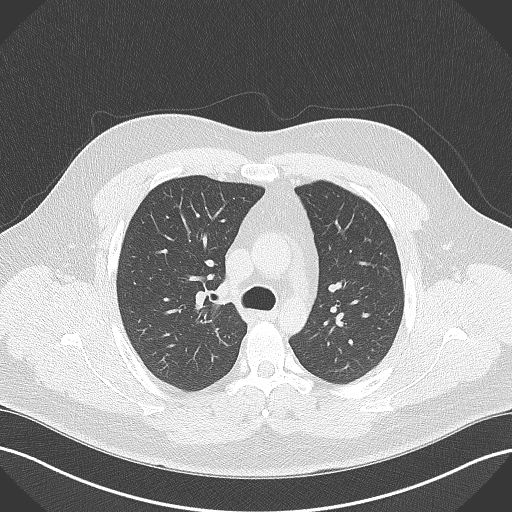
[im 143/181  lung]
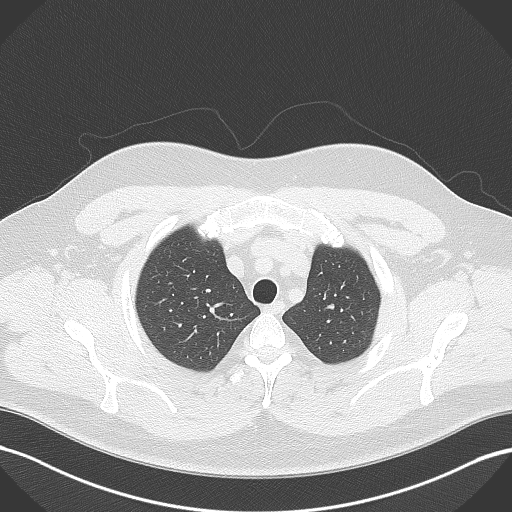
[im 152/181  lung]
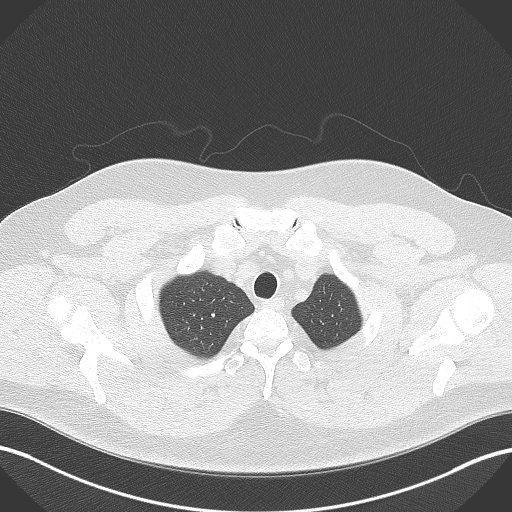
[im 171/181  lung]
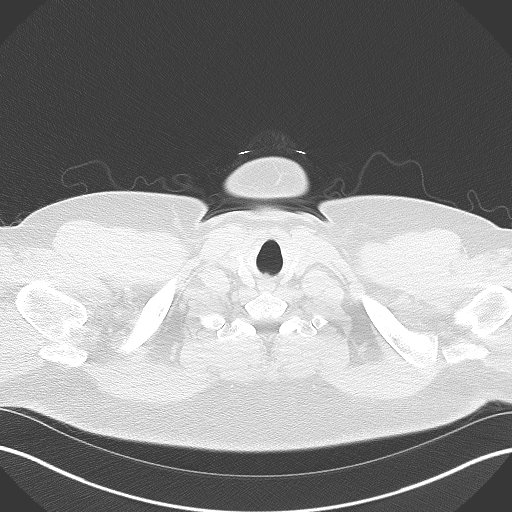

[Series 5: coronal · coronal · 0.70mm/px · 3 of 139 slices shown]
[im 28/139  lung]
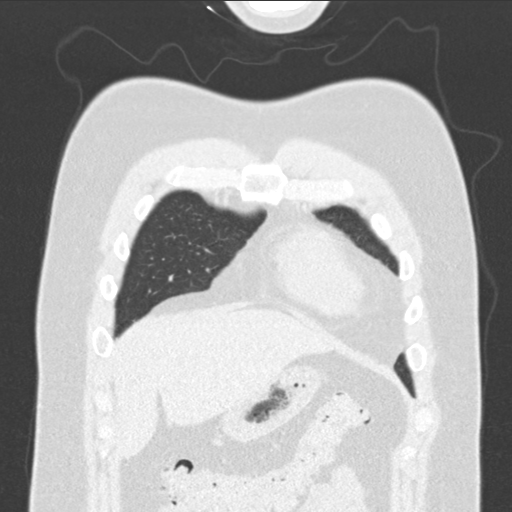
[im 56/139  lung]
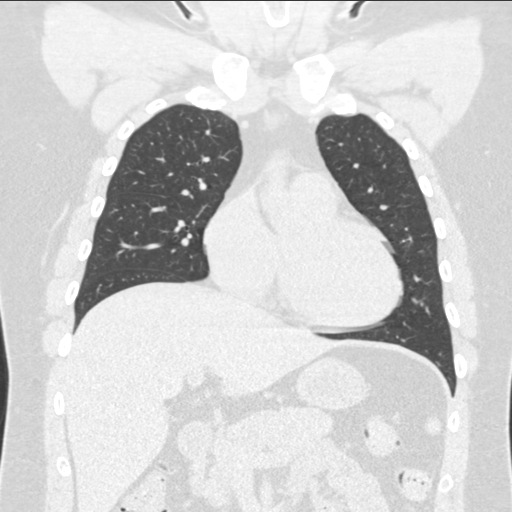
[im 83/139  lung]
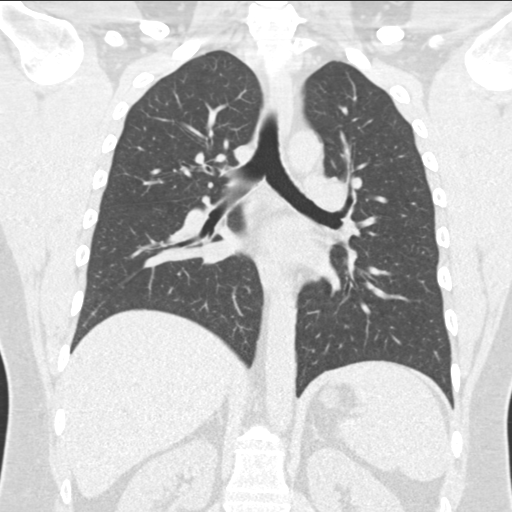

[15 of 36 positions shown; findings below may reference images not displayed]

FINDINGS: Cardiovascular: Heart is mildly enlarged.  No pericardial effusion.

Mediastinum/Nodes: No pathologically enlarged mediastinal or
axillary lymph nodes. Hilar regions are difficult to definitively
evaluate without IV contrast but appear grossly unremarkable.
Esophagus is grossly unremarkable.

Lungs/Pleura: Lungs are clear. No pleural fluid. Airway is
unremarkable.

Upper Abdomen: Visualized portions of the liver, gallbladder,
adrenal glands, kidneys, spleen, pancreas, stomach and bowel are
grossly unremarkable. No upper abdominal adenopathy.

Musculoskeletal: Minimal endplate degenerative changes.
IMPRESSION: No findings to explain the patient's clinical history.

## 2020-06-06 NOTE — Progress Notes (Signed)
CT normal. Lungs are clear. No abnormality.

## 2020-07-19 ENCOUNTER — Encounter: Payer: Self-pay | Admitting: Physician Assistant

## 2020-07-19 DIAGNOSIS — M061 Adult-onset Still's disease: Secondary | ICD-10-CM

## 2020-07-23 MED ORDER — METHOTREXATE 2.5 MG PO TABS: 15.0000 mg | ORAL_TABLET | ORAL | 0 refills | Status: DC

## 2020-08-28 ENCOUNTER — Encounter: Payer: Self-pay | Admitting: Physician Assistant

## 2020-08-28 ENCOUNTER — Telehealth: Payer: Self-pay

## 2020-08-28 ENCOUNTER — Other Ambulatory Visit: Payer: Self-pay | Admitting: Physician Assistant

## 2020-08-28 DIAGNOSIS — M061 Adult-onset Still's disease: Secondary | ICD-10-CM

## 2020-08-28 DIAGNOSIS — F4321 Adjustment disorder with depressed mood: Secondary | ICD-10-CM

## 2020-08-28 MED ORDER — VENLAFAXINE HCL ER 75 MG PO CP24
75.0000 mg | ORAL_CAPSULE | Freq: Every day | ORAL | 1 refills | Status: DC
Start: 2020-08-28 — End: 2020-10-31

## 2020-08-28 NOTE — Telephone Encounter (Signed)
75mg  of effexor sent to pharmacy for a 90 day supply.

## 2020-08-28 NOTE — Telephone Encounter (Signed)
Cloria Spring, Raymond Owens's wife, called and states he is out of his medication due to the increase to 75 mg. He was to take 2 of the 37.5 mg and report back in three weeks. His wife is upset that his has ran out of his medication. She states he has been in bed all day because he has been out of medication for 2 days.   She states his is not be held responsible for not responding back in 3 weeks. She states can't take care of himself.

## 2020-08-29 MED ORDER — METHOTREXATE 2.5 MG PO TABS: 20.0000 mg | ORAL_TABLET | ORAL | 0 refills | Status: DC

## 2020-08-29 NOTE — Telephone Encounter (Signed)
Patient aware prescription has been sent and has already picked up.

## 2020-09-03 ENCOUNTER — Encounter: Payer: Self-pay | Admitting: Physician Assistant

## 2020-09-03 NOTE — Telephone Encounter (Signed)
I reached out to patient to schedule a virtual visit & patient declined as he said he does not think Medicaid approves virtual visits and he said he would send PCP a mychart message himself. AM

## 2020-09-04 ENCOUNTER — Ambulatory Visit: Payer: Medicaid Other | Admitting: Physician Assistant

## 2020-09-05 ENCOUNTER — Encounter: Payer: Self-pay | Admitting: Physician Assistant

## 2020-09-05 NOTE — Telephone Encounter (Signed)
fyi

## 2020-09-05 NOTE — Telephone Encounter (Signed)
See note

## 2020-09-14 ENCOUNTER — Ambulatory Visit (INDEPENDENT_AMBULATORY_CARE_PROVIDER_SITE_OTHER): Payer: Medicaid Other | Admitting: Physician Assistant

## 2020-09-14 ENCOUNTER — Encounter: Payer: Self-pay | Admitting: Physician Assistant

## 2020-09-14 ENCOUNTER — Other Ambulatory Visit: Payer: Self-pay

## 2020-09-14 VITALS — BP 129/75 | HR 92 | Wt 239.0 lb

## 2020-09-14 DIAGNOSIS — F4321 Adjustment disorder with depressed mood: Secondary | ICD-10-CM | POA: Diagnosis not present

## 2020-09-14 DIAGNOSIS — M25551 Pain in right hip: Secondary | ICD-10-CM | POA: Diagnosis not present

## 2020-09-14 DIAGNOSIS — M061 Adult-onset Still's disease: Secondary | ICD-10-CM

## 2020-09-14 DIAGNOSIS — Z79899 Other long term (current) drug therapy: Secondary | ICD-10-CM | POA: Diagnosis not present

## 2020-09-14 DIAGNOSIS — G894 Chronic pain syndrome: Secondary | ICD-10-CM | POA: Insufficient documentation

## 2020-09-14 DIAGNOSIS — R0602 Shortness of breath: Secondary | ICD-10-CM

## 2020-09-14 DIAGNOSIS — M2550 Pain in unspecified joint: Secondary | ICD-10-CM

## 2020-09-14 DIAGNOSIS — G8929 Other chronic pain: Secondary | ICD-10-CM | POA: Diagnosis not present

## 2020-09-14 MED ORDER — TRAMADOL HCL 50 MG PO TABS: 50.0000 mg | ORAL_TABLET | Freq: Four times a day (QID) | ORAL | 0 refills | Status: AC | PRN

## 2020-09-14 NOTE — Progress Notes (Signed)
Subjective:    Patient ID: Raymond Owens, male    DOB: 11/18/1990, 30 y.o.   MRN: [id number]  HPI  Patient is a 30 year old male with adult stills disease and arthralgia who presents to the clinic for follow-up.  Patient is following for disability because he cannot keep his job and keep working.  [employer] is working with him and it is a fairly easy job but he does not have a physical stamina to keep up.  He is seeing rheumatology at [facility] and they did increase his methotrexate to 20 mg and prednisone decreased to 9 mg.  He is not having much improvement with this dose.  On the lower milligram of prednisone his vision has improved.  Most all of his joints and spine a but he is having more pain in his right hip.  This is been ongoing since the summer.  He did have a fall back in summer 2021.  He went to the ED and had sprained his ankle.  X-rays of right hip were unremarkable.  He has had pain since.  At times it feels like it is locking up.  There is pain with external range of motion.   Overall his mood is okay.  His chronic pain has him depressed some days.  He has 3 young children at home and he feels like he is not very useful.  He denies any suicidal thoughts or homicidal idealizations.  Effexor 75 mg is helping.  .. Active Ambulatory Problems    Diagnosis Date Noted  . Ringworm 06/29/2016  . Hypertriglyceridemia 06/30/2016  . Chronic heel pain, left 05/20/2018  . Sepsis (HCC) 07/06/2019  . FUO (fever of unknown origin) 07/07/2019  . Headache 07/07/2019  . Rash 07/07/2019  . Acute respiratory failure with hypoxemia (HCC) 07/08/2019  . Abnormal liver function   . Aseptic meningitis   . Leukocytosis   . Multiple joint pain 07/20/2019  . Pneumonia of both lungs due to infectious organism 07/20/2019  . Vision loss, bilateral 07/25/2019  . Myalgia 07/25/2019  . Polyarthritis 07/25/2019  . 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 Double vision 08/05/2019  . Weakness 08/05/2019   . Adjustment disorder with depressed mood 08/05/2019  . Acute midline low back pain without sciatica 08/05/2019  . Acute midline thoracic back pain 10/14/2019  . Ankle weakness 04/02/2020  . Chronic pain of both ankles 04/02/2020  . Chronic dyspnea 06/01/2020  . Pain of left calf 06/01/2020  . Chronic pain syndrome 09/14/2020  . Chronic right hip pain 09/18/2020   Resolved Ambulatory Problems    Diagnosis Date Noted  . Bacterial meningitis 07/08/2019   Past Medical History:  Diagnosis Date  . Hyperlipidemia   . Still's disease (HCC)       Review of Systems See HPI.     Objective:   Physical Exam Vitals reviewed.  Constitutional:      Appearance: Normal appearance.  Cardiovascular:     Rate and Rhythm: Normal rate and regular rhythm.     Pulses: Normal pulses.     Heart sounds: Normal heart sounds.  Pulmonary:     Effort: Pulmonary effort is normal.     Breath sounds: Normal breath sounds.  Musculoskeletal:     Right lower leg: No edema.     Left lower leg: No edema.     Comments: Pain with ROM of right hip.  Strength of lower and upper ext decreased 3-4/5.  Slow to change positions.  Gait with limp to  the right.  Easily fatigued   Neurological:     General: No focal deficit present.     Mental Status: He is alert and oriented to person, place, and time.  Psychiatric:        Mood and Affect: Mood normal.           Assessment & Plan:  [name] KitchenMarland Kitchen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HCC) -     Methotrexate level -     CBC with Differential/Platelet -     COMPLETE METABOLIC PANEL WITH GFR -     Hepatic function panel -     traMADol (ULTRAM) 50 MG tablet; Take 1 tablet (50 mg total) by mouth every 6 (six) hours as needed for up to 5 days. -     C-reactive protein -     Cane adjustable single point  Medication management -     Methotrexate level -     CBC with Differential/Platelet -     COMPLETE METABOLIC PANEL WITH GFR -      Hepatic function panel -     C-reactive protein  Arthralgia, unspecified joint -     traMADol (ULTRAM) 50 MG tablet; Take 1 tablet (50 mg total) by mouth every 6 (six) hours as needed for up to 5 days. -     C-reactive protein -     Cane adjustable single point  Chronic pain syndrome -     traMADol (ULTRAM) 50 MG tablet; Take 1 tablet (50 mg total) by mouth every 6 (six) hours as needed for up to 5 days. -     Ambulatory referral to Psychology  Chronic right hip pain -     Cane adjustable single point  Adjustment disorder with depressed mood -     Ambulatory referral to Psychology   I do not think patient can work in this state. He gets fatigued easily and the ongoing pain creates a cognitive dysfunction.   Paperwork filled out in office.   Ordered a cane to help with mobility.   Get labs and fax to [facility]umat[facility].    Continue on effexor for mood/pain. Consider adding gabapentin/lyrica. Continue prednisone and methotrexate. Added tramadol for break through pain. Discussed use as needed and is an dependence risk.   Right hip xray did not show any acute abnormalities. Will get MRI.   Ok to schedule for spirometry in office to access lung function.     Spent 40 minutes with patient filling out Disability Forms,testing functionality, discussing plan for future.   Follow up 3-6 months.

## 2020-09-14 NOTE — Patient Instructions (Signed)
Get labs Will get MRI of right hip  Fax paperwork.  Tramadol for break through pain.

## 2020-09-15 LAB — C-REACTIVE PROTEIN: CRP: 2.6 mg/L (ref <8.0)

## 2020-09-17 ENCOUNTER — Encounter: Payer: Self-pay | Admitting: Physician Assistant

## 2020-09-17 DIAGNOSIS — G8929 Other chronic pain: Secondary | ICD-10-CM

## 2020-09-17 DIAGNOSIS — M25551 Pain in right hip: Secondary | ICD-10-CM

## 2020-09-17 LAB — CBC WITH DIFFERENTIAL/PLATELET
Absolute Monocytes: 424 cells/uL (ref 200–950)
Basophils Absolute: 42 cells/uL (ref 0–200)
Basophils Relative: 0.4 %
Eosinophils Absolute: 64 cells/uL (ref 15–500)
Eosinophils Relative: 0.6 %
HCT: 49.6 % (ref 38.5–50.0)
Hemoglobin: 16.9 g/dL (ref 13.2–17.1)
Lymphs Abs: 1484 cells/uL (ref 850–3900)
MCH: 31.2 pg (ref 27.0–33.0)
MCHC: 34.1 g/dL (ref 32.0–36.0)
MCV: 91.5 fL (ref 80.0–100.0)
MPV: 11 fL (ref 7.5–12.5)
Monocytes Relative: 4 %
Neutro Abs: 8586 cells/uL — ABNORMAL HIGH (ref 1500–7800)
Neutrophils Relative %: 81 %
Platelets: 373 10*3/uL (ref 140–400)
RBC: 5.42 10*6/uL (ref 4.20–5.80)
RDW: 12.5 % (ref 11.0–15.0)
Total Lymphocyte: 14 %
WBC: 10.6 10*3/uL (ref 3.8–10.8)

## 2020-09-17 LAB — COMPLETE METABOLIC PANEL WITH GFR
AG Ratio: 1.5 (calc) (ref 1.0–2.5)
ALT: 50 U/L — ABNORMAL HIGH (ref 9–46)
AST: 25 U/L (ref 10–40)
Albumin: 4.7 g/dL (ref 3.6–5.1)
Alkaline phosphatase (APISO): 74 U/L (ref 36–130)
BUN: 15 mg/dL (ref 7–25)
CO2: 29 mmol/L (ref 20–32)
Calcium: 10 mg/dL (ref 8.6–10.3)
Chloride: 101 mmol/L (ref 98–110)
Creat: 0.79 mg/dL (ref 0.60–1.35)
GFR, Est African American: 142 mL/min/{1.73_m2} (ref >=60)
GFR, Est Non African American: 122 mL/min/{1.73_m2} (ref >=60)
Globulin: 3.2 g/dL (calc) (ref 1.9–3.7)
Glucose, Bld: 93 mg/dL (ref 65–99)
Potassium: 4.3 mmol/L (ref 3.5–5.3)
Sodium: 137 mmol/L (ref 135–146)
Total Bilirubin: 0.4 mg/dL (ref 0.2–1.2)
Total Protein: 7.9 g/dL (ref 6.1–8.1)

## 2020-09-17 LAB — METHOTREXATE LEVEL: Methotrexate: <0.2 umol/L

## 2020-09-17 LAB — HEPATIC FUNCTION PANEL
AG Ratio: 1.5 (calc) (ref 1.0–2.5)
ALT: 50 U/L — ABNORMAL HIGH (ref 9–46)
AST: 25 U/L (ref 10–40)
Albumin: 4.7 g/dL (ref 3.6–5.1)
Alkaline phosphatase (APISO): 74 U/L (ref 36–130)
Bilirubin, Direct: 0.1 mg/dL (ref 0.0–0.2)
Globulin: 3.2 g/dL (calc) (ref 1.9–3.7)
Indirect Bilirubin: 0.3 mg/dL (calc) (ref 0.2–1.2)
Total Bilirubin: 0.4 mg/dL (ref 0.2–1.2)
Total Protein: 7.9 g/dL (ref 6.1–8.1)

## 2020-09-17 NOTE — Progress Notes (Signed)
[name],   CRP staying nice and low.  Liver function up but stable.  Kidney function normal.  Glucose normal.  Neutrophils up but to be expected due to the prednisone you are on.    Pending methotrexate level.   Please fax labs to [facility] rheumatology.

## 2020-09-18 ENCOUNTER — Telehealth: Payer: Self-pay | Admitting: Neurology

## 2020-09-18 ENCOUNTER — Encounter: Payer: Self-pay | Admitting: Physician Assistant

## 2020-09-18 DIAGNOSIS — G8929 Other chronic pain: Secondary | ICD-10-CM | POA: Insufficient documentation

## 2020-09-18 DIAGNOSIS — R0602 Shortness of breath: Secondary | ICD-10-CM | POA: Insufficient documentation

## 2020-09-18 DIAGNOSIS — S73191A Other sprain of right hip, initial encounter: Secondary | ICD-10-CM | POA: Insufficient documentation

## 2020-09-18 NOTE — Telephone Encounter (Signed)
Disability paperwork, last office note and labs faxed to patient's lawyer Roslynn Amble per his request at [phone] with confirmation received.

## 2020-09-26 ENCOUNTER — Other Ambulatory Visit: Payer: Self-pay | Admitting: Physician Assistant

## 2020-09-26 DIAGNOSIS — M061 Adult-onset Still's disease: Secondary | ICD-10-CM

## 2020-10-01 ENCOUNTER — Ambulatory Visit: Payer: Medicaid Other | Admitting: Physician Assistant

## 2020-10-03 NOTE — Telephone Encounter (Signed)
MRI of hip was was denied. We need to try physical therapy. Are you ok with this referral?

## 2020-10-04 NOTE — Telephone Encounter (Signed)
Referral placed.

## 2020-10-08 DIAGNOSIS — R0600 Dyspnea, unspecified: Secondary | ICD-10-CM | POA: Diagnosis not present

## 2020-10-08 DIAGNOSIS — M061 Adult-onset Still's disease: Secondary | ICD-10-CM | POA: Diagnosis not present

## 2020-10-08 DIAGNOSIS — M0820 Juvenile rheumatoid arthritis with systemic onset, unspecified site: Secondary | ICD-10-CM | POA: Diagnosis not present

## 2020-10-08 DIAGNOSIS — R5382 Chronic fatigue, unspecified: Secondary | ICD-10-CM | POA: Diagnosis not present

## 2020-10-08 DIAGNOSIS — R5381 Other malaise: Secondary | ICD-10-CM | POA: Diagnosis not present

## 2020-10-08 DIAGNOSIS — F0631 Mood disorder due to known physiological condition with depressive features: Secondary | ICD-10-CM | POA: Diagnosis not present

## 2020-10-10 ENCOUNTER — Encounter: Payer: Self-pay | Admitting: *Deleted

## 2020-10-15 ENCOUNTER — Encounter: Payer: Self-pay | Admitting: Physician Assistant

## 2020-10-17 ENCOUNTER — Telehealth: Payer: Self-pay | Admitting: Neurology

## 2020-10-17 ENCOUNTER — Other Ambulatory Visit: Payer: Self-pay | Admitting: Physician Assistant

## 2020-10-17 DIAGNOSIS — R059 Cough, unspecified: Secondary | ICD-10-CM

## 2020-10-17 DIAGNOSIS — R0602 Shortness of breath: Secondary | ICD-10-CM

## 2020-10-17 NOTE — Telephone Encounter (Signed)
Dr. Joyce Gross Able left a vm wanting to discuss patient's case. She is from [facility] Disability Determination. Wanted to talk directly with [facility]. Not sure what the process for this is, her call back number is [phone].

## 2020-10-17 NOTE — Telephone Encounter (Signed)
Called and discussed case with Production designer, theatre/television/film. Will fax my last note and rheumatology last note.

## 2020-10-18 NOTE — Telephone Encounter (Signed)
Recent office note from [facility] and Rheumatology faxed to [facility] Able to [phone] with confirmation received.

## 2020-10-24 ENCOUNTER — Encounter: Payer: Self-pay | Admitting: Physician Assistant

## 2020-10-25 ENCOUNTER — Other Ambulatory Visit: Payer: Self-pay

## 2020-10-25 ENCOUNTER — Ambulatory Visit: Payer: Medicaid Other | Attending: Physician Assistant | Admitting: Physical Therapy

## 2020-10-25 DIAGNOSIS — R262 Difficulty in walking, not elsewhere classified: Secondary | ICD-10-CM | POA: Insufficient documentation

## 2020-10-25 DIAGNOSIS — M6281 Muscle weakness (generalized): Secondary | ICD-10-CM | POA: Insufficient documentation

## 2020-10-25 DIAGNOSIS — M25651 Stiffness of right hip, not elsewhere classified: Secondary | ICD-10-CM | POA: Diagnosis not present

## 2020-10-25 DIAGNOSIS — M25551 Pain in right hip: Secondary | ICD-10-CM | POA: Insufficient documentation

## 2020-10-25 NOTE — Patient Instructions (Signed)
    Access Code: [email or web address] URL: [email or web address] Date: 10/25/2020 Prepared by: Glenetta Hew  Exercises Hooklying Hamstring Stretch with Strap - 2-3 x daily - 7 x weekly - 3 reps - 30 sec hold Supine ITB Stretch with Strap - 2-3 x daily - 7 x weekly - 3 reps - 30 sec hold Supine Quadriceps Stretch with Strap on Table - 2-3 x daily - 7 x weekly - 3 reps - 30 sec hold Hip Flexor Stretch at Edge of Bed - 2-3 x daily - 7 x weekly - 3 reps - 30 sec hold

## 2020-10-25 NOTE — Therapy (Signed)
[facility] [address]  [address] [place], [place], [address] Phone: [phone]   Fax:  [phone]  Physical Therapy Evaluation  Patient Details  Name: Raymond Owens MRN: [id number] Date of Birth: 1991/08/20 Referring Provider (PT): Jomarie Longs, [place]   Encounter Date: 10/25/2020   PT End of Session - 10/25/20 1401    Visit Number 1    Number of Visits 9    Date for PT Re-Evaluation 12/27/20    Authorization Type Medicaid [employer]    PT Start Time 1401    PT Stop Time 1454    PT Time Calculation (min) 53 min    Activity Tolerance Patient tolerated treatment well    Behavior During Therapy [facility] for tasks assessed/performed           Past Medical History:  Diagnosis Date  . Hyperlipidemia   . Myalgia 07/25/2019  . Polyarthritis 07/25/2019  . Still's disease (HCC)   . Vision loss, bilateral 07/25/2019    No past surgical history on file.  There were no vitals filed for this visit.    Subjective Assessment - 10/25/20 1405    Subjective Pt reports he has an autoimmune disease (Still's disease) which causes inflammation in all his joints and some organs. He notes crepitus and pain with movement in R hip, but disease affects both hips and knees. Insurance requiring PT to qualify for MRI.    Pertinent History Adult-onset Still's disease (AOSD); polyarthritis; thoracic and lumbar back pain; chronic pain and weakness of B ankles with recurrent R ankle sprains and associated repeated falls; DOE    Limitations Sitting;Standing;Walking;House hold activities;Lifting    How long can you sit comfortably? 20 minutes    How long can you stand comfortably? 20 walking    How long can you walk comfortably? 25 minutes    Diagnostic tests 02/10/20 - R hip x-ray: Negative.    Patient Stated Goals "not to have to think as much about how daily activities will affect his pain"    Currently in Pain? Yes    Pain Score 3    up to  6-7/10 at worst   Pain Location Hip    Pain Orientation Right    Pain Descriptors / Indicators Discomfort;Burning    Pain Type Chronic pain    Pain Radiating Towards n/a    Pain Onset More than a month ago   Nov 2020   Pain Frequency Constant   varies in itensity - typically 4-5/10   Aggravating Factors  standing/walking on hard floors, shopping, car ride >20 minutes, household chores    Pain Relieving Factors steroids, edibles    Effect of Pain on Daily Activities limited standing and walking tolerance, avoids stairs, cannot run, avoids lifting, twisting or sharp motions              [facility] PT Assessment - 10/25/20 1401      Assessment   Medical Diagnosis Chronic R hip pain    Referring Provider (PT) Jomarie Longs, PA-C    Onset Date/Surgical Date --   Nov 2020   Hand Dominance Right    Next MD Visit 03/15/21; 10/31/20 with Dr. Benjamin Stain    Prior Therapy none for current problem; remote h/o PT for LBP      Precautions   Precaution Comments lifting limited to </= 20#      Restrictions   Weight Bearing Restrictions No      Balance Screen  Has the patient fallen in the past 6 months Yes    How many times? 2 - R ankle gives way    Has the patient had a decrease in activity level because of a fear of falling?  Yes    Is the patient reluctant to leave their home because of a fear of falling?  Yes      Home Environment   Living Environment Private residence    Living Arrangements Spouse/significant other;Children    Type of Home House    Home Access Stairs to enter    Entrance Stairs-Number of Steps 1+1    Home Layout Two level;Able to live on main level with bedroom/bathroom;Laundry or work area in [employer] - single point      Prior Function   Level of Independence Independent    Vocation Unemployed   working on disability   Leisure mostly sedentary - help around the house & play video games      Cognition   Overall Cognitive Status Within  Functional Limits for tasks assessed      ROM / Strength   AROM / PROM / Strength AROM;Strength      AROM   Overall AROM Comments limited ROM B hips (R>L) - R more painful than left    AROM Assessment Site Hip    Right/Left Hip Right    Right Hip Flexion --   limited to ~90     Strength   Strength Assessment Site Hip;Knee;Ankle    Right/Left Hip Right;Left    Right Hip Flexion 4-/5    Right Hip Extension 2+/5    Right Hip External Rotation  4/5    Right Hip Internal Rotation 4-/5   pain   Right Hip ABduction 3+/5    Right Hip ADduction 2+/5    Left Hip Flexion 4-/5    Left Hip Extension 3-/5    Left Hip External Rotation 4/5    Left Hip Internal Rotation 4-/5   pain   Left Hip ABduction 4/5    Left Hip ADduction 4/5    Right/Left Knee Right;Left    Right Knee Flexion 3+/5    Right Knee Extension 4/5    Left Knee Flexion 4/5    Left Knee Extension 4/5    Right/Left Ankle Right;Left    Right Ankle Dorsiflexion 4/5    Right Ankle Plantar Flexion 3+/5    Left Ankle Dorsiflexion 4+/5    Left Ankle Plantar Flexion 3+/5      Flexibility   Soft Tissue Assessment /Muscle Length yes    Hamstrings mod tight R>L    Quadriceps mod tight quads & mod/severe tight hip flexors R>L    ITB mod/severe tight R>L    Piriformis mod tight R>L    Obturator Internus mod tight R>L                      Objective measurements completed on examination: See above findings.               PT Education - 10/25/20 1452    Education Details PT eval findings, anticipated POC & initial HEP - Access Code: [email or web address]    Person(s) Educated Patient    Methods Explanation;Demonstration;Verbal cues;Tactile cues;Handout    Comprehension Verbalized understanding;Verbal cues required;Tactile cues required;Returned demonstration;Need further instruction            PT Short Term Goals - 10/25/20 1454  PT SHORT TERM GOAL #1   Title Patient will be independent with initial HEP     Status New    Target Date 11/22/20             PT Long Term Goals - 10/25/20 1454      PT LONG TERM GOAL #1   Title Patient will be independent with ongoing/advanced HEP for self-management at home    Status New    Target Date 12/27/20      PT LONG TERM GOAL #2   Title Patient to improve R hip AROM to [facility] without pain reduced by >/= 50%    Status New    Target Date 12/27/20      PT LONG TERM GOAL #3   Title Patient will demonstrate improved B LE strength to >/= 4+/5 for improved stability and ease of mobility    Status New    Target Date 12/27/20      PT LONG TERM GOAL #4   Title Patient will improve standing and/or walking tolerance to >/= 45 minutes w/o pain interference to increase toleranc for normal daily activities such as grocery shopping    Status New    Target Date 12/27/20                  Plan - 10/25/20 1451    Clinical Impression Statement [name] is a 30 y/o male who presents to OP PT for chronic R hip pain originating in Nov 2020. Pain attributed to inflammation resulting from adult-onset Still's disease (AOSD) which is affecting his B hips and knees currently however R hip is considerably worse with crepitus noted in addition to pain with motion. Deficits include R>L hip and knee pain; decreased R>L hip ROM with moderately to severely limited proximal flexibility; chronic R ankle instability with repeated ankle sprains and associated falls; decreased strength; antalgic gait and limited activity tolerance. Pain limits positional tolerance for sitting, standing and walking which limits his ability to complete all normal daily tasks. [name] will benefit from skilled PT to address above deficits to reduce or eliminate R hip pain, restore functional R hip ROM and LE strength and improve transitional mobility and walking/activity tolerance.    Personal Factors and Comorbidities Time since onset of injury/illness/exacerbation;Past/Current Experience;Comorbidity 3+     Comorbidities Adult-onset Still's disease (AOSD); polyarthritis; thoracic and lumbar back pain; chronic pain and weakness of B ankles with recurrent R ankle sprains and associated repeated falls; DOE    Examination-Activity Limitations Bed Mobility;Bend;Caring for Others;Carry;Lift;Locomotion Level;Sit;Sleep;Squat;Stairs;Stand;Transfers    Examination-Participation Restrictions Cleaning;Community Activity;Driving;Interpersonal Relationship;Laundry;Meal Prep;Occupation;Shop;Yard Work    Conservation officer, historic buildings Evolving/Moderate complexity    Clinical Decision Making Moderate    Rehab Potential Good    PT Frequency 1x / week   2x/wk recommended but pt requesting only 1x/wk due to his children's therapty schedules   PT Duration 8 weeks    PT Treatment/Interventions ADLs/Self Care Home Management;Cryotherapy;Electrical Stimulation;Iontophoresis 4mg /ml Dexamethasone;Moist Heat;Ultrasound;DME Instruction;Gait training;Stair training;Functional mobility training;Therapeutic activities;Therapeutic exercise;Balance training;Neuromuscular re-education;Patient/family education;Manual techniques;Passive range of motion;Dry needling;Energy conservation;Taping;Joint Manipulations    PT Next Visit Plan review initial HEP and expand of stretches to address remaining hip tightness; gentle ROM and strengtheing; balance and proprioceptive training - regular HEP updates d/t 1x/wk frequency    PT Home Exercise Plan MedBridge Access Code: ([email or web address])    Consulted and Agree with Plan of Care Patient           Patient will benefit from skilled therapeutic intervention in order  to improve the following deficits and impairments:  Abnormal gait,Decreased activity tolerance,Decreased balance,Decreased coordination,Decreased endurance,Decreased knowledge of precautions,Decreased knowledge of use of DME,Decreased mobility,Decreased range of motion,Decreased safety awareness,Decreased strength,Difficulty  walking,Increased fascial restricitons,Increased muscle spasms,Impaired perceived functional ability,Impaired flexibility,Improper body mechanics,Postural dysfunction,Pain  Visit Diagnosis: Pain in right hip  Stiffness of right hip, not elsewhere classified  Muscle weakness (generalized)  Difficulty in walking, not elsewhere classified     Problem List Patient Active Problem List   Diagnosis Date Noted  . Chronic right hip pain 09/18/2020  . Shortness of breath on exertion 09/18/2020  . Chronic pain syndrome 09/14/2020  . Chronic dyspnea 06/01/2020  . Pain of left calf 06/01/2020  . Ankle weakness 04/02/2020  . Chronic pain of both ankles 04/02/2020  . Acute midline thoracic back pain 10/14/2019  . Double vision 08/05/2019  . Weakness 08/05/2019  . Adjustment disorder with depressed mood 08/05/2019  . Acute midline low back pain without sciatica 08/05/2019  . 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 Vision loss, bilateral 07/25/2019  . Myalgia 07/25/2019  . Polyarthritis 07/25/2019  . Multiple joint pain 07/20/2019  . Pneumonia of both lungs due to infectious organism 07/20/2019  . Abnormal liver function   . Aseptic meningitis   . Leukocytosis   . Acute respiratory failure with hypoxemia (HCC) 07/08/2019  . FUO (fever of unknown origin) 07/07/2019  . Headache 07/07/2019  . Rash 07/07/2019  . Sepsis (HCC) 07/06/2019  . Chronic heel pain, left 05/20/2018  . Hypertriglyceridemia 06/30/2016  . Ringworm 06/29/2016    Marry GuanJoAnne M Karren Newland, PT, MPT 10/25/2020, 6:16 PM  [facility] [place] [address]  [address] [place], [place], [address] Phone: [phone]   Fax:  [phone]  Name: Leota JacobsenSteven Lyn MRN: [id number] Date of Birth: 1990/10/19

## 2020-10-30 ENCOUNTER — Ambulatory Visit: Payer: Medicaid Other | Admitting: Sports Medicine

## 2020-10-31 ENCOUNTER — Ambulatory Visit (INDEPENDENT_AMBULATORY_CARE_PROVIDER_SITE_OTHER): Payer: Medicaid Other

## 2020-10-31 ENCOUNTER — Ambulatory Visit: Payer: Medicaid Other | Admitting: Sports Medicine

## 2020-10-31 ENCOUNTER — Other Ambulatory Visit: Payer: Self-pay

## 2020-10-31 DIAGNOSIS — R059 Cough, unspecified: Secondary | ICD-10-CM | POA: Diagnosis not present

## 2020-10-31 DIAGNOSIS — M25551 Pain in right hip: Secondary | ICD-10-CM

## 2020-10-31 DIAGNOSIS — R0602 Shortness of breath: Secondary | ICD-10-CM | POA: Diagnosis not present

## 2020-10-31 DIAGNOSIS — G8929 Other chronic pain: Secondary | ICD-10-CM | POA: Diagnosis not present

## 2020-10-31 IMAGING — DX DG CHEST 2V
2 series · 2 of 2 positions shown · non-contrast
Comparison: [DATE]; chest CT [DATE]

CLINICAL DATA: Cough and shortness of breath

EXAM:
CHEST - 2 VIEW

[chest pa]
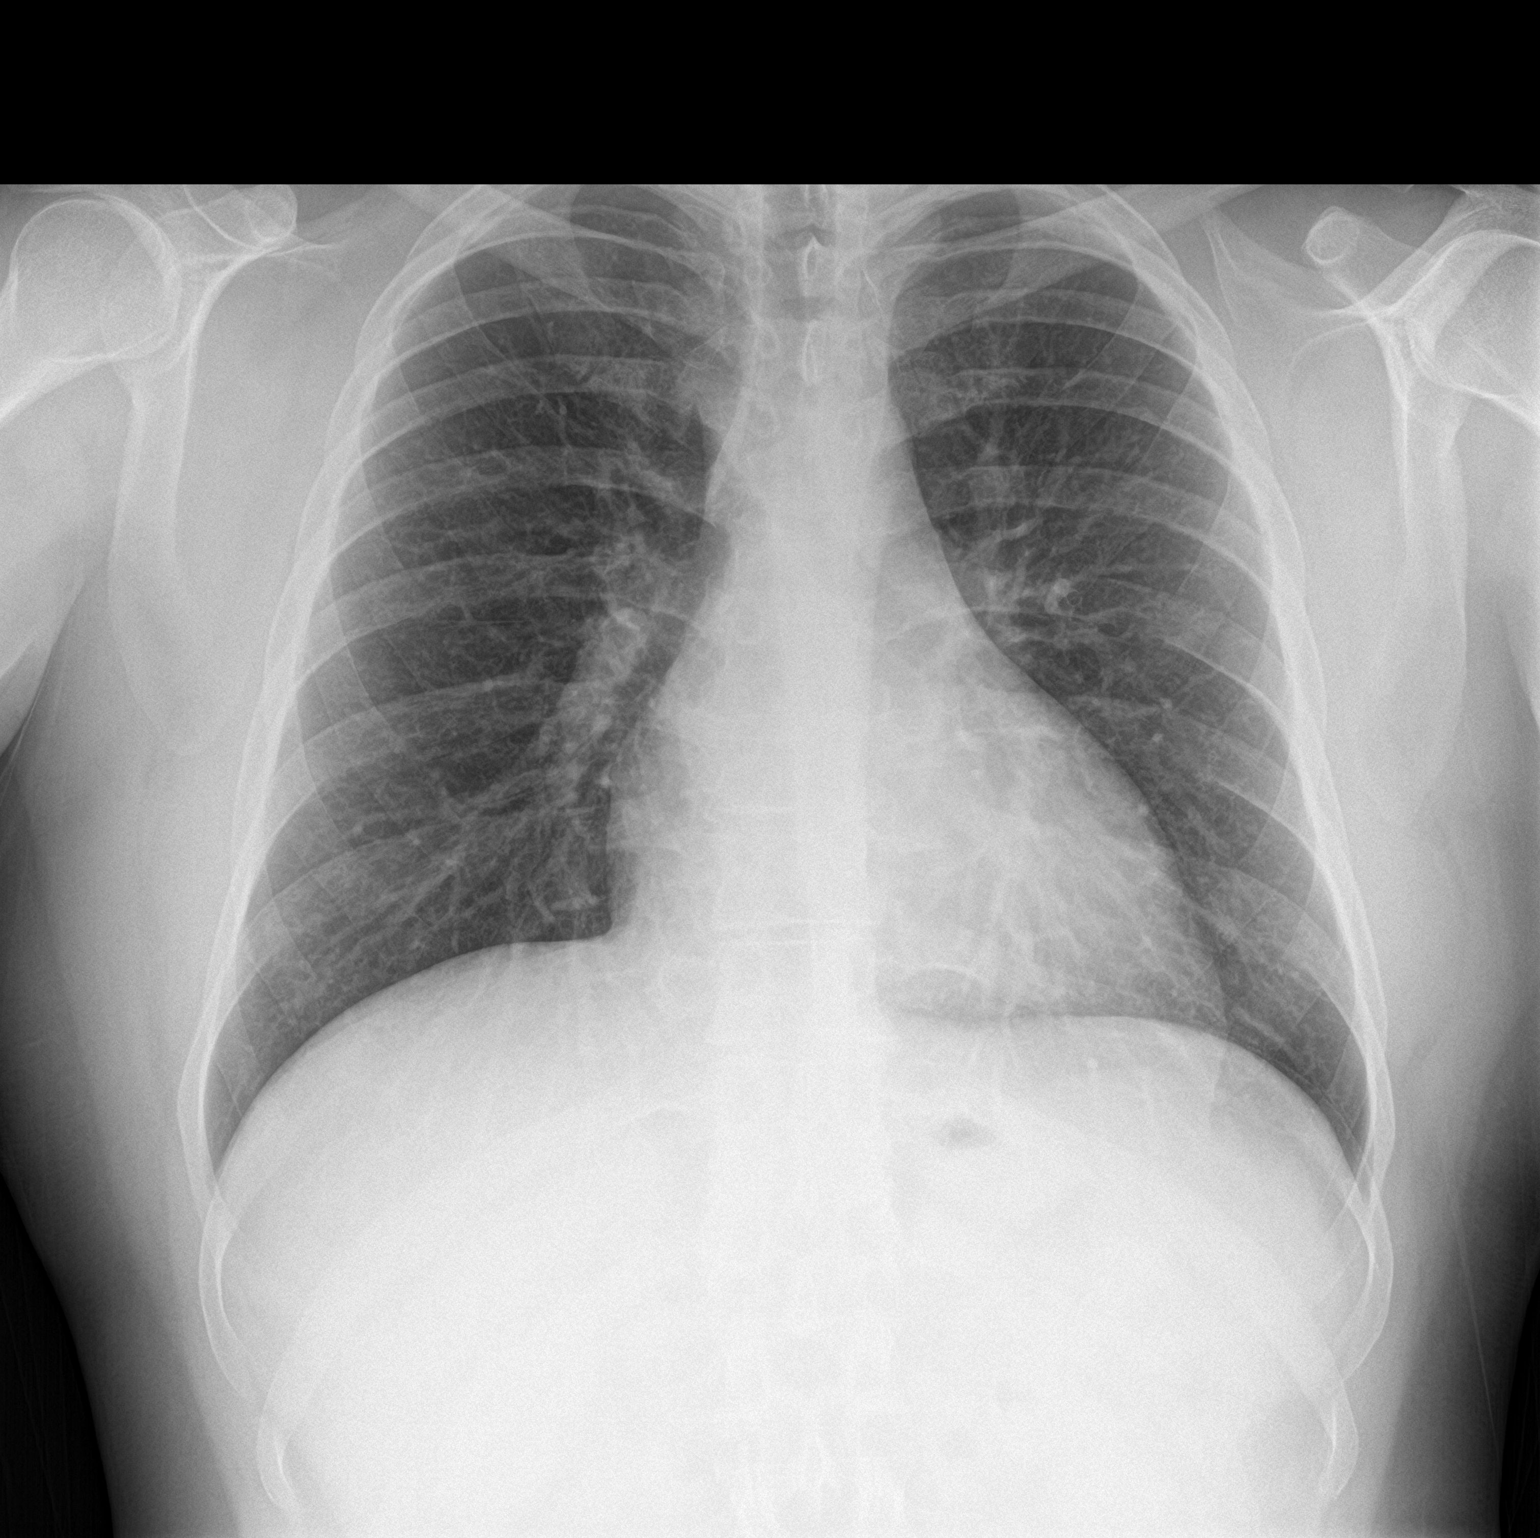

[chest lat]
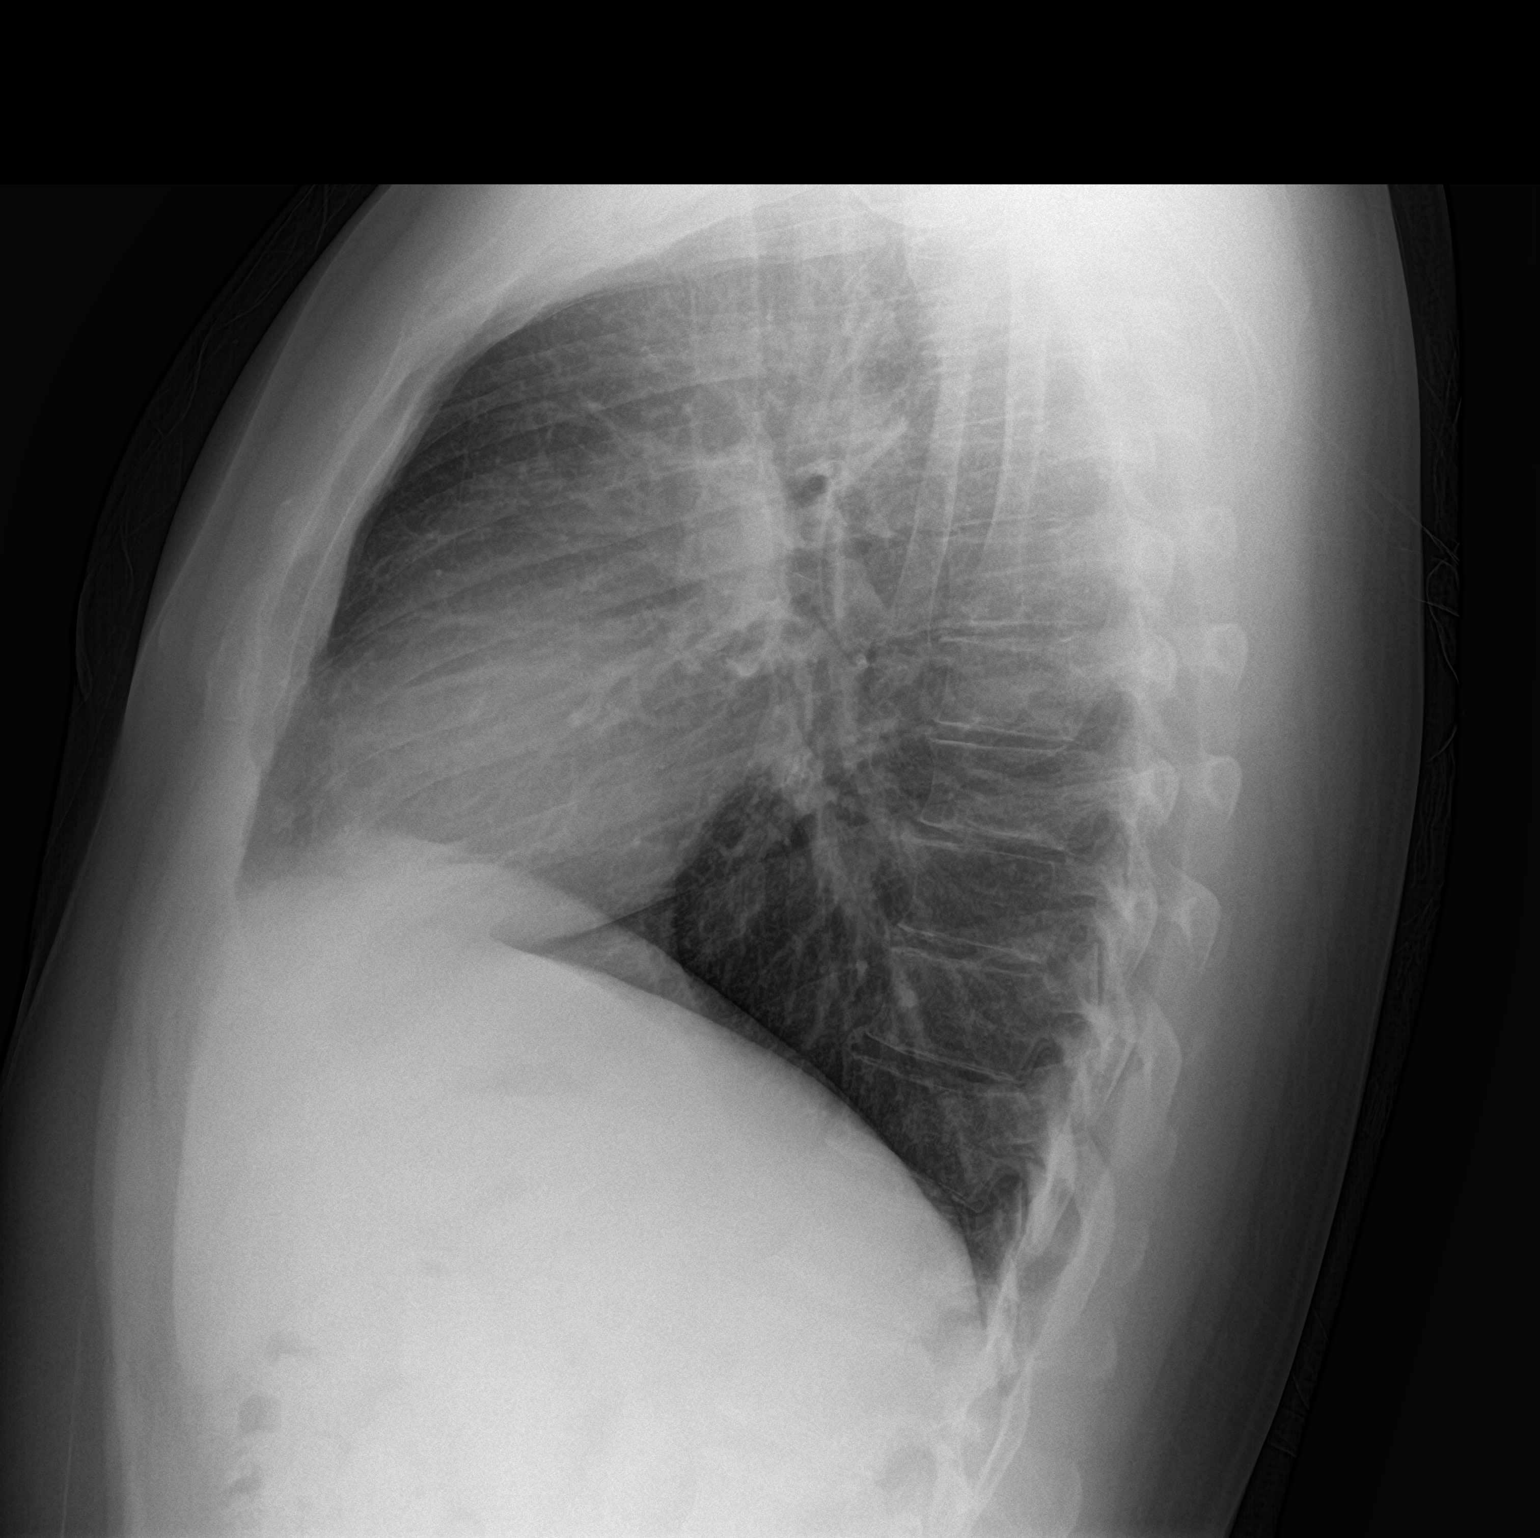

[2 of 2 positions shown; findings below may reference images not displayed]

FINDINGS: Lungs are clear. Heart size and pulmonary vascularity are normal. No
adenopathy. No bone lesions.
IMPRESSION: Lungs clear.  Cardiac silhouette normal.

## 2020-10-31 NOTE — Progress Notes (Signed)
    Procedures performed today:    None.  Independent interpretation of notes and tests performed by another provider:   Hip x-rays personally reviewed, no obvious evidence of avascular necrosis or osteoarthritis.  Brief History, Exam, Impression, and Recommendations:    Chronic right hip pain This is a very pleasant 30 year old male, he does have stills disease, managed with [facility] rheumatology, he has been on prednisone for extended period of time, he does not drink. Currently there are talks of trying Biologics. Unfortunately his hip pain has been severe, chronic for several months now, he has done greater than 6 weeks of physician directed rehabilitation, physical therapy, oral medications including NSAIDs, tramadol. X-rays were unrevealing. My concern is for a vascular process and/or hip labral tear. We can certainly try an injection but I would like to do this as a hip MR arthrogram or we can do a steroid injection and gadolinium injection at the same time. I am going to go ahead and order his hip MR arthrogram and hopefully we can do this procedure on Monday.    ___________________________________________ Ihor Austin. Benjamin Stain, M.D., ABFM., CAQSM. Primary Care and Sports Medicine [facility]  Adjunct Instructor of Family Medicine  [employer]

## 2020-10-31 NOTE — Assessment & Plan Note (Signed)
This is a very pleasant 30 year old male, he does have stills disease, managed with [facility], he has been on prednisone for extended period of time, he does not drink. Currently there are talks of trying Biologics. Unfortunately his hip pain has been severe, chronic for several months now, he has done greater than 6 weeks of physician directed rehabilitation, physical therapy, oral medications including NSAIDs, tramadol. X-rays were unrevealing. My concern is for a vascular process and/or hip labral tear. We can certainly try an injection but I would like to do this as a hip MR arthrogram or we can do a steroid injection and gadolinium injection at the same time. I am going to go ahead and order his hip MR arthrogram and hopefully we can do this procedure on Monday.

## 2020-11-01 NOTE — Progress Notes (Signed)
CXR looks normal.

## 2020-11-03 ENCOUNTER — Encounter: Payer: Self-pay | Admitting: Physician Assistant

## 2020-11-03 DIAGNOSIS — Z79899 Other long term (current) drug therapy: Secondary | ICD-10-CM

## 2020-11-05 NOTE — Telephone Encounter (Signed)
Please order labs and fax downstairs.

## 2020-11-05 NOTE — Telephone Encounter (Signed)
Labs ordered.

## 2020-11-07 ENCOUNTER — Ambulatory Visit: Payer: Medicaid Other

## 2020-11-08 DIAGNOSIS — Z79899 Other long term (current) drug therapy: Secondary | ICD-10-CM | POA: Diagnosis not present

## 2020-11-09 NOTE — Telephone Encounter (Signed)
Please fax labs to [facility] rheumatology.   Inflammation rate way down.  Overall really good labs.

## 2020-11-10 LAB — COMPLETE METABOLIC PANEL WITH GFR
AG Ratio: 1.7 (calc) (ref 1.0–2.5)
ALT: 47 U/L — ABNORMAL HIGH (ref 9–46)
AST: 27 U/L (ref 10–40)
Albumin: 4.3 g/dL (ref 3.6–5.1)
Alkaline phosphatase (APISO): 59 U/L (ref 36–130)
BUN: 16 mg/dL (ref 7–25)
CO2: 29 mmol/L (ref 20–32)
Calcium: 9.4 mg/dL (ref 8.6–10.3)
Chloride: 105 mmol/L (ref 98–110)
Creat: 0.85 mg/dL (ref 0.60–1.35)
GFR, Est African American: 137 mL/min/{1.73_m2} (ref >=60)
GFR, Est Non African American: 119 mL/min/{1.73_m2} (ref >=60)
Globulin: 2.5 g/dL (calc) (ref 1.9–3.7)
Glucose, Bld: 84 mg/dL (ref 65–99)
Potassium: 4.3 mmol/L (ref 3.5–5.3)
Sodium: 141 mmol/L (ref 135–146)
Total Bilirubin: 0.6 mg/dL (ref 0.2–1.2)
Total Protein: 6.8 g/dL (ref 6.1–8.1)

## 2020-11-10 LAB — CBC WITH DIFFERENTIAL/PLATELET
Absolute Monocytes: 800 cells/uL (ref 200–950)
Basophils Absolute: 28 cells/uL (ref 0–200)
Basophils Relative: 0.3 %
Eosinophils Absolute: 149 cells/uL (ref 15–500)
Eosinophils Relative: 1.6 %
HCT: 44 % (ref 38.5–50.0)
Hemoglobin: 15 g/dL (ref 13.2–17.1)
Lymphs Abs: 2864 cells/uL (ref 850–3900)
MCH: 31.2 pg (ref 27.0–33.0)
MCHC: 34.1 g/dL (ref 32.0–36.0)
MCV: 91.5 fL (ref 80.0–100.0)
MPV: 10.5 fL (ref 7.5–12.5)
Monocytes Relative: 8.6 %
Neutro Abs: 5459 cells/uL (ref 1500–7800)
Neutrophils Relative %: 58.7 %
Platelets: 294 10*3/uL (ref 140–400)
RBC: 4.81 10*6/uL (ref 4.20–5.80)
RDW: 13 % (ref 11.0–15.0)
Total Lymphocyte: 30.8 %
WBC: 9.3 10*3/uL (ref 3.8–10.8)

## 2020-11-10 LAB — QUANTIFERON-TB GOLD PLUS
Mitogen-NIL: >10 IU/mL
NIL: 0.01 IU/mL
QuantiFERON-TB Gold Plus: NEGATIVE
TB1-NIL: 0.01 IU/mL
TB2-NIL: 0.01 IU/mL

## 2020-11-10 LAB — SEDIMENTATION RATE: Sed Rate: 2 mm/h (ref 0–15)

## 2020-11-10 LAB — C-REACTIVE PROTEIN: CRP: 2.9 mg/L (ref <8.0)

## 2020-11-12 NOTE — Telephone Encounter (Signed)
Negative TB screen.

## 2020-11-14 ENCOUNTER — Ambulatory Visit: Payer: Medicaid Other | Admitting: Physical Therapy

## 2020-11-14 ENCOUNTER — Encounter: Payer: Self-pay | Admitting: Physical Therapy

## 2020-11-14 ENCOUNTER — Other Ambulatory Visit: Payer: Self-pay

## 2020-11-14 DIAGNOSIS — R262 Difficulty in walking, not elsewhere classified: Secondary | ICD-10-CM

## 2020-11-14 DIAGNOSIS — M25551 Pain in right hip: Secondary | ICD-10-CM

## 2020-11-14 DIAGNOSIS — M25651 Stiffness of right hip, not elsewhere classified: Secondary | ICD-10-CM | POA: Diagnosis not present

## 2020-11-14 DIAGNOSIS — M6281 Muscle weakness (generalized): Secondary | ICD-10-CM | POA: Diagnosis not present

## 2020-11-14 NOTE — Patient Instructions (Signed)
    Access Code: [email or web address] URL: [email or web address] Date: 11/14/2020 Prepared by: Glenetta Hew  Exercises Seated Hamstring Stretch - 2-3 x daily - 7 x weekly - 3 reps - 30 sec hold Standing ITB Stretch - 2-3 x daily - 7 x weekly - 3 reps - 30 sec hold Seated Hip Flexor Stretch - 2-3 x daily - 7 x weekly - 3 reps - 30 sec hold Seated Hip Adduction Squeeze with Ball - 1 x daily - 7 x weekly - 2 sets - 10 reps - 5 sec hold Seated Isometric Hip Abduction with Resistance - 1 x daily - 7 x weekly - 2 sets - 10 reps - 3 sec hold Hip Extension with Resistance Loop - 1 x daily - 7 x weekly - 2 sets - 10 reps - 2-3 sec hold

## 2020-11-14 NOTE — Therapy (Signed)
[facility] [address]  [address] [place], [place], [address] Phone: [phone]   Fax:  [phone]  Physical Therapy Treatment  Patient Details  Name: Raymond Owens MRN: [id number] Date of Birth: [birth year] Referring Provider (PT): Jomarie Longs, [place]   Encounter Date: 11/14/2020   PT End of Session - 11/14/20 1028    Visit Number 2    Number of Visits 9    Date for PT Re-Evaluation 12/27/20    Authorization Type Medicaid [employer]    PT Start Time 1028   Pt arrived late than was in BR for several minutes   PT Stop Time 1101    PT Time Calculation (min) 33 min    Activity Tolerance Patient tolerated treatment well    Behavior During Therapy [facility] for tasks assessed/performed           Past Medical History:  Diagnosis Date  . Hyperlipidemia   . Myalgia 07/25/2019  . Polyarthritis 07/25/2019  . Still's disease (HCC)   . Vision loss, bilateral 07/25/2019    History reviewed. No pertinent surgical history.  There were no vitals filed for this visit.   Subjective Assessment - 11/14/20 1031    Subjective Pt reports a crushing sensation type pain on Monday following picking up his child that calmed by Tuesday. He has been able to do stretches and feels they help. Reports some days are better than others and he feels his steroids are needed to feel and function better. He reports he will be starting a new medication this week and will bring info on med to next visit.    Pertinent History Adult-onset Still's disease (AOSD); polyarthritis; thoracic and lumbar back pain; chronic pain and weakness of B ankles with recurrent R ankle sprains and associated repeated falls; DOE    Limitations Sitting;Standing;Walking;House hold activities;Lifting    How long can you sit comfortably? 20 minutes    How long can you stand comfortably? 20 walking    How long can you walk comfortably? 25 minutes    Diagnostic tests 02/10/20 - R hip  x-ray: Negative.    Patient Stated Goals "not to have to think as much about how daily activities will affect his pain"    Currently in Pain? Yes    Pain Score 4     Pain Location Hip    Pain Orientation Right    Pain Onset More than a month ago   Nov 2020   Pain Relieving Factors steroids                             [facility] Adult PT Treatment/Exercise - 11/14/20 0001      Knee/Hip Exercises: Stretches   Active Hamstring Stretch --    Passive Hamstring Stretch Right;2 reps;30 seconds    Passive Hamstring Stretch Limitations supine with strap - limited range; 2nd set was a seated hip hinge as alternative - pt preferrring the latter    Quad Stretch --    Hip Flexor Stretch Right;3 reps;30 seconds    Hip Flexor Stretch Limitations mod thomas with strap, mod thomas with opp knee to chest & seated lunge position - pt preferring the latter    ITB Stretch Right;3 reps;30 seconds    ITB Stretch Limitations Prefers standing latreral side-bend stretch > lateral lean into wall or supine stretch      Knee/Hip Exercises: Standing   Hip Extension Right;Left;1  set;10 reps;AROM;Stengthening;Knee straight   alternating LE to avoid prolonged compressive force through hip in SLS   Extension Limitations yellow TB at the knees instead of the ankles      Knee/Hip Exercises: Seated   Ball Squeeze 10 x 5 sec    Abduction/Adduction  Right;Left;2 sets;10 reps;AROM;Strengthening    Abd/Adduction Limitations yellow TB alt LE AROM & isometric                  PT Education - 11/14/20 1101    Education Details Educated on progression of HEP.    Person(s) Educated Patient    Methods Explanation;Demonstration;Tactile cues;Verbal cues;Handout    Comprehension Verbalized understanding;Returned demonstration;Verbal cues required;Tactile cues required            PT Short Term Goals - 11/14/20 1035      PT SHORT TERM GOAL #1   Title Patient will be independent with initial HEP     Status On-going   11/14/20 - initial HEP revised   Target Date 11/22/20             PT Long Term Goals - 11/14/20 1036      PT LONG TERM GOAL #1   Title Patient will be independent with ongoing/advanced HEP for self-management at home    Status On-going    Target Date 12/27/20      PT LONG TERM GOAL #2   Title Patient to improve R hip AROM to [facility] without pain reduced by >/= 50%    Status On-going    Target Date 12/27/20      PT LONG TERM GOAL #3   Title Patient will demonstrate improved B LE strength to >/= 4+/5 for improved stability and ease of mobility    Status On-going    Target Date 12/27/20      PT LONG TERM GOAL #4   Title Patient will improve standing and/or walking tolerance to >/= 45 minutes w/o pain interference to increase toleranc for normal daily activities such as grocery shopping    Status On-going    Target Date 12/27/20                 Plan - 11/14/20 1102    Clinical Impression Statement Raymond Owens reports limited performance of HEP relative to prescribed frequency but does try to get to exercises as much as possible. Review of the initial HEP revealed available ROM with stretches less than observed at eval - pt attributes this to lower steroid dose than he had previously been at the time of the eval. Provided instruction in alternative versions of the stretches with pt noting better tolerance and preference for seated and standing versions or HS, hip flexor and ITB stretches. Introduced light B hip strengthening, predominantly in sitting due to limited tolerance for single limb stance due to feeling of increased compressive forces through his joints. He also notes this feeling when he lifts and holds his ~25# toddler at home. [name] will continue to benefit from skilled PT for improved hip flexibility and strength to improve muscular support for hips for improved weight bearing and activity tolerance.    Personal Factors and Comorbidities Time since onset of  injury/illness/exacerbation;Past/Current Experience;Comorbidity 3+    Comorbidities Adult-onset Still's disease (AOSD); polyarthritis; thoracic and lumbar back pain; chronic pain and weakness of B ankles with recurrent R ankle sprains and associated repeated falls; DOE    Examination-Activity Limitations Bed Mobility;Bend;Caring for Others;Carry;Lift;Locomotion Level;Sit;Sleep;Squat;Stairs;Stand;Transfers    Examination-Participation Restrictions Cleaning;Community Activity;Driving;Interpersonal Relationship;Laundry;Meal Prep;Occupation;Shop;Yard  Work    Conservation officer, historic buildings Evolving/Moderate complexity    Rehab Potential Good    PT Frequency 1x / week   2x/wk recommended but pt requesting only 1x/wk due to his children's therapty schedules   PT Duration 8 weeks    PT Treatment/Interventions ADLs/Self Care Home Management;Cryotherapy;Electrical Stimulation;Iontophoresis 4mg /ml Dexamethasone;Moist Heat;Ultrasound;DME Instruction;Gait training;Stair training;Functional mobility training;Therapeutic activities;Therapeutic exercise;Balance training;Neuromuscular re-education;Patient/family education;Manual techniques;Passive range of motion;Dry needling;Energy conservation;Taping;Joint Manipulations    PT Next Visit Plan review revised HEP and expand stretches to address remaining hip tightness; progress gentle ROM and strengthening; balance and proprioceptive training - regular HEP updates d/t 1x/wk frequency    PT Home Exercise Plan MedBridge Access Code: (3/3, revised 3/23)    Consulted and Agree with Plan of Care Patient           Patient will benefit from skilled therapeutic intervention in order to improve the following deficits and impairments:  Abnormal gait,Decreased activity tolerance,Decreased balance,Decreased coordination,Decreased endurance,Decreased knowledge of precautions,Decreased knowledge of use of DME,Decreased mobility,Decreased range of motion,Decreased  safety awareness,Decreased strength,Difficulty walking,Increased fascial restricitons,Increased muscle spasms,Impaired perceived functional ability,Impaired flexibility,Improper body mechanics,Postural dysfunction,Pain  Visit Diagnosis: Pain in right hip  Stiffness of right hip, not elsewhere classified  Muscle weakness (generalized)  Difficulty in walking, not elsewhere classified     Problem List Patient Active Problem List   Diagnosis Date Noted  . Chronic right hip pain 09/18/2020  . Shortness of breath on exertion 09/18/2020  . Chronic pain syndrome 09/14/2020  . Chronic dyspnea 06/01/2020  . Pain of left calf 06/01/2020  . Ankle weakness 04/02/2020  . Chronic pain of both ankles 04/02/2020  . Acute midline thoracic back pain 10/14/2019  . Double vision 08/05/2019  . Weakness 08/05/2019  . Adjustment disorder with depressed mood 08/05/2019  . Acute midline low back pain without sciatica 08/05/2019  . 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 Vision loss, bilateral 07/25/2019  . Myalgia 07/25/2019  . Polyarthritis 07/25/2019  . Multiple joint pain 07/20/2019  . Pneumonia of both lungs due to infectious organism 07/20/2019  . Abnormal liver function   . Aseptic meningitis   . Leukocytosis   . Acute respiratory failure with hypoxemia (HCC) 07/08/2019  . FUO (fever of unknown origin) 07/07/2019  . Headache 07/07/2019  . Rash 07/07/2019  . Sepsis (HCC) 07/06/2019  . Chronic heel pain, left 05/20/2018  . Hypertriglyceridemia 06/30/2016  . Ringworm 06/29/2016    13/12/2015, PT, MPT 11/14/2020, 12:38 PM  [facility] [address]  [address] [place], [place], [place] Phone: [phone]   Fax:  [phone]  Name: Raymond Owens MRN: [name] Date of Birth: Sep 18, 1990

## 2020-11-14 NOTE — Telephone Encounter (Signed)
Raymond Owens, letter written.

## 2020-11-20 ENCOUNTER — Other Ambulatory Visit: Payer: Self-pay

## 2020-11-20 ENCOUNTER — Other Ambulatory Visit: Payer: Self-pay | Admitting: Physician Assistant

## 2020-11-20 ENCOUNTER — Ambulatory Visit: Payer: Medicaid Other | Admitting: Physical Therapy

## 2020-11-20 ENCOUNTER — Encounter: Payer: Self-pay | Admitting: Physical Therapy

## 2020-11-20 DIAGNOSIS — M25651 Stiffness of right hip, not elsewhere classified: Secondary | ICD-10-CM

## 2020-11-20 DIAGNOSIS — M6281 Muscle weakness (generalized): Secondary | ICD-10-CM | POA: Diagnosis not present

## 2020-11-20 DIAGNOSIS — R262 Difficulty in walking, not elsewhere classified: Secondary | ICD-10-CM

## 2020-11-20 DIAGNOSIS — M25551 Pain in right hip: Secondary | ICD-10-CM

## 2020-11-20 DIAGNOSIS — M061 Adult-onset Still's disease: Secondary | ICD-10-CM

## 2020-11-20 MED ORDER — METHOTREXATE 2.5 MG PO TABS: 20.0000 mg | ORAL_TABLET | ORAL | 0 refills | Status: DC

## 2020-11-20 NOTE — Patient Instructions (Signed)
Access Code: [email or web address] URL: [email or web address] Date: 11/20/2020 Prepared by: Glenetta Hew  Exercises Standing ITB Stretch - 2-3 x daily - 7 x weekly - 3 reps - 30 sec hold Seated Hamstring Stretch - 2-3 x daily - 7 x weekly - 3 reps - 30 sec hold Seated Hip Flexor Stretch - 2-3 x daily - 7 x weekly - 3 reps - 30 sec hold Seated Hip Adduction Squeeze with Ball - 1 x daily - 7 x weekly - 2 sets - 10 reps - 5 sec hold Seated Isometric Hip Abduction with Resistance - 1 x daily - 7 x weekly - 2 sets - 10 reps - 3 sec hold Beginner Bridge - 1 x daily - 7 x weekly - 2 sets - 5 reps - 3 sec hold Supine March with Resistance Band - 1 x daily - 7 x weekly - 2 sets - 5 reps - 2-3 sec hold Hip Extension with Resistance Loop - 1 x daily - 7 x weekly - 2 sets - 10 reps - 2-3 sec hold

## 2020-11-20 NOTE — Therapy (Signed)
[facility] Out[facility] Rehabilitation [facility] [address]  [address] [place], [place], [address] Phone: [phone]   Fax:  [phone]  Physical Therapy Treatment  Patient Details  Name: Raymond Owens MRN: [id number] Date of Birth: September 05, 1990 Referring Provider (PT): Jomarie Longs, [place]   Encounter Date: 11/20/2020   PT End of Session - 11/20/20 0933    Visit Number 3    Number of Visits 9    Date for PT Re-Evaluation 12/27/20    Authorization Type Medicaid [employer]    PT Start Time [phone]    PT Stop Time 1016    PT Time Calculation (min) 43 min    Activity Tolerance Patient tolerated treatment well;Patient limited by pain    Behavior During Therapy [facility] for tasks assessed/performed           Past Medical History:  Diagnosis Date  . Hyperlipidemia   . Myalgia 07/25/2019  . Polyarthritis 07/25/2019  . Still's disease (HCC)   . Vision loss, bilateral 07/25/2019    History reviewed. No pertinent surgical history.  There were no vitals filed for this visit.   Subjective Assessment - 11/20/20 0936    Subjective Pt reports he is on a new drug - 1x/month injection of Ilaris - and has tapered his steriods down to 8 mg. Pain better currently but did note increased joint pain for 2 days following last session. Pt noting better tolerance for modified stretches.    Pertinent History Adult-onset Still's disease (AOSD); polyarthritis; thoracic and lumbar back pain; chronic pain and weakness of B ankles with recurrent R ankle sprains and associated repeated falls; DOE    Limitations --    How long can you sit comfortably? --    How long can you stand comfortably? --    How long can you walk comfortably? --    Diagnostic tests 02/10/20 - R hip x-ray: Negative.    Patient Stated Goals "not to have to think as much about how daily activities will affect his pain"    Currently in Pain? No/denies    Pain Onset More than a month ago   Nov 2020                             [facility] Adult PT Treatment/Exercise - 11/20/20 0933      Exercises   Exercises Knee/Hip      Knee/Hip Exercises: Stretches   Passive Hamstring Stretch Right;1 rep;30 seconds    Passive Hamstring Stretch Limitations seated hip hinge - increased joint pain noted today    Hip Flexor Stretch Right;2 reps;30 seconds    Hip Flexor Stretch Limitations seated lunge position    ITB Stretch Right;Left;1 rep;30 seconds    ITB Stretch Limitations standing latreral side-bend      Knee/Hip Exercises: Aerobic   Recumbent Bike L1 x 4 min   limited by onset of pain     Knee/Hip Exercises: Seated   Ball Squeeze 10 x 5 sec    Abduction/Adduction  Right;Left;10 reps;2 sets;AROM;Strengthening    Abd/Adduction Limitations yellow TB alt LE AROM & isometric      Knee/Hip Exercises: Supine   Bridges Both;Strengthening;5 reps   3-5 sec hold   Bridges Limitations + yellow TB hip ABD isometric   deferred d/t c/o increasing pain   Straight Leg Raises Right;Left;5 reps    Other Supine Knee/Hip Exercises Yellow TB brace marching 10 x 2"  Other Supine Knee/Hip Exercises Yellow TB alternating hip ABD/ER 10 x 2"      Knee/Hip Exercises: Sidelying   Hip ABduction Right;Left;5 reps;Strengthening    Clams R/L clam 5 x 2"                  PT Education - 11/20/20 1014    Education Details HEP update - pt instructed to reduce sets to 5 reps as needed to allow for better tolerance as well as performing strengthening exercises on alternating days if needed to allow for recovery day    Person(s) Educated Patient    Methods Explanation;Demonstration;Verbal cues;Handout    Comprehension Verbalized understanding;Verbal cues required;Returned demonstration;Need further instruction            PT Short Term Goals - 11/14/20 1035      PT SHORT TERM GOAL #1   Title Patient will be independent with initial HEP    Status On-going   11/14/20 - initial HEP revised   Target  Date 11/22/20             PT Long Term Goals - 11/14/20 1036      PT LONG TERM GOAL #1   Title Patient will be independent with ongoing/advanced HEP for self-management at home    Status On-going    Target Date 12/27/20      PT LONG TERM GOAL #2   Title Patient to improve R hip AROM to [facility] without pain reduced by >/= 50%    Status On-going    Target Date 12/27/20      PT LONG TERM GOAL #3   Title Patient will demonstrate improved B LE strength to >/= 4+/5 for improved stability and ease of mobility    Status On-going    Target Date 12/27/20      PT LONG TERM GOAL #4   Title Patient will improve standing and/or walking tolerance to >/= 45 minutes w/o pain interference to increase toleranc for normal daily activities such as grocery shopping    Status On-going    Target Date 12/27/20                 Plan - 11/20/20 1021    Clinical Impression Statement Raymond Owens reports increased joint pain but no muscular pain for ~2 days following last PT session but unable to identify triggering exercises/activities. Recently started on a new injectable med, Ilaris, which should reach its peak effectiveness tomorrow with pt noting less pain today. Pt reports modified stretches are better than the initial versions but still noting some increased pain upon review today. Pt demonstrating limited tolerance for all activities and exercises due to pain and/or fatigue therefore reduced reps with several exercises and focused mostly on open chain exercises to reduce joint compressive forces. HEP updated according to patient preference for most tolerable exercises.    Comorbidities Adult-onset Still's disease (AOSD); polyarthritis; thoracic and lumbar back pain; chronic pain and weakness of B ankles with recurrent R ankle sprains and associated repeated falls; DOE    Rehab Potential Good    PT Frequency 1x / week   2x/wk recommended but pt requesting only 1x/wk due to his children's therapty schedules    PT Duration 8 weeks    PT Treatment/Interventions ADLs/Self Care Home Management;Cryotherapy;Electrical Stimulation;Iontophoresis 4mg /ml Dexamethasone;Moist Heat;Ultrasound;DME Instruction;Gait training;Stair training;Functional mobility training;Therapeutic activities;Therapeutic exercise;Balance training;Neuromuscular re-education;Manual techniques;Passive range of motion;Dry needling;Energy conservation;Taping;Joint Manipulations    PT Next Visit Plan progress gentle ROM and strengthening; balance and proprioceptive training -  regular HEP updates d/t 1x/wk frequency    PT Home Exercise Plan MedBridge Access Code: [email or web address] (3/3, revised 3/23, revised 3/28)    Consulted and Agree with Plan of Care Patient           Patient will benefit from skilled therapeutic intervention in order to improve the following deficits and impairments:  Abnormal gait,Decreased activity tolerance,Decreased balance,Decreased coordination,Decreased endurance,Decreased knowledge of precautions,Decreased knowledge of use of DME,Decreased mobility,Decreased range of motion,Decreased safety awareness,Decreased strength,Difficulty walking,Increased fascial restricitons,Increased muscle spasms,Impaired perceived functional ability,Impaired flexibility,Improper body mechanics,Postural dysfunction,Pain  Visit Diagnosis: Pain in right hip  Stiffness of right hip, not elsewhere classified  Muscle weakness (generalized)  Difficulty in walking, not elsewhere classified     Problem List Patient Active Problem List   Diagnosis Date Noted  . Chronic right hip pain 09/18/2020  . Shortness of breath on exertion 09/18/2020  . Chronic pain syndrome 09/14/2020  . Chronic dyspnea 06/01/2020  . Pain of left calf 06/01/2020  . Ankle weakness 04/02/2020  . Chronic pain of both ankles 04/02/2020  . Acute midline thoracic back pain 10/14/2019  . Double vision 08/05/2019  . Weakness 08/05/2019  . Adjustment disorder with  depressed mood 08/05/2019  . Acute midline low back pain without sciatica 08/05/2019  . 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 Vision loss, bilateral 07/25/2019  . Myalgia 07/25/2019  . Polyarthritis 07/25/2019  . Multiple joint pain 07/20/2019  . Pneumonia of both lungs due to infectious organism 07/20/2019  . Abnormal liver function   . Aseptic meningitis   . Leukocytosis   . Acute respiratory failure with hypoxemia (HCC) 07/08/2019  . FUO (fever of unknown origin) 07/07/2019  . Headache 07/07/2019  . Rash 07/07/2019  . Sepsis (HCC) 07/06/2019  . Chronic heel pain, left 05/20/2018  . Hypertriglyceridemia 06/30/2016  . Ringworm 06/29/2016    Marry Guan, PT, MPT 11/20/2020, 10:33 AM  [facility] [address]  [address] [place], [place], [address] Phone: [phone]   Fax:  [phone]  Name: Raymond Owens MRN: [id number] Date of Birth: 12/23/1990

## 2020-11-28 ENCOUNTER — Telehealth: Payer: Self-pay

## 2020-11-28 ENCOUNTER — Ambulatory Visit: Payer: Medicaid Other

## 2020-11-28 ENCOUNTER — Encounter: Payer: Self-pay | Admitting: Sports Medicine

## 2020-11-28 NOTE — Telephone Encounter (Signed)
Letter faxed to number indicated below; fax confirmation obtained.

## 2020-11-28 NOTE — Telephone Encounter (Signed)
Letter written

## 2020-11-28 NOTE — Telephone Encounter (Signed)
Cicily with [employer] called to state that they need a letter stating the patient gives [place] permission to appeal the MRI on his behalf. I spoke with patient this afternoon and he did give verbal consent. The letter needs to include his DOB (09/03/1990) and his subscriber # ([id number]) for identification purposes. It needs to be faxed to [phone] no later than April 11th to proceed with the appeal.

## 2020-11-29 ENCOUNTER — Telehealth: Payer: Self-pay | Admitting: Neurology

## 2020-11-29 NOTE — Telephone Encounter (Signed)
Dr Antony Odea Able with  Disability [employer] called to see patient has had pulmonary function testing (recommended in last note from [facility]). Wants call back at [phone]. Patient has not yet had Spirometry. Dr. Aldona Lento made aware.

## 2020-12-05 ENCOUNTER — Ambulatory Visit: Payer: Medicaid Other | Attending: Physician Assistant

## 2020-12-05 ENCOUNTER — Other Ambulatory Visit: Payer: Self-pay

## 2020-12-05 DIAGNOSIS — M25651 Stiffness of right hip, not elsewhere classified: Secondary | ICD-10-CM | POA: Diagnosis not present

## 2020-12-05 DIAGNOSIS — M25551 Pain in right hip: Secondary | ICD-10-CM | POA: Insufficient documentation

## 2020-12-05 DIAGNOSIS — M6281 Muscle weakness (generalized): Secondary | ICD-10-CM | POA: Diagnosis not present

## 2020-12-05 DIAGNOSIS — R262 Difficulty in walking, not elsewhere classified: Secondary | ICD-10-CM | POA: Insufficient documentation

## 2020-12-05 NOTE — Therapy (Signed)
[facility]patient Rehabilitation [facility] [address]  [address] [place], [place], [address] Phone: [phone]   Fax:  [phone]  Physical Therapy Treatment  Patient Details  Name: Raymond Owens MRN: [id number] Date of Birth: 1991-03-04 Referring Provider (PT): Jomarie Longs, [place]   Encounter Date: 12/05/2020   PT End of Session - 12/05/20 1529    Visit Number 4    Number of Visits 9    Date for PT Re-Evaluation 12/27/20    Authorization Type Medicaid [employer]    PT Start Time 1447    PT Stop Time 1527    PT Time Calculation (min) 40 min    Activity Tolerance Patient tolerated treatment well;Patient limited by pain    Behavior During Therapy [facility] for tasks assessed/performed           Past Medical History:  Diagnosis Date  . Hyperlipidemia   . Myalgia 07/25/2019  . Polyarthritis 07/25/2019  . Still's disease (HCC)   . Vision loss, bilateral 07/25/2019    History reviewed. No pertinent surgical history.  There were no vitals filed for this visit.   Subjective Assessment - 12/05/20 1449    Subjective Pt feeling good, reporting more tolerance to exercises.    Pertinent History Adult-onset Still's disease (AOSD); polyarthritis; thoracic and lumbar back pain; chronic pain and weakness of B ankles with recurrent R ankle sprains and associated repeated falls; DOE    Diagnostic tests 02/10/20 - R hip x-ray: Negative.    Patient Stated Goals "not to have to think as much about how daily activities will affect his pain"                             [facility] Adult PT Treatment/Exercise - 12/05/20 0001      Knee/Hip Exercises: Stretches   Passive Hamstring Stretch Right;Left;2 reps;30 seconds    Passive Hamstring Stretch Limitations seated hip hinge    Hip Flexor Stretch Right;2 reps;30 seconds    Hip Flexor Stretch Limitations seated lunge position    ITB Stretch Right;Left;1 rep;30 seconds    ITB Stretch Limitations standing  latreral side-bend      Knee/Hip Exercises: Aerobic   Recumbent Bike L1x48min      Knee/Hip Exercises: Standing   Hip Abduction Stengthening;Both;5 reps;Knee straight    Abduction Limitations Y TB at knees    Hip Extension Stengthening;Both;5 reps;Knee straight    Extension Limitations Y TB at knees      Knee/Hip Exercises: Seated   Ball Squeeze 10x5"      Knee/Hip Exercises: Supine   Bridges Strengthening;Both;1 set;10 reps    Bridges Limitations + yellow TB hip ABD isometric    Straight Leg Raises Strengthening;Right;Left;5 reps    Other Supine Knee/Hip Exercises clamshell Y tband 5 reps    Other Supine Knee/Hip Exercises bent knee raise with Y tband 5 reps                  PT Education - 12/05/20 1547    Education Details HEP update: Access Code: [email or web address]    Person(s) Educated Patient    Methods Explanation;Demonstration;Verbal cues;Handout    Comprehension Verbalized understanding;Returned demonstration;Verbal cues required;Need further instruction            PT Short Term Goals - 11/14/20 1035      PT SHORT TERM GOAL #1   Title Patient will be independent with initial HEP    Status  On-going   11/14/20 - initial HEP revised   Target Date 11/22/20             PT Long Term Goals - 11/14/20 1036      PT LONG TERM GOAL #1   Title Patient will be independent with ongoing/advanced HEP for self-management at home    Status On-going    Target Date 12/27/20      PT LONG TERM GOAL #2   Title Patient to improve R hip AROM to [facility] without pain reduced by >/= 50%    Status On-going    Target Date 12/27/20      PT LONG TERM GOAL #3   Title Patient will demonstrate improved B LE strength to >/= 4+/5 for improved stability and ease of mobility    Status On-going    Target Date 12/27/20      PT LONG TERM GOAL #4   Title Patient will improve standing and/or walking tolerance to >/= 45 minutes w/o pain interference to increase toleranc for normal daily activities  such as grocery shopping    Status On-going    Target Date 12/27/20                 Plan - 12/05/20 1533    Clinical Impression Statement Pt had improvement in joint pain today although slight reports of pain in R ant hip but he noted that he could tolerate the exercises. Good response from exercises today with lower repetitions and increased WBing exercises. He demonstrated increased tolerance with standing today with only mild complaints of pain in R hip. He noted relief with the stretches and showed significant tightness in his R hamstring. Updated HEP with standing hip exercise and lowered repetitions to increase pt tolerance.    Personal Factors and Comorbidities Time since onset of injury/illness/exacerbation;Past/Current Experience;Comorbidity 3+    Comorbidities Adult-onset Still's disease (AOSD); polyarthritis; thoracic and lumbar back pain; chronic pain and weakness of B ankles with recurrent R ankle sprains and associated repeated falls; DOE    PT Frequency 1x / week    PT Duration 8 weeks    PT Treatment/Interventions ADLs/Self Care Home Management;Cryotherapy;Electrical Stimulation;Iontophoresis 4mg /ml Dexamethasone;Moist Heat;Ultrasound;DME Instruction;Gait training;Stair training;Functional mobility training;Therapeutic activities;Therapeutic exercise;Balance training;Neuromuscular re-education;Manual techniques;Passive range of motion;Dry needling;Energy conservation;Taping;Joint Manipulations    PT Next Visit Plan progress gentle ROM and strengthening; balance and proprioceptive training - regular HEP updates d/t 1x/wk frequency    PT Home Exercise Plan MedBridge Access Code: (3/3, revised 3/23, revised 3/28)    Consulted and Agree with Plan of Care Patient           Patient will benefit from skilled therapeutic intervention in order to improve the following deficits and impairments:  Abnormal gait,Decreased activity tolerance,Decreased balance,Decreased  coordination,Decreased endurance,Decreased knowledge of precautions,Decreased knowledge of use of DME,Decreased mobility,Decreased range of motion,Decreased safety awareness,Decreased strength,Difficulty walking,Increased fascial restricitons,Increased muscle spasms,Impaired perceived functional ability,Impaired flexibility,Improper body mechanics,Postural dysfunction,Pain  Visit Diagnosis: Pain in right hip  Stiffness of right hip, not elsewhere classified  Muscle weakness (generalized)  Difficulty in walking, not elsewhere classified     Problem List Patient Active Problem List   Diagnosis Date Noted  . Chronic right hip pain 09/18/2020  . Shortness of breath on exertion 09/18/2020  . Chronic pain syndrome 09/14/2020  . Chronic dyspnea 06/01/2020  . Pain of left calf 06/01/2020  . Ankle weakness 04/02/2020  . Chronic pain of both ankles 04/02/2020  . Acute midline thoracic back pain 10/14/2019  .  Double vision 08/05/2019  . Weakness 08/05/2019  . Adjustment disorder with depressed mood 08/05/2019  . Acute midline low back pain without sciatica 08/05/2019  . 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 Vision loss, bilateral 07/25/2019  . Myalgia 07/25/2019  . Polyarthritis 07/25/2019  . Multiple joint pain 07/20/2019  . Pneumonia of both lungs due to infectious organism 07/20/2019  . Abnormal liver function   . Aseptic meningitis   . Leukocytosis   . Acute respiratory failure with hypoxemia (HCC) 07/08/2019  . FUO (fever of unknown origin) 07/07/2019  . Headache 07/07/2019  . Rash 07/07/2019  . Sepsis (HCC) 07/06/2019  . Chronic heel pain, left 05/20/2018  . Hypertriglyceridemia 06/30/2016  . Ringworm 06/29/2016    Darleene Cleaver, PTA 12/05/2020, 3:57 PM  [facility] [address]  [address] [place], [place], [address] Phone: [phone]   Fax:  [phone]  Name: Raymond Owens MRN: [id number] Date of  Birth: October 16, 1990

## 2020-12-10 DIAGNOSIS — M0820 Juvenile rheumatoid arthritis with systemic onset, unspecified site: Secondary | ICD-10-CM | POA: Diagnosis not present

## 2020-12-10 DIAGNOSIS — R0600 Dyspnea, unspecified: Secondary | ICD-10-CM | POA: Diagnosis not present

## 2020-12-10 DIAGNOSIS — R5381 Other malaise: Secondary | ICD-10-CM | POA: Diagnosis not present

## 2020-12-10 DIAGNOSIS — R5382 Chronic fatigue, unspecified: Secondary | ICD-10-CM | POA: Diagnosis not present

## 2020-12-13 ENCOUNTER — Other Ambulatory Visit: Payer: Self-pay

## 2020-12-13 ENCOUNTER — Encounter: Payer: Self-pay | Admitting: Physical Therapy

## 2020-12-13 ENCOUNTER — Ambulatory Visit: Payer: Medicaid Other | Admitting: Physical Therapy

## 2020-12-13 DIAGNOSIS — R262 Difficulty in walking, not elsewhere classified: Secondary | ICD-10-CM

## 2020-12-13 DIAGNOSIS — M6281 Muscle weakness (generalized): Secondary | ICD-10-CM

## 2020-12-13 DIAGNOSIS — M25551 Pain in right hip: Secondary | ICD-10-CM

## 2020-12-13 DIAGNOSIS — M25651 Stiffness of right hip, not elsewhere classified: Secondary | ICD-10-CM

## 2020-12-13 NOTE — Therapy (Signed)
[facility] Outpatient Rehabilitation [facility] [address]  [address] [place], [place], [address] Phone: [phone]   Fax:  [phone]  Physical Therapy Treatment  Patient Details  Name: Raymond Owens MRN: [id number] Date of Birth: April 27, 1991 Referring Provider (PT): Jomarie Longs, [place]   Encounter Date: 12/13/2020   PT End of Session - 12/13/20 1148    Visit Number 5    Number of Visits 9    Date for PT Re-Evaluation 12/27/20    Authorization Type Medicaid Healthy [employer]    Authorization Time Period 3/7 - 5/5    Authorization - Visit Number 4    Authorization - Number of Visits 8    PT Start Time 1107    PT Stop Time 1147    PT Time Calculation (min) 40 min    Activity Tolerance Patient tolerated treatment well    Behavior During Therapy [facility] for tasks assessed/performed           Past Medical History:  Diagnosis Date  . Hyperlipidemia   . Myalgia 07/25/2019  . Polyarthritis 07/25/2019  . Still's disease (HCC)   . Vision loss, bilateral 07/25/2019    History reviewed. No pertinent surgical history.  There were no vitals filed for this visit.   Subjective Assessment - 12/13/20 1109    Subjective Pt reports he feels really good, the exercises have been going well when he is able to get to them. Feels tightness in R hamstring but feels more flexibility elsewhere. Did have  a little soreness in his hips following the previous treatment.    Pertinent History Adult-onset Still's disease (AOSD); polyarthritis; thoracic and lumbar back pain; chronic pain and weakness of B ankles with recurrent R ankle sprains and associated repeated falls; DOE    Diagnostic tests 02/10/20 - R hip x-ray: Negative.    Patient Stated Goals "not to have to think as much about how daily activities will affect his pain"    Currently in Pain? No/denies    Pain Score 0-No pain                             [facility] Adult PT Treatment/Exercise - 12/13/20 1108       Knee/Hip Exercises: Stretches   Passive Hamstring Stretch Right;Left;2 reps;30 seconds    Passive Hamstring Stretch Limitations seated hip hinge    ITB Stretch Right;Left;30 seconds;1 rep    ITB Stretch Limitations Supine with strap assist      Knee/Hip Exercises: Aerobic   Recumbent Bike L1x15min      Knee/Hip Exercises: Standing   SLS 1 x 30 seconds each foot   Standing at the counter, no HHA needed   SLS with Vectors Star pattern, 5 cones; B 10 x 5 w HHA      Knee/Hip Exercises: Supine   Bridges Both;Strengthening;2 sets;5 reps   3 second holds   Bridges with [employer] 1 set;5 reps   3 second holds   Straight Leg Raises Strengthening;Right;Left;10 reps   3 second holds     Knee/Hip Exercises: Sidelying   Hip ABduction Right;Left;5 reps;Strengthening;2 sets   1 x 5 no weight; 1 x 5 2#; 3 second holds   Clams R/L clam 1 x 10 w/3 second holds                    PT Short Term Goals - 12/13/20 1150      PT  SHORT TERM GOAL #1   Title Patient will be independent with initial HEP    Status Achieved   12/13/2020   Target Date 11/22/20             PT Long Term Goals - 11/14/20 1036      PT LONG TERM GOAL #1   Title Patient will be independent with ongoing/advanced HEP for self-management at home    Status On-going    Target Date 12/27/20      PT LONG TERM GOAL #2   Title Patient to improve R hip AROM to [facility] without pain reduced by >/= 50%    Status On-going    Target Date 12/27/20      PT LONG TERM GOAL #3   Title Patient will demonstrate improved B LE strength to >/= 4+/5 for improved stability and ease of mobility    Status On-going    Target Date 12/27/20      PT LONG TERM GOAL #4   Title Patient will improve standing and/or walking tolerance to >/= 45 minutes w/o pain interference to increase toleranc for normal daily activities such as grocery shopping    Status On-going    Target Date 12/27/20                 Plan - 12/13/20 1149     Clinical Impression Statement Raymond Owens reports his R hip is doing better. He has more motion with it, hears less crepitus, and feels his strength is improving. He is following his HEP and does it when he has time. He presents with improved endurance and ROM in his hips. He can perform more reps with correct form, and he has achieved STG #1. He did not struggle to complete 30 second SLS or toe taps. He is ready to progress his LE strengthening and ROM exercises and will continue to benefit from skilled PT to do so.    Personal Factors and Comorbidities Time since onset of injury/illness/exacerbation;Past/Current Experience;Comorbidity 3+    Comorbidities Adult-onset Still's disease (AOSD); polyarthritis; thoracic and lumbar back pain; chronic pain and weakness of B ankles with recurrent R ankle sprains and associated repeated falls; DOE    PT Frequency 1x / week    PT Duration 8 weeks    PT Treatment/Interventions ADLs/Self Care Home Management;Cryotherapy;Electrical Stimulation;Iontophoresis 4mg /ml Dexamethasone;Moist Heat;Ultrasound;DME Instruction;Gait training;Stair training;Functional mobility training;Therapeutic activities;Therapeutic exercise;Balance training;Neuromuscular re-education;Manual techniques;Passive range of motion;Dry needling;Energy conservation;Taping;Joint Manipulations    PT Next Visit Plan Progress ROM and strengthening; Dynamic balance proprioceptive training - Update HEP to include more functional LE strengthening    PT Home Exercise Plan MedBridge Access Code: (3/3, revised 3/23, revised 3/28)    Consulted and Agree with Plan of Care Patient           Patient will benefit from skilled therapeutic intervention in order to improve the following deficits and impairments:  Abnormal gait,Decreased activity tolerance,Decreased balance,Decreased coordination,Decreased endurance,Decreased knowledge of precautions,Decreased knowledge of use of DME,Decreased mobility,Decreased  range of motion,Decreased safety awareness,Decreased strength,Difficulty walking,Increased fascial restricitons,Increased muscle spasms,Impaired perceived functional ability,Impaired flexibility,Improper body mechanics,Postural dysfunction,Pain  Visit Diagnosis: Pain in right hip  Stiffness of right hip, not elsewhere classified  Muscle weakness (generalized)  Difficulty in walking, not elsewhere classified     Problem List Patient Active Problem List   Diagnosis Date Noted  . Chronic right hip pain 09/18/2020  . Shortness of breath on exertion 09/18/2020  . Chronic pain syndrome 09/14/2020  . Chronic dyspnea 06/01/2020  .  Pain of left calf 06/01/2020  . Ankle weakness 04/02/2020  . Chronic pain of both ankles 04/02/2020  . Acute midline thoracic back pain 10/14/2019  . Double vision 08/05/2019  . Weakness 08/05/2019  . Adjustment disorder with depressed mood 08/05/2019  . Acute midline low back pain without sciatica 08/05/2019  . 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 Vision loss, bilateral 07/25/2019  . Myalgia 07/25/2019  . Polyarthritis 07/25/2019  . Multiple joint pain 07/20/2019  . Pneumonia of both lungs due to infectious organism 07/20/2019  . Abnormal liver function   . Aseptic meningitis   . Leukocytosis   . Acute respiratory failure with hypoxemia (HCC) 07/08/2019  . FUO (fever of unknown origin) 07/07/2019  . Headache 07/07/2019  . Rash 07/07/2019  . Sepsis (HCC) 07/06/2019  . Chronic heel pain, left 05/20/2018  . Hypertriglyceridemia 06/30/2016  . Ringworm 06/29/2016    Janalyn Harder SPT 12/13/2020, 12:19 PM  [facility] [address]  [address] [place], [place], [address] Phone: [phone]   Fax:  [phone]  Name: Raymond Owens MRN: [id number] Date of Birth: [birth year]

## 2020-12-19 ENCOUNTER — Other Ambulatory Visit: Payer: Self-pay | Admitting: Sports Medicine

## 2020-12-19 DIAGNOSIS — Z1389 Encounter for screening for other disorder: Secondary | ICD-10-CM

## 2020-12-19 NOTE — Telephone Encounter (Signed)
Patient has been scheduled for MRI.

## 2020-12-20 ENCOUNTER — Ambulatory Visit (INDEPENDENT_AMBULATORY_CARE_PROVIDER_SITE_OTHER): Payer: Medicaid Other

## 2020-12-20 ENCOUNTER — Other Ambulatory Visit: Payer: Self-pay

## 2020-12-20 DIAGNOSIS — Z01818 Encounter for other preprocedural examination: Secondary | ICD-10-CM | POA: Diagnosis not present

## 2020-12-20 DIAGNOSIS — Z1389 Encounter for screening for other disorder: Secondary | ICD-10-CM | POA: Diagnosis not present

## 2020-12-20 IMAGING — DX DG ORBITS FOR FOREIGN BODY
2 series · 2 of 2 positions shown · non-contrast
Comparison: None.

CLINICAL DATA: Metal working/exposure; clearance prior to MRI

EXAM:
ORBITS FOR FOREIGN BODY - 2 VIEW

[orbits waters (1 of 2)]
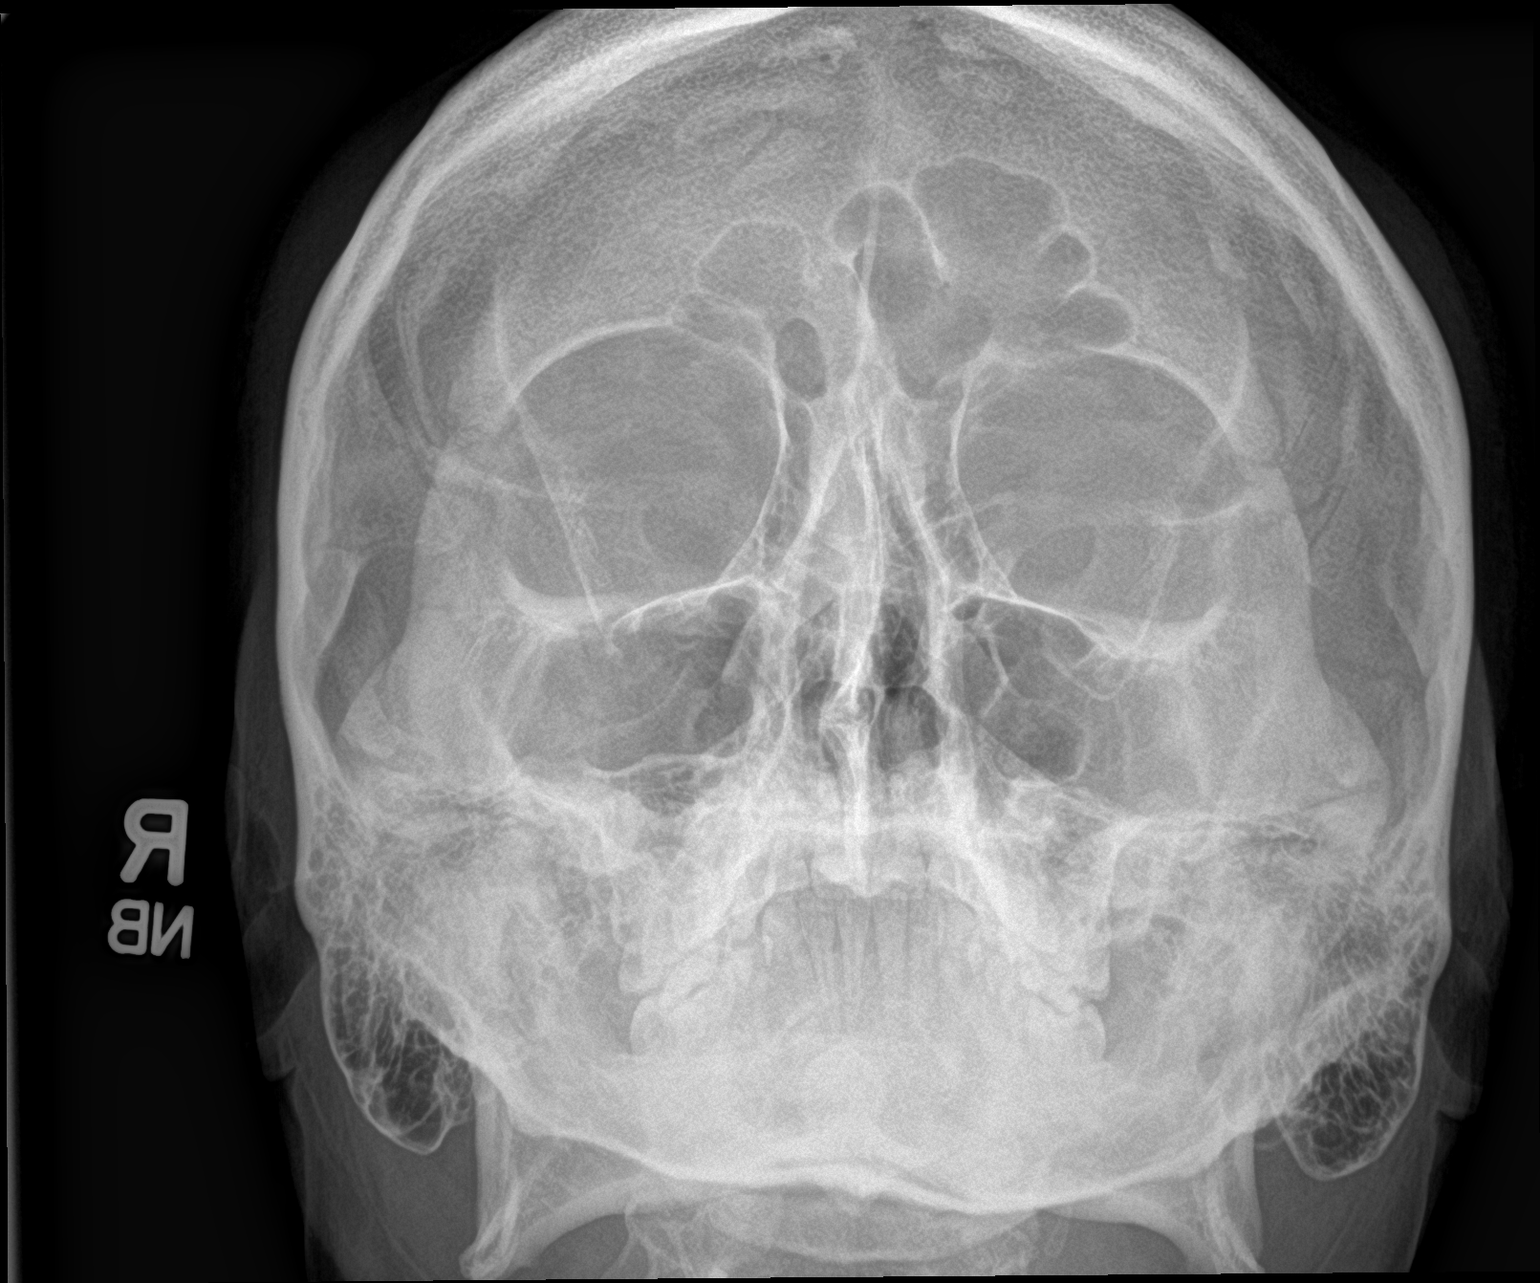

[orbits waters (2 of 2)]
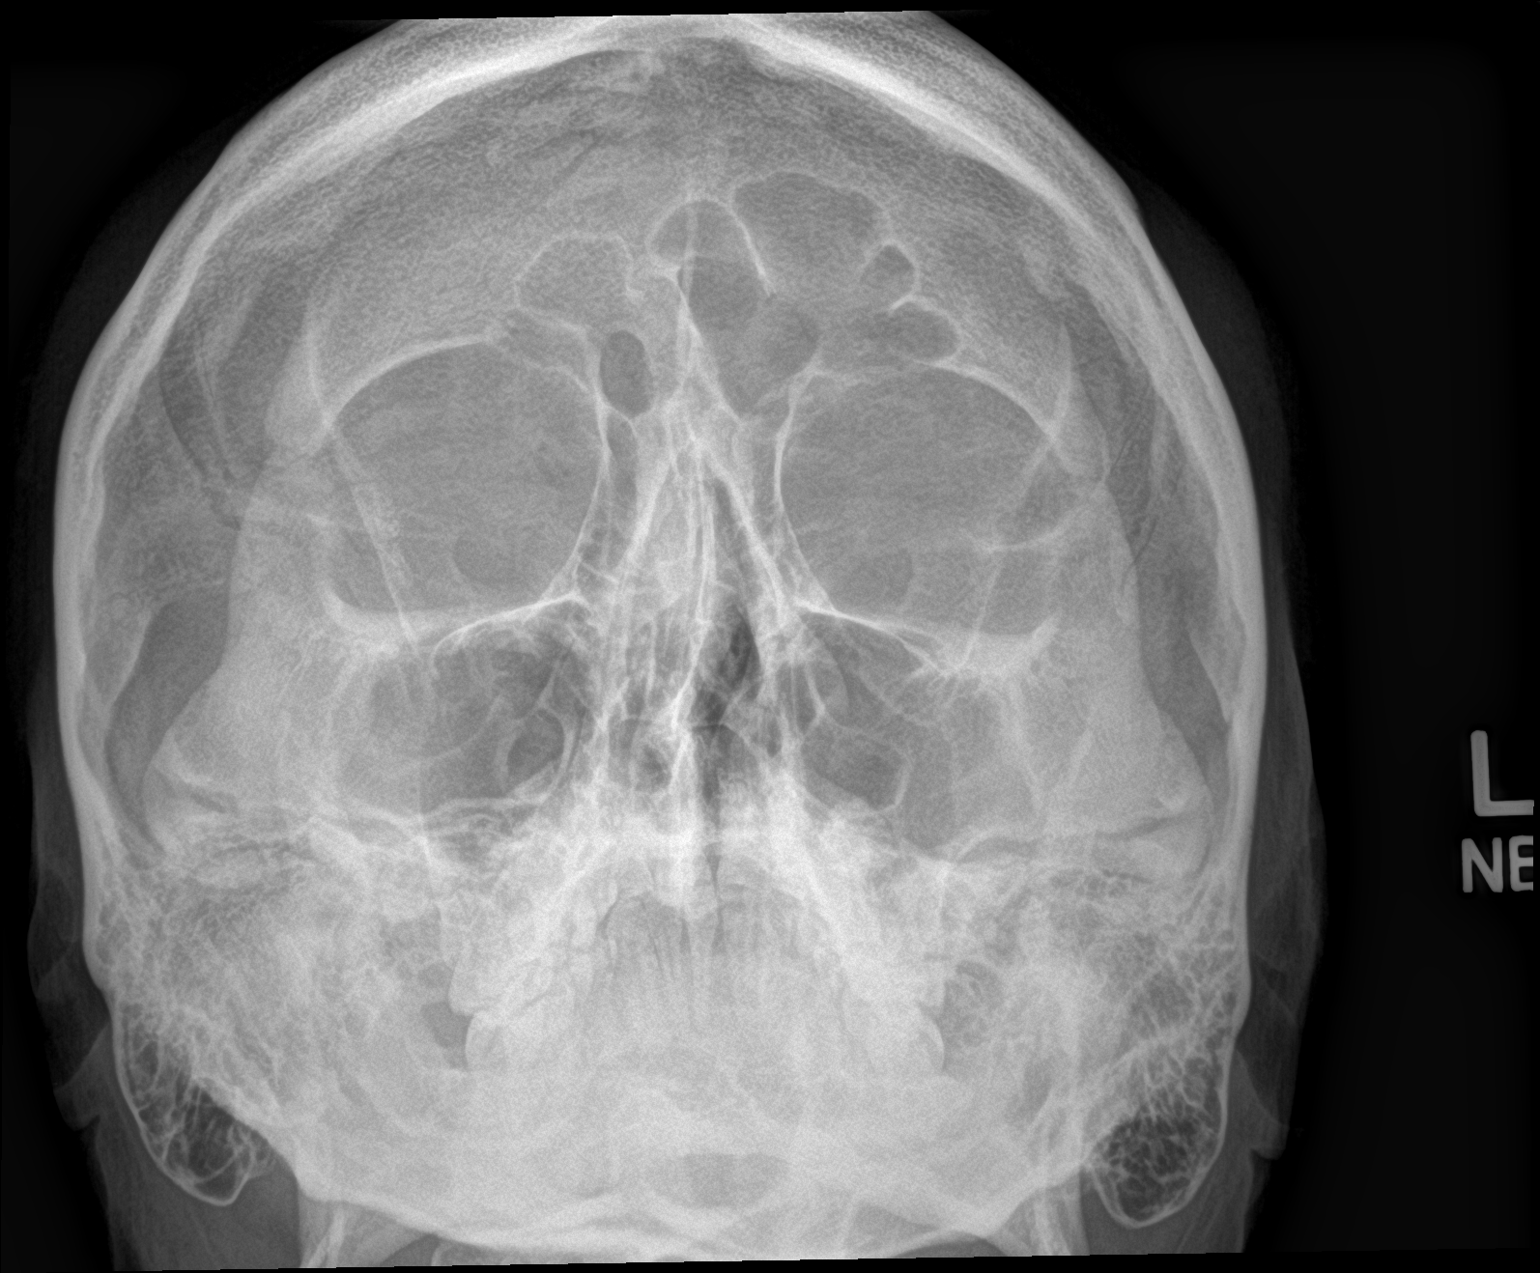

[2 of 2 positions shown; findings below may reference images not displayed]

FINDINGS: Water's views with eyes deviated toward the right and toward the
left obtained. No intraorbital radiopaque foreign body. No fracture
or dislocation. Paranasal sinuses and mastoids clear.
IMPRESSION: No evidence of metallic foreign body within the orbits.

## 2020-12-24 ENCOUNTER — Other Ambulatory Visit: Payer: Self-pay

## 2020-12-24 ENCOUNTER — Ambulatory Visit: Payer: Medicaid Other | Admitting: Sports Medicine

## 2020-12-24 ENCOUNTER — Ambulatory Visit (INDEPENDENT_AMBULATORY_CARE_PROVIDER_SITE_OTHER): Payer: Medicaid Other

## 2020-12-24 DIAGNOSIS — M25551 Pain in right hip: Secondary | ICD-10-CM

## 2020-12-24 DIAGNOSIS — G8929 Other chronic pain: Secondary | ICD-10-CM | POA: Diagnosis not present

## 2020-12-24 DIAGNOSIS — S73191A Other sprain of right hip, initial encounter: Secondary | ICD-10-CM | POA: Diagnosis not present

## 2020-12-24 DIAGNOSIS — M533 Sacrococcygeal disorders, not elsewhere classified: Secondary | ICD-10-CM | POA: Diagnosis not present

## 2020-12-24 DIAGNOSIS — M25451 Effusion, right hip: Secondary | ICD-10-CM | POA: Diagnosis not present

## 2020-12-24 IMAGING — MR MR HIP*R* W/CM
6 series · 40 of 40 positions shown · IV contrast (gadavist)
Comparison: Right hip x-rays dated [DATE].

CLINICAL DATA: Chronic right hip pain.  No prior surgery.

EXAM:
MRI OF THE RIGHT HIP WITH CONTRAST
TECHNIQUE: Multiplanar, multisequence MR imaging was performed following the
administration of intravenous contrast.
CONTRAST:  1mL GADAVIST GADOBUTROL 1 MMOL/ML IV SOLN

[Series 4: STIR · coronal · 4.0mm · 1.33mm/px · 10 of 41 slices shown]
[im 1/41]
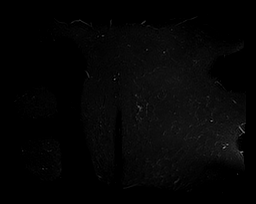
[im 5/41]
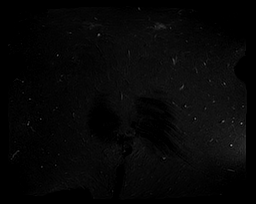
[im 9/41]
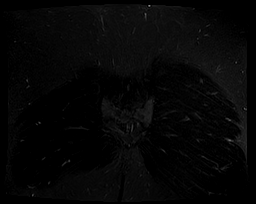
[im 14/41]
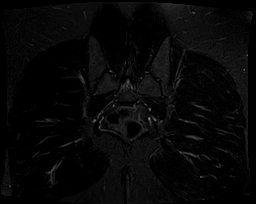
[im 18/41]
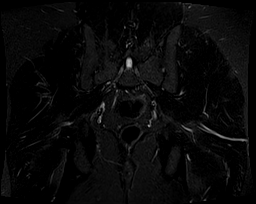
[im 23/41]
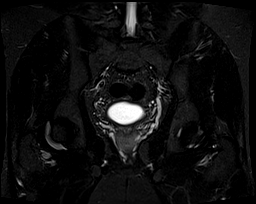
[im 27/41]
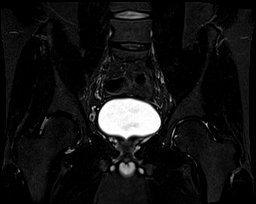
[im 32/41]
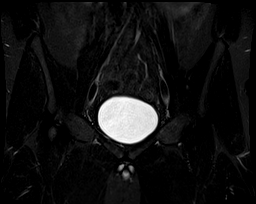
[im 36/41]
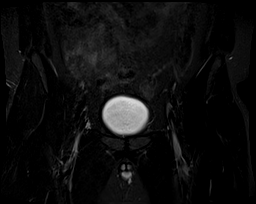
[im 41/41]
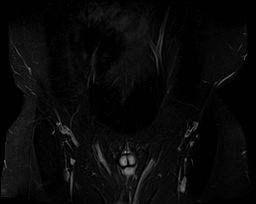

[Series 5: T1 · coronal · 4.0mm · 1.33mm/px · 9 of 41 slices shown]
[im 1/41]
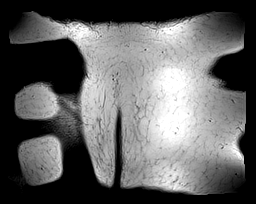
[im 6/41]
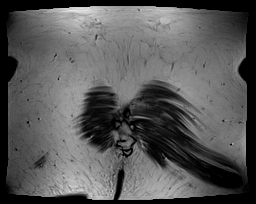
[im 11/41]
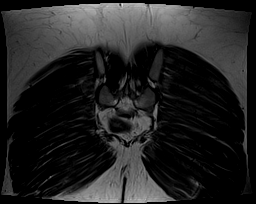
[im 16/41]
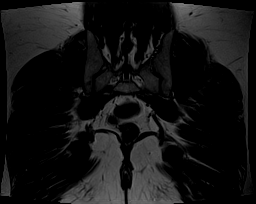
[im 21/41]
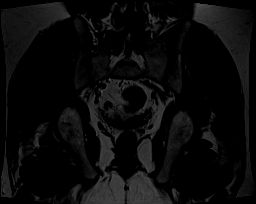
[im 26/41]
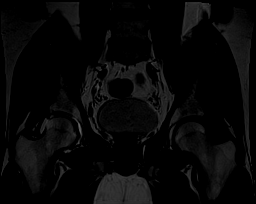
[im 31/41]
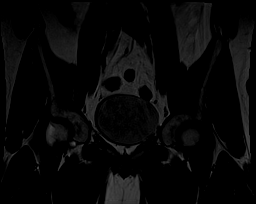
[im 36/41]
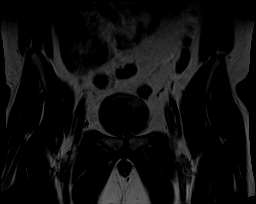
[im 41/41]
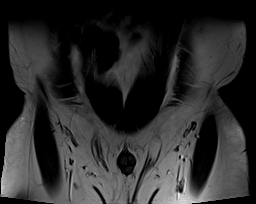

[Series 6: T1 fat-sat · axial · 4.0mm · 1.48mm/px · z∈[-119,+16]mm · 6 of 28 slices shown (1 of 4)]
[im 1/28]
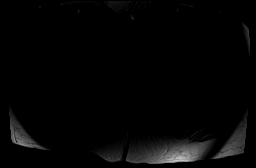
[im 6/28]
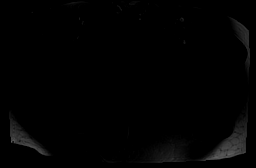
[im 11/28]
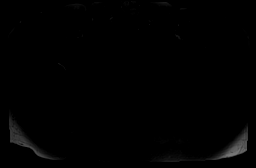
[im 17/28]
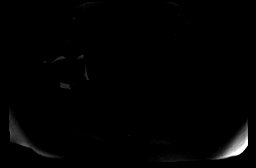
[im 22/28]
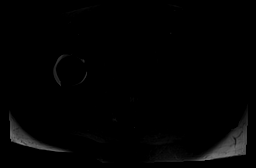
[im 28/28]
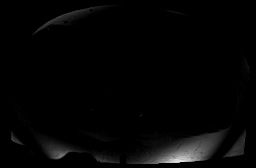

[Series 7: T1 fat-sat · oblique · 4.0mm · 0.70mm/px · 5 of 20 slices shown (2 of 4)]
[im 1/20]
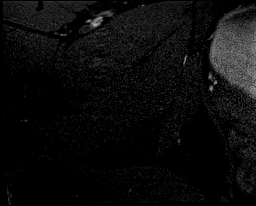
[im 5/20]
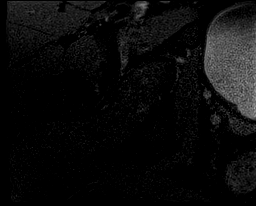
[im 10/20]
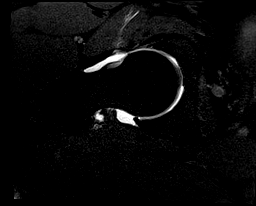
[im 15/20]
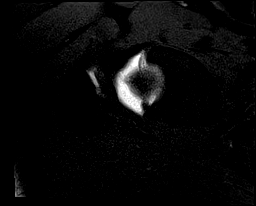
[im 20/20]
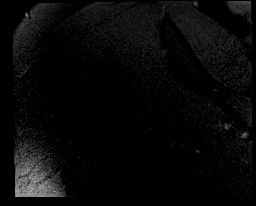

[Series 8: T1 fat-sat · coronal · 4.0mm · 0.70mm/px · 5 of 23 slices shown (3 of 4)]
[im 1/23]
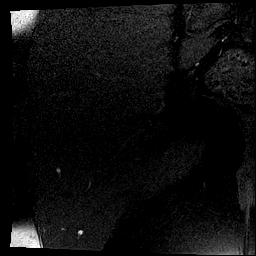
[im 6/23]
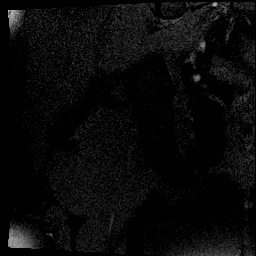
[im 12/23]
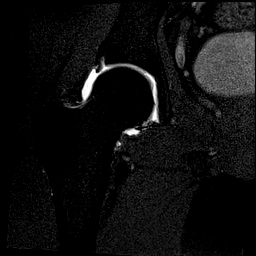
[im 17/23]
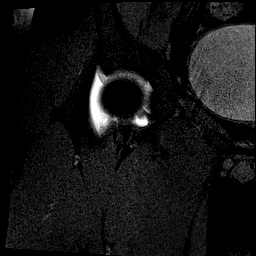
[im 23/23]
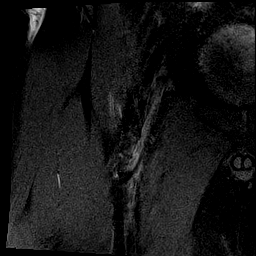

[Series 9: T1 fat-sat · sagittal · 4.0mm · 0.70mm/px · 5 of 24 slices shown (4 of 4)]
[im 1/24]
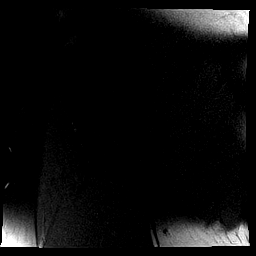
[im 6/24]
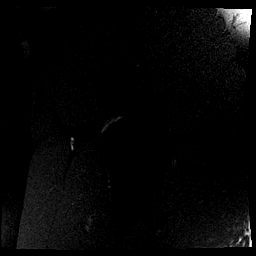
[im 12/24]
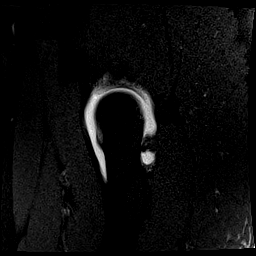
[im 18/24]
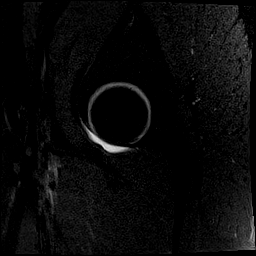
[im 24/24]
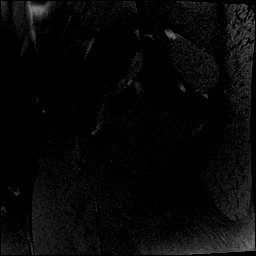

[40 of 40 positions shown; findings below may reference images not displayed]

FINDINGS: Bones: There is no evidence of acute fracture, dislocation or
avascular necrosis. No focal bone lesion. The visualized sacroiliac
joints and symphysis pubis appear normal.

Articular cartilage and labrum

Articular cartilage: No focal chondral defect or subchondral signal
abnormality identified.

Labrum: Large right anterior superior and posterior superior labral
tear (series 7, images 5-10; series 9, images 14-16; series 8,
images 11-16).

Joint or bursal effusion

Joint effusion: The right hip joint is distended with
intra-articular contrast. No significant left hip joint effusion.

Bursae: No focal periarticular fluid collection.

Muscles and tendons

Muscles and tendons: The visualized gluteus, hamstring and iliopsoas
tendons appear normal. No muscle edema or atrophy.

Other findings

Miscellaneous: The visualized internal pelvic contents appear
unremarkable.
IMPRESSION: 1. Large right anterior superior and posterior superior labral tear.

## 2020-12-24 MED ORDER — GADOBUTROL 1 MMOL/ML IV SOLN
1.0000 mL | Freq: Once | INTRAVENOUS | Status: AC | PRN
  Administered 2020-12-24: 1 mL via INTRAVENOUS

## 2020-12-24 NOTE — Progress Notes (Signed)
    Procedures performed today:    Procedure: Real-time Ultrasound Guided gadolinium contrast injection of right hip joint Device: Samsung HS60  Verbal informed consent obtained.  Time-out conducted.  Noted no overlying erythema, induration, or other signs of local infection.  Skin prepped in a sterile fashion.  Local anesthesia: Topical Ethyl chloride.  With sterile technique and under real time ultrasound guidance: 22-gauge spinal needle advanced to the femoral head/neck junction, contacted bone and then injected 1 cc kenalog 40, 2 cc lidocaine, 2 cc bupivacaine, syringe switched and 0.1 cc gadolinium injected, syringe again switched and 10 cc sterile saline used to fully distend the joint. Joint visualized and capsule seen distending confirming intra-articular placement of contrast material and medication. Completed without difficulty  Advised to call if fevers/chills, erythema, induration, drainage, or persistent bleeding.  Images permanently stored in PACS Impression: Technically successful ultrasound guided gadolinium contrast injection for MR arthrography.  Please see separate MR arthrogram report.   Independent interpretation of notes and tests performed by another provider:   None.  Brief History, Exam, Impression, and Recommendations:    Chronic right hip pain Injection today for MR arthrography, please see prior note for further details.    ___________________________________________ Ihor Austin. Benjamin Stain, M.D., ABFM., CAQSM. Primary Care and Sports Medicine [facility]  Adjunct Instructor of Family Medicine  [employer]

## 2020-12-24 NOTE — Assessment & Plan Note (Signed)
Injection today for MR arthrography, please see prior note for further details.

## 2020-12-26 ENCOUNTER — Telehealth: Payer: Self-pay | Admitting: Sports Medicine

## 2020-12-26 ENCOUNTER — Ambulatory Visit: Payer: Medicaid Other | Admitting: Physician Assistant

## 2020-12-26 DIAGNOSIS — G8929 Other chronic pain: Secondary | ICD-10-CM

## 2020-12-26 DIAGNOSIS — S73191S Other sprain of right hip, sequela: Secondary | ICD-10-CM

## 2020-12-26 NOTE — Telephone Encounter (Signed)
I will go ahead and place the referral to the hip center.

## 2020-12-26 NOTE — Telephone Encounter (Signed)
Chrystal   Drequan Ironside called back and said he was returning a call about a referral Dr.[email or web address] wanted to place and he would like to go ahead with the referral.   Arline Asp

## 2020-12-27 ENCOUNTER — Other Ambulatory Visit: Payer: Self-pay

## 2020-12-27 ENCOUNTER — Ambulatory Visit: Payer: Medicaid Other | Attending: Physician Assistant | Admitting: Physical Therapy

## 2020-12-27 ENCOUNTER — Encounter: Payer: Self-pay | Admitting: Physical Therapy

## 2020-12-27 DIAGNOSIS — R262 Difficulty in walking, not elsewhere classified: Secondary | ICD-10-CM | POA: Diagnosis not present

## 2020-12-27 DIAGNOSIS — M25551 Pain in right hip: Secondary | ICD-10-CM | POA: Diagnosis not present

## 2020-12-27 DIAGNOSIS — M25651 Stiffness of right hip, not elsewhere classified: Secondary | ICD-10-CM | POA: Insufficient documentation

## 2020-12-27 DIAGNOSIS — M6281 Muscle weakness (generalized): Secondary | ICD-10-CM | POA: Insufficient documentation

## 2020-12-27 NOTE — Therapy (Addendum)
[facility] [address]  [place], [place], [address] Phone: [phone]   Fax:  [phone]  Physical Therapy Treatment / Progress Note / Discharge Summary  Patient Details  Name: Raymond Owens MRN: [id number] Date of Birth: 05-21-1991 Referring Provider (PT): Donella Stade, PA-C  Progress Note  Reporting Period 10/25/2020 to 12/27/2020  See note below for Objective Data and Assessment of Progress/Goals.      Encounter Date: 12/27/2020   PT End of Session - 12/27/20 1147     Visit Number 6    Number of Visits 9    Date for PT Re-Evaluation 12/27/20    Authorization Type Medicaid [employer]    Authorization Time Period 10/29/20 - 12/27/20    Authorization - Visit Number 5    Authorization - Number of Visits 8    PT Start Time 1102    PT Stop Time 1147    PT Time Calculation (min) 45 min    Activity Tolerance Patient tolerated treatment well    Behavior During Therapy [facility] for tasks assessed/performed             Past Medical History:  Diagnosis Date   Hyperlipidemia    Myalgia 07/25/2019   Polyarthritis 07/25/2019   Still's disease ([place])    Vision loss, bilateral 07/25/2019    History reviewed. No pertinent surgical history.  There were no vitals filed for this visit.   Subjective Assessment - 12/27/20 1107     Subjective Pt reports weakness in his knees lately. His MRI came back and he has a large labral tear in the superior anterior and posterior R hip.    Pertinent History Adult-onset Still's disease (AOSD); polyarthritis; thoracic and lumbar back pain; chronic pain and weakness of B ankles with recurrent R ankle sprains and associated repeated falls; DOE    Diagnostic tests 02/10/20 - R hip x-ray: Negative.  12/24/20 - R hip MRI: Large right anterior superior and posterior superior labral tear.    Patient Stated Goals "not to have to think as much about how daily activities will affect his pain"     Currently in Pain? Yes    Pain Score 7     Pain Location Hip    Pain Orientation Right    Pain Type Chronic pain                [facility] PT Assessment - 12/27/20 1116       Assessment   Medical Diagnosis Chronic R hip pain    Referring Provider (PT) Donella Stade, PA-C    Onset Date/Surgical Date --   Nov 2020   Hand Dominance Right    Next MD Visit 03/15/21 with Iran Planas PCP; 01/23/21 with Dr. Dianah Field    Prior Therapy none for current problem; remote h/o PT for LBP      AROM   Right Hip Flexion --   limited to ~90 passively; this is painful in the anterior.     Strength   Right Hip Flexion 4/5    Right Hip Extension 5/5    Right Hip External Rotation  4+/5    Right Hip Internal Rotation 4/5   Painful   Right Hip ABduction 5/5    Right Hip ADduction 5/5    Left Hip Flexion 5/5    Left Hip Extension 5/5    Left Hip External Rotation 4+/5    Left Hip Internal Rotation 4+/5  Left Hip ABduction 5/5    Left Hip ADduction 5/5    Right Knee Flexion 4+/5    Right Knee Extension 5/5    Left Knee Flexion 5/5    Left Knee Extension 5/5    Right Ankle Dorsiflexion 4+/5    Right Ankle Plantar Flexion 5/5    Left Ankle Dorsiflexion 4+/5    Left Ankle Plantar Flexion 5/5                           [facility] Adult PT Treatment/Exercise - 12/27/20 1101       Ambulation/Gait   Ambulation/Gait Yes    Ambulation/Gait Assistance 5: Supervision    Ambulation Distance (Feet) 200 Feet    Assistive device Straight cane    Gait Pattern Step-through pattern;Within Functional Limits    Ambulation Surface Level;Indoor    Stairs Yes    Stairs Assistance 5: Supervision    Stair Management Technique One rail Right;Forwards;With cane    Number of Stairs 8    Height of Stairs 7   inches     Exercises   Exercises Knee/Hip      Knee/Hip Exercises: Aerobic   Recumbent Bike L1x54mn      Knee/Hip Exercises: Supine   Straight Leg Raises Right;1 set;10 reps;AROM       Knee/Hip Exercises: Sidelying   Hip ABduction Right;1 set;10 reps;AROM                    PT Education - 12/27/20 1155     Education Details HEP update: Access Code: [email or web address] Education on use of SPC for gait and stair ambulation    Person(s) Educated Patient    Methods Explanation;Tactile cues;Demonstration;Verbal cues;Handout    Comprehension Tactile cues required;Verbal cues required;Returned demonstration;Verbalized understanding              PT Short Term Goals - 12/13/20 1150       PT SHORT TERM GOAL #1   Title Patient will be independent with initial HEP    Status Achieved   12/13/2020   Target Date 11/22/20               PT Long Term Goals - 12/27/20 1115       PT LONG TERM GOAL #1   Title Patient will be independent with ongoing/advanced HEP for self-management at home    Status Achieved   12/27/2020   Target Date 12/27/20      PT LONG TERM GOAL #2   Title Patient to improve R hip [facility]OM [facility] reduced by >/= 50%    Status On-going    Target Date 12/27/20      PT LONG TERM GOAL #3   Title Patient will demonstrate improved B LE strength to >/= 4+/5 for improved stability and ease of mobility    Status Partially Met   R hip flexion at 4+/5 (12/27/2020)   Target Date 12/27/20      PT LONG TERM GOAL #4   Title Patient will improve standing and/or walking tolerance to >/= 45 minutes w/o pain interference to increase toleranc for normal daily activities such as grocery shopping    Status Partially Met   Reports ability to move 20-25 minutes without irritation (12/27/2020)   Target Date 12/27/20                   Plan - 12/27/20 1156     Clinical Impression  Statement Raymond Owens reports his MRI came back and he has a superior anterior-posterior R labral tear and he has been referred for surgical consult. He is having some weakness in his knees as well. He has achieved LTG #1 and his LE strength has met LTG #3 except for R hip flexion  and he has progressed to be able to walk 20-25 minutes but he is limited by pain with R hip ROM, hence these goals are partially met or on-going. He was educated on use of SCP as preparation for possible upcoming surgery and to unload his R hip when irritated. His new plan for PT is a 30 day hold with the option to schedule visits as needed following discussion with his surgeon.    Personal Factors and Comorbidities Time since onset of injury/illness/exacerbation;Past/Current Experience;Comorbidity 3+    Comorbidities Adult-onset Still's disease (AOSD); polyarthritis; thoracic and lumbar back pain; chronic pain and weakness of B ankles with recurrent R ankle sprains and associated repeated falls; DOE    PT Frequency --    PT Duration --    PT Treatment/Interventions ADLs/Self Care Home Management;Cryotherapy;Electrical Stimulation;Iontophoresis 26m/ml Dexamethasone;Moist Heat;Ultrasound;DME Instruction;Gait training;Stair training;Functional mobility training;Therapeutic activities;Therapeutic exercise;Balance training;Neuromuscular re-education;Manual techniques;Passive range of motion;Dry needling;Energy conservation;Taping;Joint Manipulations    PT Next Visit Plan 30 day hold    PT Home Exercise Plan MedBridge Access Code: [email or web address](3/3, revised 3/23, revised 3/28)    Consulted and Agree with Plan of Care Patient             Patient will benefit from skilled therapeutic intervention in order to improve the following deficits and impairments:  Abnormal gait,Decreased activity tolerance,Decreased balance,Decreased coordination,Decreased endurance,Decreased knowledge of precautions,Decreased knowledge of use of DME,Decreased mobility,Decreased range of motion,Decreased safety awareness,Decreased strength,Difficulty walking,Increased fascial restricitons,Increased muscle spasms,Impaired perceived functional ability,Impaired flexibility,Improper body mechanics,Postural dysfunction,Pain  Visit  Diagnosis: Pain in right hip  Stiffness of right hip, not elsewhere classified  Muscle weakness (generalized)  Difficulty in walking, not elsewhere classified     Problem List Patient Active Problem List   Diagnosis Date Noted   Labral tear of right hip joint 09/18/2020   Shortness of breath on exertion 09/18/2020   Chronic pain syndrome 09/14/2020   Chronic dyspnea 06/01/2020   Pain of left calf 06/01/2020   Ankle weakness 04/02/2020   Chronic pain of both ankles 04/02/2020   Acute midline thoracic back pain 10/14/2019   Double vision 08/05/2019   Weakness 08/05/2019   Adjustment disorder with depressed mood 08/05/2019   Acute midline low back pain without sciatica 08/05/2019   Adult-onset [place]'s disease ([place] 08/03/2019   Vision loss, bilateral 07/25/2019   Myalgia 07/25/2019   Polyarthritis 07/25/2019   Multiple joint pain 07/20/2019   Pneumonia of both lungs due to infectious organism 07/20/2019   Abnormal liver function    Aseptic meningitis    Leukocytosis    Ac[place] failure with [place]emia ([place] 07/08/2019   FUO (fever of unknown origin) 07/07/2019   Headache 07/07/2019   Rash 07/07/2019   Sepsis ([place] 07/06/2019   Chronic heel pain, left 05/20/2018   Hypertriglyceridemia 06/30/2016   Ringworm 06/29/2016    SNewman NickelsSPT 12/27/2020, 1:57 PM  [facility] [place] [address]Phone: [phone]  Fax:  [phone] Name: SAthen RielMRN: [id number] of Birth: 8August 20, 1992    PHYSICAL THERAPY DISCHARGE SUMMARY  Visits from Start of Care: 6  Current functional level related  to goals / functional outcomes:   Refer to above clinical impression for status as of last visit on 12/27/2020. Patient was placed on hold for 30 days and has not needed to return to PT, therefore will proceed with discharge from PT for this episode.   Remaining deficits:   As  above.   Education / Equipment:   HEP   Patient agrees to discharge. Patient goals were partially met. Patient is being discharged due to  not returning during 30-day hold while surgery consult pending.[name]   Percival Spanish, PT, MPT 03/01/21, 10:28 AM  [facility] [address]  [place], [place], [address] Phone: [phone]   Fax:  [phone]

## 2020-12-27 NOTE — Patient Instructions (Signed)
  Access Code: [email or web address] URL: [email or web address] Date: 12/27/2020 Prepared by: Glenetta Hew  Exercises Standing ITB Stretch - 2-3 x daily - 7 x weekly - 3 reps - 30 sec hold Seated Hamstring Stretch - 2-3 x daily - 7 x weekly - 3 reps - 30 sec hold Seated Hip Flexor Stretch - 2-3 x daily - 7 x weekly - 3 reps - 30 sec hold Seated Hip Adduction Squeeze with Ball - 1 x daily - 7 x weekly - 2 sets - 10 reps - 5 sec hold Seated Isometric Hip Abduction with Resistance - 1 x daily - 7 x weekly - 2 sets - 10 reps - 3 sec hold Beginner Bridge - 1 x daily - 7 x weekly - 2 sets - 5 reps - 3 sec hold Supine March with Resistance Band - 1 x daily - 7 x weekly - 2 sets - 5 reps - 2-3 sec hold Hip Extension with Resistance Loop - 1 x daily - 7 x weekly - 2 sets - 10 reps - 2-3 sec hold Active Straight Leg Raise with Quad Set - 1 x daily - 3-4 x weekly - 2 sets - 10 reps - 3 hold Sidelying Hip Abduction - 1 x daily - 3-4 x weekly - 2 sets - 10 reps - 3 hold

## 2020-12-27 NOTE — Telephone Encounter (Signed)
Referral sent 

## 2021-01-08 DIAGNOSIS — M25551 Pain in right hip: Secondary | ICD-10-CM | POA: Diagnosis not present

## 2021-01-23 ENCOUNTER — Ambulatory Visit: Payer: Medicaid Other | Admitting: Sports Medicine

## 2021-01-23 ENCOUNTER — Other Ambulatory Visit: Payer: Self-pay

## 2021-01-23 DIAGNOSIS — M25551 Pain in right hip: Secondary | ICD-10-CM

## 2021-01-23 DIAGNOSIS — S73191S Other sprain of right hip, sequela: Secondary | ICD-10-CM | POA: Diagnosis not present

## 2021-01-23 MED ORDER — TRAMADOL HCL 50 MG PO TABS: 50.0000 mg | ORAL_TABLET | Freq: Three times a day (TID) | ORAL | 0 refills | Status: DC | PRN

## 2021-01-23 NOTE — Progress Notes (Signed)
    Procedures performed today:    None.  Independent interpretation of notes and tests performed by another provider:   None.  Brief History, Exam, Impression, and Recommendations:    Labral tear of right hip joint Raymond Owens was diagnosed ultimately with a very large hip labral tear, he saw Dr. Caswell Corwin at the hip center, they are discussing hip arthroscopy with labral repair and osteotomy. He is in a lot of pain so we will add some tramadol to hold him over in the meantime. He will also need clearance from his rheumatologist regarding perioperative Biologics. Return as needed. I can continue tramadol until his surgery. This is a chronic process with exacerbation and pharmacologic management.    ___________________________________________ Raymond Owens. Benjamin Stain, M.D., ABFM., CAQSM. Primary Care and Sports Medicine [facility]  Adjunct Instructor of Family Medicine  [employer]

## 2021-01-23 NOTE — Assessment & Plan Note (Addendum)
Raymond Owens was diagnosed ultimately with a very large hip labral tear, he saw Dr. Caswell Corwin at the hip center, they are discussing hip arthroscopy with labral repair and osteotomy. He is in a lot of pain so we will add some tramadol to hold him over in the meantime. He will also need clearance from his rheumatologist regarding perioperative Biologics. Return as needed. I can continue tramadol until his surgery. This is a chronic process with exacerbation and pharmacologic management.

## 2021-01-25 ENCOUNTER — Encounter: Payer: Self-pay | Admitting: Physician Assistant

## 2021-01-25 DIAGNOSIS — Z79899 Other long term (current) drug therapy: Secondary | ICD-10-CM

## 2021-01-25 DIAGNOSIS — M061 Adult-onset Still's disease: Secondary | ICD-10-CM

## 2021-01-28 NOTE — Telephone Encounter (Signed)
Ok to place order 

## 2021-01-29 NOTE — Telephone Encounter (Signed)
Ordered labs

## 2021-02-07 DIAGNOSIS — S73191S Other sprain of right hip, sequela: Secondary | ICD-10-CM

## 2021-02-11 ENCOUNTER — Telehealth: Payer: Self-pay

## 2021-02-11 NOTE — Telephone Encounter (Signed)
Referral was placed on Friday and faxed to specialist office today. Unable to reach [facility] via telephone; no VM.

## 2021-02-11 NOTE — Telephone Encounter (Signed)
Gerome Sam, [facility] Administrator, called requesting a new referral to Dr. Augusto Gamble. He stated that patient has been seen at their clinic and it has been determined that he needs to se Dr. Christell Constant and needs a new referral. He already has an appt scheduled and they need this referral this week.

## 2021-02-11 NOTE — Telephone Encounter (Signed)
Yes, I put that referral in last week.

## 2021-02-18 DIAGNOSIS — S73191D Other sprain of right hip, subsequent encounter: Secondary | ICD-10-CM | POA: Diagnosis not present

## 2021-02-18 DIAGNOSIS — M0820 Juvenile rheumatoid arthritis with systemic onset, unspecified site: Secondary | ICD-10-CM | POA: Diagnosis not present

## 2021-02-18 DIAGNOSIS — Z79899 Other long term (current) drug therapy: Secondary | ICD-10-CM | POA: Diagnosis not present

## 2021-02-18 DIAGNOSIS — M061 Adult-onset Still's disease: Secondary | ICD-10-CM | POA: Diagnosis not present

## 2021-02-19 ENCOUNTER — Encounter: Payer: Self-pay | Admitting: Physician Assistant

## 2021-02-19 NOTE — Progress Notes (Signed)
Sed rate and CRP are way down.   Do we need to fax labs or can [facility] see them, [name]?

## 2021-02-21 LAB — CBC WITH DIFFERENTIAL/PLATELET
Absolute Monocytes: 555 cells/uL (ref 200–950)
Basophils Absolute: 53 cells/uL (ref 0–200)
Basophils Relative: 0.7 %
Eosinophils Absolute: 68 cells/uL (ref 15–500)
Eosinophils Relative: 0.9 %
HCT: 45.9 % (ref 38.5–50.0)
Hemoglobin: 15.8 g/dL (ref 13.2–17.1)
Lymphs Abs: 1588 cells/uL (ref 850–3900)
MCH: 31.9 pg (ref 27.0–33.0)
MCHC: 34.4 g/dL (ref 32.0–36.0)
MCV: 92.7 fL (ref 80.0–100.0)
MPV: 11 fL (ref 7.5–12.5)
Monocytes Relative: 7.3 %
Neutro Abs: 5335 cells/uL (ref 1500–7800)
Neutrophils Relative %: 70.2 %
Platelets: 279 10*3/uL (ref 140–400)
RBC: 4.95 10*6/uL (ref 4.20–5.80)
RDW: 13.2 % (ref 11.0–15.0)
Total Lymphocyte: 20.9 %
WBC: 7.6 10*3/uL (ref 3.8–10.8)

## 2021-02-21 LAB — COMPLETE METABOLIC PANEL WITH GFR
AG Ratio: 1.8 (calc) (ref 1.0–2.5)
ALT: 53 U/L — ABNORMAL HIGH (ref 9–46)
AST: 28 U/L (ref 10–40)
Albumin: 4.6 g/dL (ref 3.6–5.1)
Alkaline phosphatase (APISO): 54 U/L (ref 36–130)
BUN: 14 mg/dL (ref 7–25)
CO2: 28 mmol/L (ref 20–32)
Calcium: 9.6 mg/dL (ref 8.6–10.3)
Chloride: 103 mmol/L (ref 98–110)
Creat: 0.79 mg/dL (ref 0.60–1.35)
GFR, Est African American: 142 mL/min/{1.73_m2} (ref >=60)
GFR, Est Non African American: 122 mL/min/{1.73_m2} (ref >=60)
Globulin: 2.6 g/dL (calc) (ref 1.9–3.7)
Glucose, Bld: 87 mg/dL (ref 65–99)
Potassium: 4.1 mmol/L (ref 3.5–5.3)
Sodium: 138 mmol/L (ref 135–146)
Total Bilirubin: 0.7 mg/dL (ref 0.2–1.2)
Total Protein: 7.2 g/dL (ref 6.1–8.1)

## 2021-02-21 LAB — C-REACTIVE PROTEIN: CRP: 0.6 mg/L (ref <8.0)

## 2021-02-21 LAB — METHOTREXATE LEVEL: Methotrexate: <0.2 umol/L

## 2021-02-21 LAB — SEDIMENTATION RATE: Sed Rate: 2 mm/h (ref 0–15)

## 2021-02-22 NOTE — Progress Notes (Signed)
Ok to send to [facility].

## 2021-02-26 ENCOUNTER — Other Ambulatory Visit: Payer: Self-pay | Admitting: Physician Assistant

## 2021-02-26 DIAGNOSIS — M061 Adult-onset Still's disease: Secondary | ICD-10-CM

## 2021-02-27 DIAGNOSIS — S73191S Other sprain of right hip, sequela: Secondary | ICD-10-CM

## 2021-03-02 ENCOUNTER — Encounter: Payer: Self-pay | Admitting: Physician Assistant

## 2021-03-04 ENCOUNTER — Other Ambulatory Visit: Payer: Self-pay

## 2021-03-04 DIAGNOSIS — S73191S Other sprain of right hip, sequela: Secondary | ICD-10-CM

## 2021-03-04 MED ORDER — FLUOXETINE HCL 20 MG PO CAPS: 20.0000 mg | ORAL_CAPSULE | Freq: Every day | ORAL | 3 refills | Status: DC

## 2021-03-05 ENCOUNTER — Other Ambulatory Visit: Payer: Self-pay | Admitting: Physician Assistant

## 2021-03-05 DIAGNOSIS — S73191S Other sprain of right hip, sequela: Secondary | ICD-10-CM

## 2021-03-05 MED ORDER — TRAMADOL HCL 50 MG PO TABS: 50.0000 mg | ORAL_TABLET | Freq: Three times a day (TID) | ORAL | 0 refills | Status: DC | PRN

## 2021-03-05 NOTE — Progress Notes (Signed)
..  PDMP reviewed during this encounter. Sent refill tramadol.

## 2021-03-08 ENCOUNTER — Ambulatory Visit: Payer: Medicaid Other | Admitting: Physician Assistant

## 2021-03-08 ENCOUNTER — Encounter: Payer: Self-pay | Admitting: Physician Assistant

## 2021-03-08 VITALS — BP 115/56 | HR 85 | Ht 71.0 in | Wt 238.0 lb

## 2021-03-08 DIAGNOSIS — M061 Adult-onset Still's disease: Secondary | ICD-10-CM

## 2021-03-08 DIAGNOSIS — S73191S Other sprain of right hip, sequela: Secondary | ICD-10-CM | POA: Diagnosis not present

## 2021-03-08 DIAGNOSIS — T300 Burn of unspecified body region, unspecified degree: Secondary | ICD-10-CM | POA: Diagnosis not present

## 2021-03-08 DIAGNOSIS — T148XXA Other injury of unspecified body region, initial encounter: Secondary | ICD-10-CM | POA: Diagnosis not present

## 2021-03-08 DIAGNOSIS — Z23 Encounter for immunization: Secondary | ICD-10-CM | POA: Diagnosis not present

## 2021-03-08 MED ORDER — MOXIFLOXACIN HCL 400 MG PO TABS: 400.0000 mg | ORAL_TABLET | Freq: Every day | ORAL | 0 refills | Status: AC

## 2021-03-08 MED ORDER — METHOTREXATE 2.5 MG PO TABS: 20.0000 mg | ORAL_TABLET | ORAL | 0 refills | Status: DC

## 2021-03-08 MED ORDER — SILVER SULFADIAZINE 1 % EX CREA: 1.0000 "application " | TOPICAL_CREAM | Freq: Every day | CUTANEOUS | 0 refills | Status: DC

## 2021-03-08 MED ORDER — TRAMADOL HCL 50 MG PO TABS: 50.0000 mg | ORAL_TABLET | Freq: Two times a day (BID) | ORAL | 0 refills | Status: DC | PRN

## 2021-03-08 NOTE — Patient Instructions (Signed)
Given Tdap today.  Start antibiotic for puncture wound.  Increased tramadol to 2 tablets twice a day.  Silvadine cream for burn.   Puncture Wound A puncture wound is an injury that is caused by a sharp, thin object that goes through your skin. A puncture wound usually does not leave a large opening in your skin, so it may not bleed a lot. However, when you get a puncture wound, dirt or other materials (foreign bodies) can be forced into your wound and can break off inside. This increases the chance of infection, such as tetanus. There are many sharp, pointed objects that can cause puncture wounds, including teeth, nails, splinters of glass,fishhooks, and needles. Treatment may include the following steps: Washing out the wound with a germ-free (sterile) salt-water solution. Having surgery to open the wound and remove materials from it. Closing the wound with stitches (sutures). Covering the wound with antibiotic ointment and a bandage (dressing). Depending on what caused the injury, you may also need a tetanus shot or arabies shot. Follow these instructions at home: Medicines Take or apply over-the-counter and prescription medicines only as told by your doctor. If you were prescribed an antibiotic medicine, take or apply it as told by your doctor. Do not stop using the antibiotic even if your condition starts to get better. Bathing Keep the bandage dry as told by your doctor. Do not take baths, swim, or use a hot tub until your doctor approves. Ask your doctor if you may take showers. You may only be allowed to take sponge baths. Wound care  There are many ways to close and cover a wound. For example, a wound can be closed with stitches, skin glue, or skin tape (adhesive strips). Follow instructions from your doctor about how to take care of your wound. Make sure you: Wash your hands with soap and water before and after you change your bandage. If you cannot use soap and water, use hand  sanitizer. Change your bandage as told by your doctor. Leave stitches, skin glue, or skin tape strips in place. They may need to stay in place for 2 weeks or longer. If tape strips get loose and curl up, you may trim the loose edges. Do not remove tape strips completely unless your doctor says it is okay. Clean the wound as told by your doctor. Do not scratch or pick at the wound. Check your wound every day for signs of infection. Watch for: Redness, swelling, or pain. Fluid or blood. Warmth. Pus or a bad smell.  General instructions Raise (elevate) the injured area above the level of your heart while you are sitting or lying down. If your puncture wound is in your foot, ask your doctor if you need to avoid putting weight on your foot and for how long. Use crutches as told by your doctor. Keep all follow-up visits as told by your doctor. This is important. Contact a doctor if: You got a tetanus shot and you have any of these problems at the injection site: Swelling. Very bad pain. Redness. Bleeding. You have a fever. Your stitches come out. You notice a bad smell coming from your wound or your bandage. You notice something coming out of the wound, such as Beecher or glass. Medicine does not help your pain. You have more redness, swelling, or pain at the site of your wound. You have fluid, blood, or pus coming from your wound. You notice a change in the color of your skin near your wound.  You need to change the bandage often because fluid, blood, or pus is coming from the wound. You start to have a new rash. You start to lose feeling (have numbness) around the wound. You have warmth around your wound. Get help right away if: You have very bad swelling around the wound. Your pain quickly gets worse and is very bad. You start to get painful skin lumps. You have a red streak going away from your wound. The wound is on your hand or foot and you: Cannot move a finger or toe like  normal. Notice that your fingers or toes look pale or blue. Summary A puncture wound is an injury that is caused by a sharp, thin object that goes through your skin. Treatment may include washing out the wound, having surgery to open the wound to clean it, closing the wound, and covering the wound with a bandage. Follow instructions from your doctor about how to take care of your wound. Contact your doctor if you have more redness, swelling, or pain at the site of your wound. Keep all follow-up visits as told by your doctor. This is important. This information is not intended to replace advice given to you by your health care provider. Make sure you discuss any questions you have with your healthcare provider. Document Revised: 12/20/2019 Document Reviewed: 03/18/2018 Elsevier Patient Education  2022 [employer].

## 2021-03-15 ENCOUNTER — Ambulatory Visit: Payer: Medicaid Other | Admitting: Physician Assistant

## 2021-03-18 DIAGNOSIS — Z79899 Other long term (current) drug therapy: Secondary | ICD-10-CM | POA: Diagnosis not present

## 2021-03-18 DIAGNOSIS — M545 Low back pain, unspecified: Secondary | ICD-10-CM | POA: Diagnosis not present

## 2021-03-18 DIAGNOSIS — G8929 Other chronic pain: Secondary | ICD-10-CM | POA: Diagnosis not present

## 2021-03-18 DIAGNOSIS — M0820 Juvenile rheumatoid arthritis with systemic onset, unspecified site: Secondary | ICD-10-CM | POA: Diagnosis not present

## 2021-03-18 DIAGNOSIS — M0828 Juvenile rheumatoid arthritis with systemic onset, vertebrae: Secondary | ICD-10-CM | POA: Diagnosis not present

## 2021-03-18 DIAGNOSIS — R5381 Other malaise: Secondary | ICD-10-CM | POA: Diagnosis not present

## 2021-03-18 DIAGNOSIS — R5382 Chronic fatigue, unspecified: Secondary | ICD-10-CM | POA: Diagnosis not present

## 2021-03-18 NOTE — Progress Notes (Signed)
Subjective:    Patient ID: Raymond Owens, male    DOB: Dec 01, 1990, 30 y.o.   MRN: [id number]  HPI Pt is a 30 yo male with adults stills disease who presents to the clinic after stepping on a nail yesterday into his right foot. He comes in due to being immunocompromised and on steroids to make sure no infection develops. Nail was very rusty and went through shoe. Able to bear weight on foot.   No fever, chills.   He also has a superficial burn on left lower anterior leg that he would like looked at.   Pain has increased in right hip due to labral tear. He is working with ortho on what type of surgery to have. Tramadol 1 tablet bid is not helping. Would like to discuss increase.   .. 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   Double vision 08/05/2019   Weakness 08/05/2019   Adjustment disorder with depressed mood 08/05/2019   Acute midline low back pain without sciatica 08/05/2019   Acute midline thoracic back pain 10/14/2019   Ankle weakness 04/02/2020   Chronic pain of both ankles 04/02/2020   Chronic dyspnea 06/01/2020   Pain of left calf 06/01/2020   Chronic pain syndrome 09/14/2020   Labral tear of right hip joint 09/18/2020   Shortness of breath on exertion 09/18/2020   Resolved Ambulatory Problems    Diagnosis Date Noted   Bacterial meningitis 07/08/2019   Past Medical History:  Diagnosis Date    Hyperlipidemia    Still's disease (HCC)        Review of Systems See HPI.     Objective:   Physical Exam Vitals reviewed.  Constitutional:      Appearance: Normal appearance.  HENT:     Head: Normocephalic.  Cardiovascular:     Rate and Rhythm: Normal rate and regular rhythm.  Pulmonary:     Effort: Pulmonary effort is normal.     Breath sounds: Normal breath sounds.  Skin:    Comments: Puncture wound to the ball on right foot. No warmth, swelling, discharge. A little red to sight.   4cm by 3 cm superficial 1st degree burn of left lower anterior leg.   Neurological:     General: No focal deficit present.     Mental Status: He is alert and oriented to person, place, and time.  Psychiatric:        Mood and Affect: Mood normal.          Assessment & Plan:  [name] KitchenMarland KitchenDontre was seen today for follow-up.  Diagnoses and all orders for this visit:  Puncture wound -     Tdap vaccine greater than or equal to 7yo IM -     moxifloxacin (AVELOX) 400 MG tablet; Take 1 tablet (400 mg total) by mouth daily for 7 days.  Burn -     silver sulfADIAZINE (SILVADENE) 1 % cream; Apply  1 application topically daily.  Tear of right acetabular labrum, sequela -     traMADol (ULTRAM) 50 MG tablet; Take 1-2 tablets (50-100 mg total) by mouth every 12 (twelve) hours as needed for moderate pain. Maximum 6 tabs per day.  Adult-onset Still's disease (HCC) -     methotrexate (RHEUMATREX) 2.5 MG tablet; Take 8 tablets (20 mg total) by mouth once a week.   Tdap right at 6 years. Due to new injury and immunocompromised status up date Tdap today.  Bactrim has interaction with methotrexate.  Allergy to PCN.  Sent avelox and consulted pharmacist Raymond Owens to prevent infection from puncture wound.  Discussed how to keep wound clean.   Silvadine given for burn for next 5 days.   [name] KitchenMarland KitchenPDMP reviewed during this encounter. Refilled tramadol. Increased to 2 tablets twice a day.  Updated pain contract.   Pt is being managed by ortho and considering surgery.

## 2021-04-10 DIAGNOSIS — Z7952 Long term (current) use of systemic steroids: Secondary | ICD-10-CM | POA: Diagnosis not present

## 2021-04-10 DIAGNOSIS — M79672 Pain in left foot: Secondary | ICD-10-CM | POA: Diagnosis not present

## 2021-04-23 ENCOUNTER — Encounter: Payer: Self-pay | Admitting: Physician Assistant

## 2021-05-28 DIAGNOSIS — M25651 Stiffness of right hip, not elsewhere classified: Secondary | ICD-10-CM | POA: Diagnosis not present

## 2021-05-28 DIAGNOSIS — M25551 Pain in right hip: Secondary | ICD-10-CM | POA: Diagnosis not present

## 2021-05-28 DIAGNOSIS — R262 Difficulty in walking, not elsewhere classified: Secondary | ICD-10-CM | POA: Diagnosis not present

## 2021-05-28 DIAGNOSIS — Z7409 Other reduced mobility: Secondary | ICD-10-CM | POA: Diagnosis not present

## 2021-05-28 DIAGNOSIS — Z789 Other specified health status: Secondary | ICD-10-CM | POA: Diagnosis not present

## 2021-05-28 DIAGNOSIS — S73191S Other sprain of right hip, sequela: Secondary | ICD-10-CM | POA: Diagnosis not present

## 2021-05-28 DIAGNOSIS — R29898 Other symptoms and signs involving the musculoskeletal system: Secondary | ICD-10-CM | POA: Diagnosis not present

## 2021-05-29 DIAGNOSIS — M24851 Other specific joint derangements of right hip, not elsewhere classified: Secondary | ICD-10-CM | POA: Diagnosis not present

## 2021-05-29 DIAGNOSIS — M25551 Pain in right hip: Secondary | ICD-10-CM | POA: Diagnosis not present

## 2021-05-29 DIAGNOSIS — M94251 Chondromalacia, right hip: Secondary | ICD-10-CM | POA: Diagnosis not present

## 2021-05-29 DIAGNOSIS — M65851 Other synovitis and tenosynovitis, right thigh: Secondary | ICD-10-CM | POA: Diagnosis not present

## 2021-06-04 DIAGNOSIS — M25651 Stiffness of right hip, not elsewhere classified: Secondary | ICD-10-CM | POA: Diagnosis not present

## 2021-06-04 DIAGNOSIS — Z789 Other specified health status: Secondary | ICD-10-CM | POA: Diagnosis not present

## 2021-06-04 DIAGNOSIS — M25551 Pain in right hip: Secondary | ICD-10-CM | POA: Diagnosis not present

## 2021-06-04 DIAGNOSIS — Z7409 Other reduced mobility: Secondary | ICD-10-CM | POA: Diagnosis not present

## 2021-06-04 DIAGNOSIS — S73191S Other sprain of right hip, sequela: Secondary | ICD-10-CM | POA: Diagnosis not present

## 2021-06-04 DIAGNOSIS — R29898 Other symptoms and signs involving the musculoskeletal system: Secondary | ICD-10-CM | POA: Diagnosis not present

## 2021-06-04 DIAGNOSIS — R262 Difficulty in walking, not elsewhere classified: Secondary | ICD-10-CM | POA: Diagnosis not present

## 2021-06-07 DIAGNOSIS — Z7409 Other reduced mobility: Secondary | ICD-10-CM | POA: Diagnosis not present

## 2021-06-07 DIAGNOSIS — Z789 Other specified health status: Secondary | ICD-10-CM | POA: Diagnosis not present

## 2021-06-07 DIAGNOSIS — R29898 Other symptoms and signs involving the musculoskeletal system: Secondary | ICD-10-CM | POA: Diagnosis not present

## 2021-06-07 DIAGNOSIS — R262 Difficulty in walking, not elsewhere classified: Secondary | ICD-10-CM | POA: Diagnosis not present

## 2021-06-07 DIAGNOSIS — M25651 Stiffness of right hip, not elsewhere classified: Secondary | ICD-10-CM | POA: Diagnosis not present

## 2021-06-07 DIAGNOSIS — M25551 Pain in right hip: Secondary | ICD-10-CM | POA: Diagnosis not present

## 2021-06-07 DIAGNOSIS — S73191S Other sprain of right hip, sequela: Secondary | ICD-10-CM | POA: Diagnosis not present

## 2021-06-11 DIAGNOSIS — R29898 Other symptoms and signs involving the musculoskeletal system: Secondary | ICD-10-CM | POA: Diagnosis not present

## 2021-06-11 DIAGNOSIS — Z4789 Encounter for other orthopedic aftercare: Secondary | ICD-10-CM | POA: Diagnosis not present

## 2021-06-11 DIAGNOSIS — M25551 Pain in right hip: Secondary | ICD-10-CM | POA: Diagnosis not present

## 2021-06-11 DIAGNOSIS — G8929 Other chronic pain: Secondary | ICD-10-CM | POA: Diagnosis not present

## 2021-06-17 DIAGNOSIS — G8929 Other chronic pain: Secondary | ICD-10-CM | POA: Diagnosis not present

## 2021-06-17 DIAGNOSIS — M25551 Pain in right hip: Secondary | ICD-10-CM | POA: Diagnosis not present

## 2021-06-24 DIAGNOSIS — Z7952 Long term (current) use of systemic steroids: Secondary | ICD-10-CM | POA: Diagnosis not present

## 2021-06-24 DIAGNOSIS — Z79899 Other long term (current) drug therapy: Secondary | ICD-10-CM | POA: Diagnosis not present

## 2021-06-24 DIAGNOSIS — M0820 Juvenile rheumatoid arthritis with systemic onset, unspecified site: Secondary | ICD-10-CM | POA: Diagnosis not present

## 2021-06-24 DIAGNOSIS — R5381 Other malaise: Secondary | ICD-10-CM | POA: Diagnosis not present

## 2021-06-24 DIAGNOSIS — M061 Adult-onset Still's disease: Secondary | ICD-10-CM | POA: Diagnosis not present

## 2021-06-24 DIAGNOSIS — R5382 Chronic fatigue, unspecified: Secondary | ICD-10-CM | POA: Diagnosis not present

## 2021-06-25 DIAGNOSIS — Z789 Other specified health status: Secondary | ICD-10-CM | POA: Diagnosis not present

## 2021-06-25 DIAGNOSIS — R262 Difficulty in walking, not elsewhere classified: Secondary | ICD-10-CM | POA: Diagnosis not present

## 2021-06-25 DIAGNOSIS — R29898 Other symptoms and signs involving the musculoskeletal system: Secondary | ICD-10-CM | POA: Diagnosis not present

## 2021-06-25 DIAGNOSIS — G8929 Other chronic pain: Secondary | ICD-10-CM | POA: Diagnosis not present

## 2021-06-25 DIAGNOSIS — M25651 Stiffness of right hip, not elsewhere classified: Secondary | ICD-10-CM | POA: Diagnosis not present

## 2021-06-25 DIAGNOSIS — Z7409 Other reduced mobility: Secondary | ICD-10-CM | POA: Diagnosis not present

## 2021-06-25 DIAGNOSIS — M25551 Pain in right hip: Secondary | ICD-10-CM | POA: Diagnosis not present

## 2021-07-01 DIAGNOSIS — M25551 Pain in right hip: Secondary | ICD-10-CM | POA: Diagnosis not present

## 2021-07-01 DIAGNOSIS — Z7409 Other reduced mobility: Secondary | ICD-10-CM | POA: Diagnosis not present

## 2021-07-01 DIAGNOSIS — M25651 Stiffness of right hip, not elsewhere classified: Secondary | ICD-10-CM | POA: Diagnosis not present

## 2021-07-01 DIAGNOSIS — G8929 Other chronic pain: Secondary | ICD-10-CM | POA: Diagnosis not present

## 2021-07-01 DIAGNOSIS — R29898 Other symptoms and signs involving the musculoskeletal system: Secondary | ICD-10-CM | POA: Diagnosis not present

## 2021-07-01 DIAGNOSIS — R262 Difficulty in walking, not elsewhere classified: Secondary | ICD-10-CM | POA: Diagnosis not present

## 2021-07-01 DIAGNOSIS — Z789 Other specified health status: Secondary | ICD-10-CM | POA: Diagnosis not present

## 2021-07-03 ENCOUNTER — Ambulatory Visit: Payer: Medicaid Other | Admitting: Physician Assistant

## 2021-07-03 ENCOUNTER — Encounter: Payer: Self-pay | Admitting: Physician Assistant

## 2021-07-03 VITALS — BP 119/73 | HR 90 | Ht 71.0 in | Wt 236.0 lb

## 2021-07-03 DIAGNOSIS — M25651 Stiffness of right hip, not elsewhere classified: Secondary | ICD-10-CM | POA: Diagnosis not present

## 2021-07-03 DIAGNOSIS — Z789 Other specified health status: Secondary | ICD-10-CM | POA: Diagnosis not present

## 2021-07-03 DIAGNOSIS — Z79899 Other long term (current) drug therapy: Secondary | ICD-10-CM

## 2021-07-03 DIAGNOSIS — M25551 Pain in right hip: Secondary | ICD-10-CM | POA: Diagnosis not present

## 2021-07-03 DIAGNOSIS — G8929 Other chronic pain: Secondary | ICD-10-CM | POA: Diagnosis not present

## 2021-07-03 DIAGNOSIS — Z7409 Other reduced mobility: Secondary | ICD-10-CM | POA: Diagnosis not present

## 2021-07-03 DIAGNOSIS — Z4789 Encounter for other orthopedic aftercare: Secondary | ICD-10-CM | POA: Diagnosis not present

## 2021-07-03 DIAGNOSIS — M061 Adult-onset Still's disease: Secondary | ICD-10-CM | POA: Diagnosis not present

## 2021-07-03 DIAGNOSIS — Z13820 Encounter for screening for osteoporosis: Secondary | ICD-10-CM | POA: Diagnosis not present

## 2021-07-03 DIAGNOSIS — R29898 Other symptoms and signs involving the musculoskeletal system: Secondary | ICD-10-CM | POA: Diagnosis not present

## 2021-07-03 DIAGNOSIS — R262 Difficulty in walking, not elsewhere classified: Secondary | ICD-10-CM | POA: Diagnosis not present

## 2021-07-03 DIAGNOSIS — Z23 Encounter for immunization: Secondary | ICD-10-CM

## 2021-07-03 DIAGNOSIS — S73191S Other sprain of right hip, sequela: Secondary | ICD-10-CM | POA: Diagnosis not present

## 2021-07-03 MED ORDER — ILARIS 150 MG/ML ~~LOC~~ SOLN: 300.0000 mg | SUBCUTANEOUS | 0 refills | Status: DC

## 2021-07-03 NOTE — Patient Instructions (Signed)
Will get labs faxed to rheumatology.  Will order bone density.

## 2021-07-03 NOTE — Progress Notes (Signed)
Subjective:    Patient ID: Raymond Owens, male    DOB: [birth year], 30 y.o.   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   He saw rheumatology on 10/31 and prednisone was decreased and to stop after 06/2021. At beginning of the year he will decrease his methotrexate. He will continue canakinumab and folic acid. He is doing really well. Pain is much better. He is still pretty fatigued during the day especially on wednesdays when he takes methotrexate. Right hip surgery went well on 05/29/2021 and in PT twice a week. He was told to triple is calcium intake and get bone density. Overall he feels "ok".  .. Active Ambulatory Problems    Diagnosis Date Noted   Ringworm 06/29/2016   Hypertriglyceridemia 06/30/2016   Chronic heel pain, left 05/20/2018   Sepsis ([place]) 07/06/2019   FUO (fever of unknown origin) 07/07/2019   Headache 07/07/2019   Rash 07/07/2019   Acute respiratory failure with hypoxemia ([place]) 07/08/2019   Abnormal liver function    Aseptic meningitis    Leukocytosis    Multiple joint pain 07/20/2019   Pneumonia of both lungs due to infectious organism 07/20/2019   Vision loss, bilateral 07/25/2019   Myalgia 07/25/2019   Polyarthritis 07/25/2019   Adult-onset Still's disease ([place]) 08/03/2019   Double vision 08/05/2019   Weakness 08/05/2019   Adjustment disorder with depressed mood 08/05/2019   Acute midline low back pain without sciatica 08/05/2019   Acute midline thoracic back pain 10/14/2019   Ankle weakness 04/02/2020   Chronic pain of both ankles 04/02/2020   Chronic dyspnea 06/01/2020   Pain of left calf 06/01/2020   Chronic pain syndrome 09/14/2020   Labral tear of right hip joint 09/18/2020   Shortness of breath on exertion 09/18/2020   Resolved Ambulatory Problems    Diagnosis Date Noted   Bacterial meningitis 07/08/2019   Past Medical History:   Diagnosis Date   Hyperlipidemia    Still's disease ([place])       Review of Systems See HPI.     Objective:   Physical Exam Vitals reviewed.  Constitutional:      Appearance: Normal appearance. He is obese.  HENT:     Head: Normocephalic.  Cardiovascular:     Rate and Rhythm: Normal rate and regular rhythm.  Pulmonary:     Effort: Pulmonary effort is normal.  Neurological:     General: No focal deficit present.     Mental Status: He is alert and oriented to person, place, and time.     Gait: Gait normal.  Psychiatric:        Mood and Affect: Mood normal.          Assessment & Plan:  Raymond Owens KitchenMarland KitchenLeobardo was seen today for follow-up.  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[place]) -     Canakinumab (ILARIS) 150 MG/ML SOLN; Inject 300 mg into the skin every 30 (thirty) days. -     CBC with Differential/Platelet -     Hepatic function panel -     Creatinine -     Sed Rate (ESR) -     C-reactive protein  Osteoporosis screening -     DG Bone Density; Future  Flu vaccine need -     Flu Vaccine QUAD 39moIM (Fluarix, Fluzone & Alfiuria Quad PF)  High risk medications (  not anticoagulants) long-term use -     CBC with Differential/Platelet -     Hepatic function panel -     Creatinine -     Sed Rate (ESR) -     C-reactive protein  Labs from [facility] ordered today and will fax back to them.  Bone density ordered due to high doses of prednisone as risk factor for early osteoporosis.  Make sure taking vitamin D and calcium.  Flu shot given today.

## 2021-07-04 LAB — CBC WITH DIFFERENTIAL/PLATELET
Absolute Monocytes: 707 cells/uL (ref 200–950)
Basophils Absolute: 28 cells/uL (ref 0–200)
Basophils Relative: 0.4 %
Eosinophils Absolute: 238 cells/uL (ref 15–500)
Eosinophils Relative: 3.4 %
HCT: 45.3 % (ref 38.5–50.0)
Hemoglobin: 15.7 g/dL (ref 13.2–17.1)
Lymphs Abs: 2093 cells/uL (ref 850–3900)
MCH: 32 pg (ref 27.0–33.0)
MCHC: 34.7 g/dL (ref 32.0–36.0)
MCV: 92.4 fL (ref 80.0–100.0)
MPV: 11.1 fL (ref 7.5–12.5)
Monocytes Relative: 10.1 %
Neutro Abs: 3934 cells/uL (ref 1500–7800)
Neutrophils Relative %: 56.2 %
Platelets: 274 10*3/uL (ref 140–400)
RBC: 4.9 10*6/uL (ref 4.20–5.80)
RDW: 13.1 % (ref 11.0–15.0)
Total Lymphocyte: 29.9 %
WBC: 7 10*3/uL (ref 3.8–10.8)

## 2021-07-04 LAB — HEPATIC FUNCTION PANEL
AG Ratio: 2 (calc) (ref 1.0–2.5)
ALT: 43 U/L (ref 9–46)
AST: 24 U/L (ref 10–40)
Albumin: 4.7 g/dL (ref 3.6–5.1)
Alkaline phosphatase (APISO): 53 U/L (ref 36–130)
Bilirubin, Direct: 0.1 mg/dL (ref 0.0–0.2)
Globulin: 2.3 g/dL (calc) (ref 1.9–3.7)
Indirect Bilirubin: 0.4 mg/dL (calc) (ref 0.2–1.2)
Total Bilirubin: 0.5 mg/dL (ref 0.2–1.2)
Total Protein: 7 g/dL (ref 6.1–8.1)

## 2021-07-04 LAB — C-REACTIVE PROTEIN: CRP: 0.5 mg/L (ref <8.0)

## 2021-07-04 LAB — CREATININE, SERUM: Creat: 0.91 mg/dL (ref 0.60–1.24)

## 2021-07-04 LAB — SEDIMENTATION RATE: Sed Rate: 2 mm/h (ref 0–15)

## 2021-07-04 NOTE — Progress Notes (Signed)
Labs look really good. Inflammation so low. When all labs result will need to be faxed to [facility] rheumatology.

## 2021-07-04 NOTE — Progress Notes (Signed)
Please fax to [facility] Rheumatology.

## 2021-07-05 ENCOUNTER — Other Ambulatory Visit: Payer: Self-pay

## 2021-07-05 ENCOUNTER — Ambulatory Visit (INDEPENDENT_AMBULATORY_CARE_PROVIDER_SITE_OTHER): Payer: Medicaid Other

## 2021-07-05 DIAGNOSIS — Z13820 Encounter for screening for osteoporosis: Secondary | ICD-10-CM

## 2021-07-08 NOTE — Progress Notes (Signed)
GREAT news bone density is within the expected range for age. Continue vitamin D and calcium. I would suggest repeat in 2 years.

## 2021-07-09 DIAGNOSIS — R262 Difficulty in walking, not elsewhere classified: Secondary | ICD-10-CM | POA: Diagnosis not present

## 2021-07-09 DIAGNOSIS — M25551 Pain in right hip: Secondary | ICD-10-CM | POA: Diagnosis not present

## 2021-07-09 DIAGNOSIS — M25651 Stiffness of right hip, not elsewhere classified: Secondary | ICD-10-CM | POA: Diagnosis not present

## 2021-07-09 DIAGNOSIS — R29898 Other symptoms and signs involving the musculoskeletal system: Secondary | ICD-10-CM | POA: Diagnosis not present

## 2021-07-09 DIAGNOSIS — Z789 Other specified health status: Secondary | ICD-10-CM | POA: Diagnosis not present

## 2021-07-09 DIAGNOSIS — Z7409 Other reduced mobility: Secondary | ICD-10-CM | POA: Diagnosis not present

## 2021-07-09 DIAGNOSIS — G8929 Other chronic pain: Secondary | ICD-10-CM | POA: Diagnosis not present

## 2021-07-11 DIAGNOSIS — M25651 Stiffness of right hip, not elsewhere classified: Secondary | ICD-10-CM | POA: Diagnosis not present

## 2021-07-11 DIAGNOSIS — G8929 Other chronic pain: Secondary | ICD-10-CM | POA: Diagnosis not present

## 2021-07-11 DIAGNOSIS — Z7409 Other reduced mobility: Secondary | ICD-10-CM | POA: Diagnosis not present

## 2021-07-11 DIAGNOSIS — Z789 Other specified health status: Secondary | ICD-10-CM | POA: Diagnosis not present

## 2021-07-11 DIAGNOSIS — M25551 Pain in right hip: Secondary | ICD-10-CM | POA: Diagnosis not present

## 2021-07-11 DIAGNOSIS — R29898 Other symptoms and signs involving the musculoskeletal system: Secondary | ICD-10-CM | POA: Diagnosis not present

## 2021-07-11 DIAGNOSIS — R262 Difficulty in walking, not elsewhere classified: Secondary | ICD-10-CM | POA: Diagnosis not present

## 2021-07-15 DIAGNOSIS — R262 Difficulty in walking, not elsewhere classified: Secondary | ICD-10-CM | POA: Diagnosis not present

## 2021-07-15 DIAGNOSIS — M25551 Pain in right hip: Secondary | ICD-10-CM | POA: Diagnosis not present

## 2021-07-15 DIAGNOSIS — M25651 Stiffness of right hip, not elsewhere classified: Secondary | ICD-10-CM | POA: Diagnosis not present

## 2021-07-15 DIAGNOSIS — Z7409 Other reduced mobility: Secondary | ICD-10-CM | POA: Diagnosis not present

## 2021-07-15 DIAGNOSIS — Z4789 Encounter for other orthopedic aftercare: Secondary | ICD-10-CM | POA: Diagnosis not present

## 2021-07-15 DIAGNOSIS — R29898 Other symptoms and signs involving the musculoskeletal system: Secondary | ICD-10-CM | POA: Diagnosis not present

## 2021-07-15 DIAGNOSIS — Z789 Other specified health status: Secondary | ICD-10-CM | POA: Diagnosis not present

## 2021-07-15 DIAGNOSIS — G8929 Other chronic pain: Secondary | ICD-10-CM | POA: Diagnosis not present

## 2021-07-16 ENCOUNTER — Other Ambulatory Visit: Payer: Self-pay | Admitting: Physician Assistant

## 2021-07-16 DIAGNOSIS — M061 Adult-onset Still's disease: Secondary | ICD-10-CM

## 2021-07-17 DIAGNOSIS — G8929 Other chronic pain: Secondary | ICD-10-CM | POA: Diagnosis not present

## 2021-07-17 DIAGNOSIS — Z789 Other specified health status: Secondary | ICD-10-CM | POA: Diagnosis not present

## 2021-07-17 DIAGNOSIS — M25551 Pain in right hip: Secondary | ICD-10-CM | POA: Diagnosis not present

## 2021-07-17 DIAGNOSIS — R29898 Other symptoms and signs involving the musculoskeletal system: Secondary | ICD-10-CM | POA: Diagnosis not present

## 2021-07-17 DIAGNOSIS — M25651 Stiffness of right hip, not elsewhere classified: Secondary | ICD-10-CM | POA: Diagnosis not present

## 2021-07-17 DIAGNOSIS — Z7409 Other reduced mobility: Secondary | ICD-10-CM | POA: Diagnosis not present

## 2021-07-17 DIAGNOSIS — R262 Difficulty in walking, not elsewhere classified: Secondary | ICD-10-CM | POA: Diagnosis not present

## 2021-07-22 DIAGNOSIS — R262 Difficulty in walking, not elsewhere classified: Secondary | ICD-10-CM | POA: Diagnosis not present

## 2021-07-22 DIAGNOSIS — Z4789 Encounter for other orthopedic aftercare: Secondary | ICD-10-CM | POA: Diagnosis not present

## 2021-07-22 DIAGNOSIS — Z7409 Other reduced mobility: Secondary | ICD-10-CM | POA: Diagnosis not present

## 2021-07-22 DIAGNOSIS — M25651 Stiffness of right hip, not elsewhere classified: Secondary | ICD-10-CM | POA: Diagnosis not present

## 2021-07-22 DIAGNOSIS — G8929 Other chronic pain: Secondary | ICD-10-CM | POA: Diagnosis not present

## 2021-07-22 DIAGNOSIS — S73191S Other sprain of right hip, sequela: Secondary | ICD-10-CM | POA: Diagnosis not present

## 2021-07-22 DIAGNOSIS — R29898 Other symptoms and signs involving the musculoskeletal system: Secondary | ICD-10-CM | POA: Diagnosis not present

## 2021-07-22 DIAGNOSIS — Z789 Other specified health status: Secondary | ICD-10-CM | POA: Diagnosis not present

## 2021-07-22 DIAGNOSIS — M25551 Pain in right hip: Secondary | ICD-10-CM | POA: Diagnosis not present

## 2021-07-23 ENCOUNTER — Encounter: Payer: Self-pay | Admitting: Physician Assistant

## 2021-07-23 NOTE — Telephone Encounter (Signed)
Miss Raymond Owens has scheduled patient for a visit. AM

## 2021-07-24 DIAGNOSIS — M25551 Pain in right hip: Secondary | ICD-10-CM | POA: Diagnosis not present

## 2021-07-24 DIAGNOSIS — R262 Difficulty in walking, not elsewhere classified: Secondary | ICD-10-CM | POA: Diagnosis not present

## 2021-07-24 DIAGNOSIS — G8929 Other chronic pain: Secondary | ICD-10-CM | POA: Diagnosis not present

## 2021-07-24 DIAGNOSIS — Z789 Other specified health status: Secondary | ICD-10-CM | POA: Diagnosis not present

## 2021-07-24 DIAGNOSIS — Z7409 Other reduced mobility: Secondary | ICD-10-CM | POA: Diagnosis not present

## 2021-07-24 DIAGNOSIS — S73191S Other sprain of right hip, sequela: Secondary | ICD-10-CM | POA: Diagnosis not present

## 2021-07-24 DIAGNOSIS — Z4789 Encounter for other orthopedic aftercare: Secondary | ICD-10-CM | POA: Diagnosis not present

## 2021-07-24 DIAGNOSIS — R29898 Other symptoms and signs involving the musculoskeletal system: Secondary | ICD-10-CM | POA: Diagnosis not present

## 2021-07-24 DIAGNOSIS — M25651 Stiffness of right hip, not elsewhere classified: Secondary | ICD-10-CM | POA: Diagnosis not present

## 2021-07-25 ENCOUNTER — Ambulatory Visit: Payer: Medicaid Other | Admitting: Family Medicine

## 2021-07-29 DIAGNOSIS — R262 Difficulty in walking, not elsewhere classified: Secondary | ICD-10-CM | POA: Diagnosis not present

## 2021-07-29 DIAGNOSIS — G8929 Other chronic pain: Secondary | ICD-10-CM | POA: Diagnosis not present

## 2021-07-29 DIAGNOSIS — Z4789 Encounter for other orthopedic aftercare: Secondary | ICD-10-CM | POA: Diagnosis not present

## 2021-07-29 DIAGNOSIS — Z789 Other specified health status: Secondary | ICD-10-CM | POA: Diagnosis not present

## 2021-07-29 DIAGNOSIS — M25651 Stiffness of right hip, not elsewhere classified: Secondary | ICD-10-CM | POA: Diagnosis not present

## 2021-07-29 DIAGNOSIS — R29898 Other symptoms and signs involving the musculoskeletal system: Secondary | ICD-10-CM | POA: Diagnosis not present

## 2021-07-29 DIAGNOSIS — Z7409 Other reduced mobility: Secondary | ICD-10-CM | POA: Diagnosis not present

## 2021-07-29 DIAGNOSIS — S73191S Other sprain of right hip, sequela: Secondary | ICD-10-CM | POA: Diagnosis not present

## 2021-07-29 DIAGNOSIS — M25551 Pain in right hip: Secondary | ICD-10-CM | POA: Diagnosis not present

## 2021-07-31 DIAGNOSIS — M25651 Stiffness of right hip, not elsewhere classified: Secondary | ICD-10-CM | POA: Diagnosis not present

## 2021-07-31 DIAGNOSIS — Z7409 Other reduced mobility: Secondary | ICD-10-CM | POA: Diagnosis not present

## 2021-07-31 DIAGNOSIS — M25551 Pain in right hip: Secondary | ICD-10-CM | POA: Diagnosis not present

## 2021-07-31 DIAGNOSIS — R262 Difficulty in walking, not elsewhere classified: Secondary | ICD-10-CM | POA: Diagnosis not present

## 2021-07-31 DIAGNOSIS — R29898 Other symptoms and signs involving the musculoskeletal system: Secondary | ICD-10-CM | POA: Diagnosis not present

## 2021-07-31 DIAGNOSIS — G8929 Other chronic pain: Secondary | ICD-10-CM | POA: Diagnosis not present

## 2021-07-31 DIAGNOSIS — Z789 Other specified health status: Secondary | ICD-10-CM | POA: Diagnosis not present

## 2021-08-07 DIAGNOSIS — Z7409 Other reduced mobility: Secondary | ICD-10-CM | POA: Diagnosis not present

## 2021-08-07 DIAGNOSIS — M25651 Stiffness of right hip, not elsewhere classified: Secondary | ICD-10-CM | POA: Diagnosis not present

## 2021-08-07 DIAGNOSIS — Z789 Other specified health status: Secondary | ICD-10-CM | POA: Diagnosis not present

## 2021-08-07 DIAGNOSIS — G8929 Other chronic pain: Secondary | ICD-10-CM | POA: Diagnosis not present

## 2021-08-07 DIAGNOSIS — M25551 Pain in right hip: Secondary | ICD-10-CM | POA: Diagnosis not present

## 2021-08-07 DIAGNOSIS — R29898 Other symptoms and signs involving the musculoskeletal system: Secondary | ICD-10-CM | POA: Diagnosis not present

## 2021-08-07 DIAGNOSIS — R262 Difficulty in walking, not elsewhere classified: Secondary | ICD-10-CM | POA: Diagnosis not present

## 2021-08-14 DIAGNOSIS — M25651 Stiffness of right hip, not elsewhere classified: Secondary | ICD-10-CM | POA: Diagnosis not present

## 2021-08-14 DIAGNOSIS — R262 Difficulty in walking, not elsewhere classified: Secondary | ICD-10-CM | POA: Diagnosis not present

## 2021-08-14 DIAGNOSIS — S73191S Other sprain of right hip, sequela: Secondary | ICD-10-CM | POA: Diagnosis not present

## 2021-08-14 DIAGNOSIS — M25551 Pain in right hip: Secondary | ICD-10-CM | POA: Diagnosis not present

## 2021-08-14 DIAGNOSIS — G8929 Other chronic pain: Secondary | ICD-10-CM | POA: Diagnosis not present

## 2021-08-14 DIAGNOSIS — Z789 Other specified health status: Secondary | ICD-10-CM | POA: Diagnosis not present

## 2021-08-14 DIAGNOSIS — R29898 Other symptoms and signs involving the musculoskeletal system: Secondary | ICD-10-CM | POA: Diagnosis not present

## 2021-08-14 DIAGNOSIS — Z7409 Other reduced mobility: Secondary | ICD-10-CM | POA: Diagnosis not present

## 2021-08-16 ENCOUNTER — Ambulatory Visit (INDEPENDENT_AMBULATORY_CARE_PROVIDER_SITE_OTHER): Payer: Medicaid Other | Admitting: Sports Medicine

## 2021-08-16 ENCOUNTER — Other Ambulatory Visit: Payer: Self-pay

## 2021-08-16 ENCOUNTER — Ambulatory Visit (INDEPENDENT_AMBULATORY_CARE_PROVIDER_SITE_OTHER): Payer: Medicaid Other

## 2021-08-16 DIAGNOSIS — Z09 Encounter for follow-up examination after completed treatment for conditions other than malignant neoplasm: Secondary | ICD-10-CM

## 2021-08-16 DIAGNOSIS — S73191D Other sprain of right hip, subsequent encounter: Secondary | ICD-10-CM

## 2021-08-16 DIAGNOSIS — M238X1 Other internal derangements of right knee: Secondary | ICD-10-CM

## 2021-08-16 DIAGNOSIS — S73191S Other sprain of right hip, sequela: Secondary | ICD-10-CM

## 2021-08-16 DIAGNOSIS — M25551 Pain in right hip: Secondary | ICD-10-CM | POA: Diagnosis not present

## 2021-08-16 DIAGNOSIS — R531 Weakness: Secondary | ICD-10-CM | POA: Diagnosis not present

## 2021-08-16 DIAGNOSIS — M25561 Pain in right knee: Secondary | ICD-10-CM

## 2021-08-16 IMAGING — DX DG KNEE COMPLETE 4+V*R*
4 series · 4 of 4 positions shown · non-contrast
Comparison: None.

CLINICAL DATA: Right knee pain, no known injury, initial encounter

EXAM:
RIGHT KNEE - COMPLETE 4+ VIEW

[knee ap]
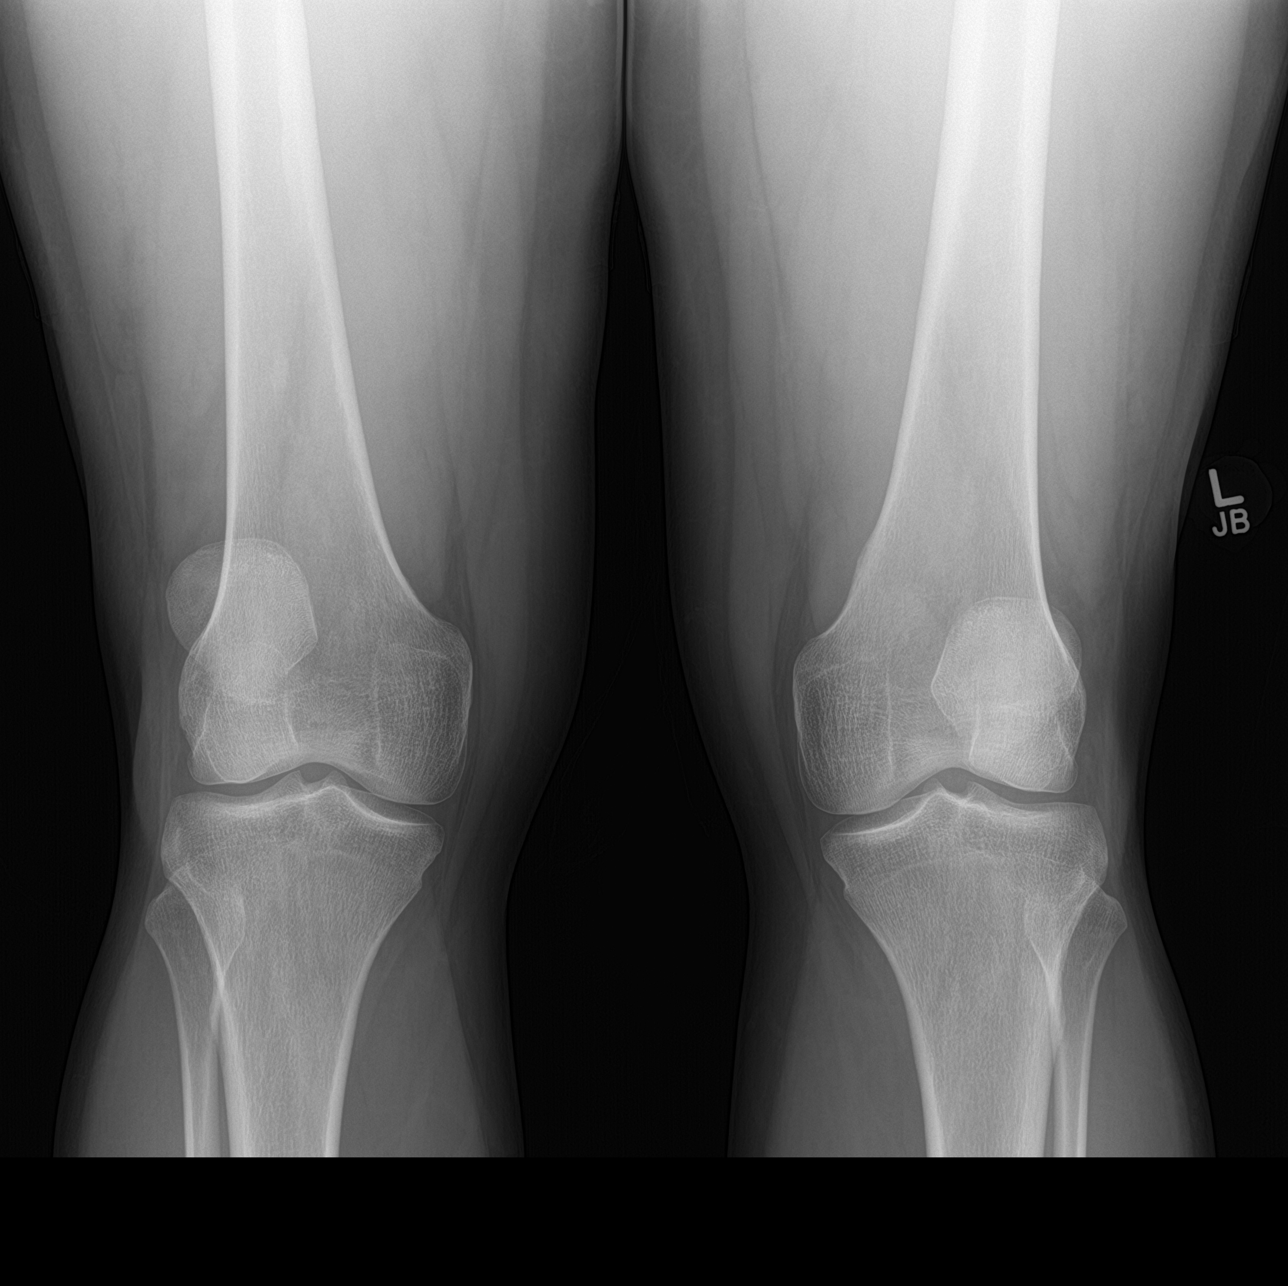

[knee tunnel]
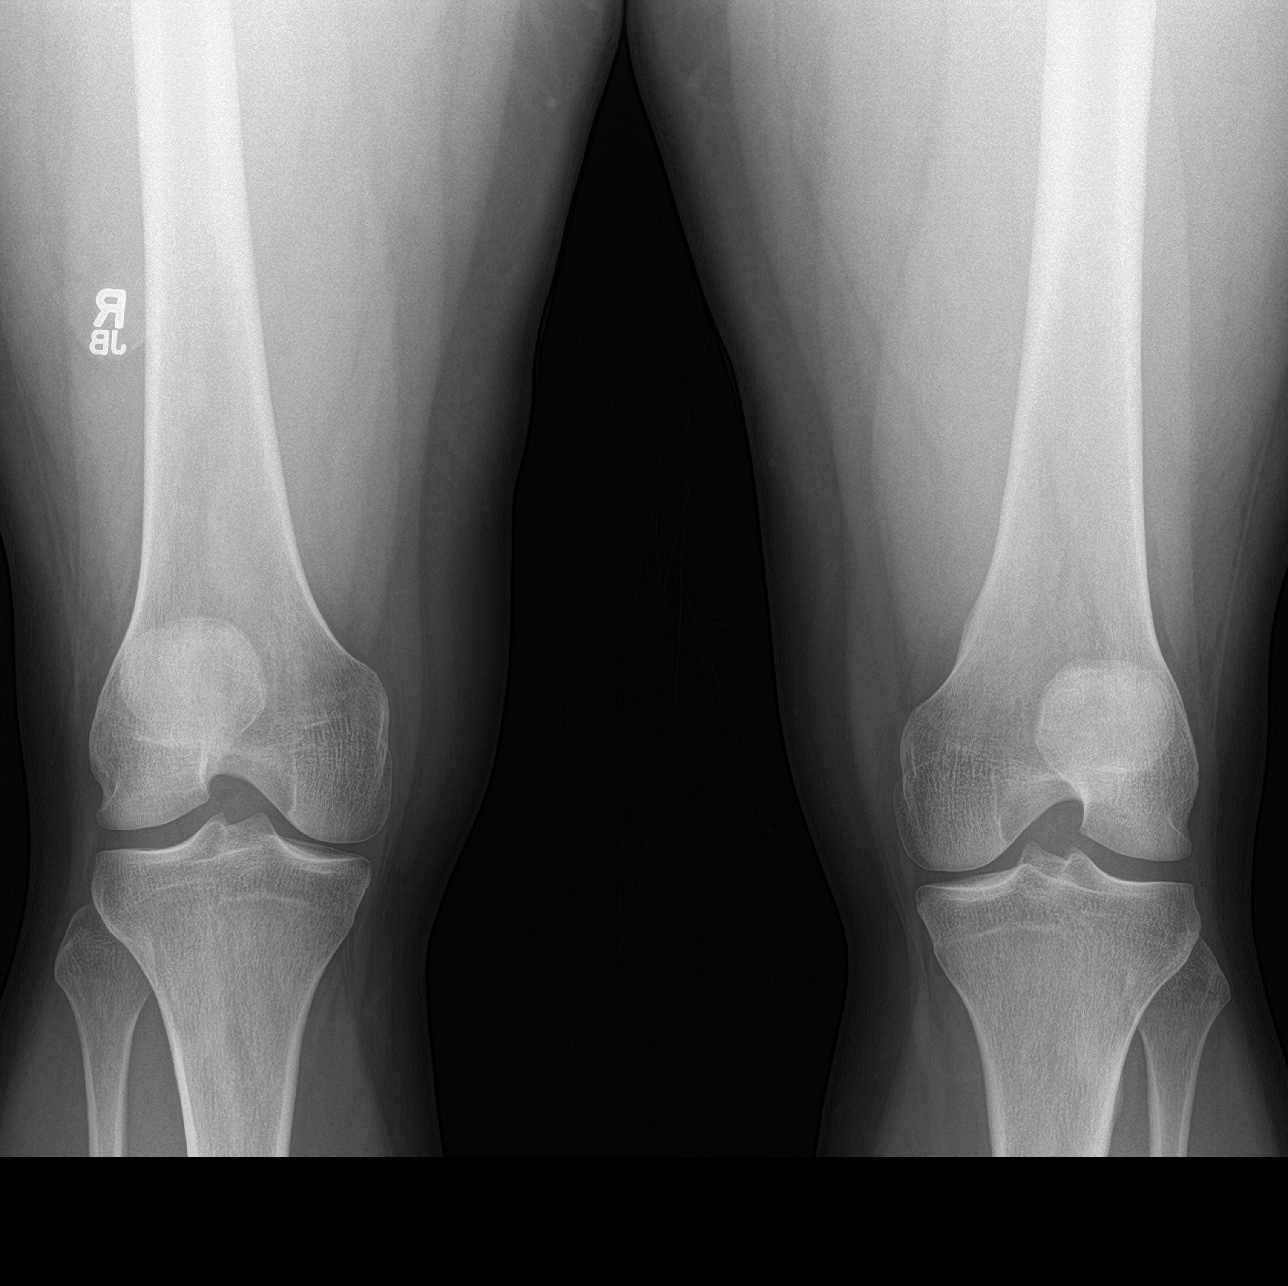

[knee sunrise standing]
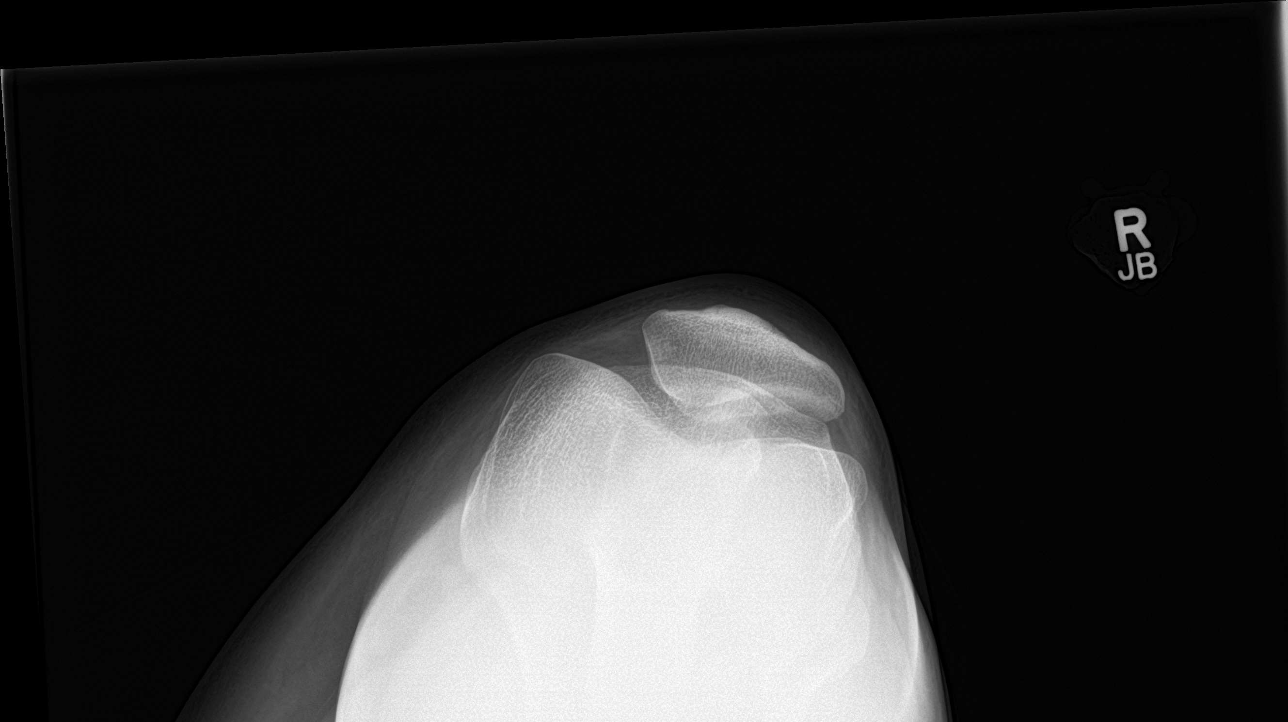

[knee lat]
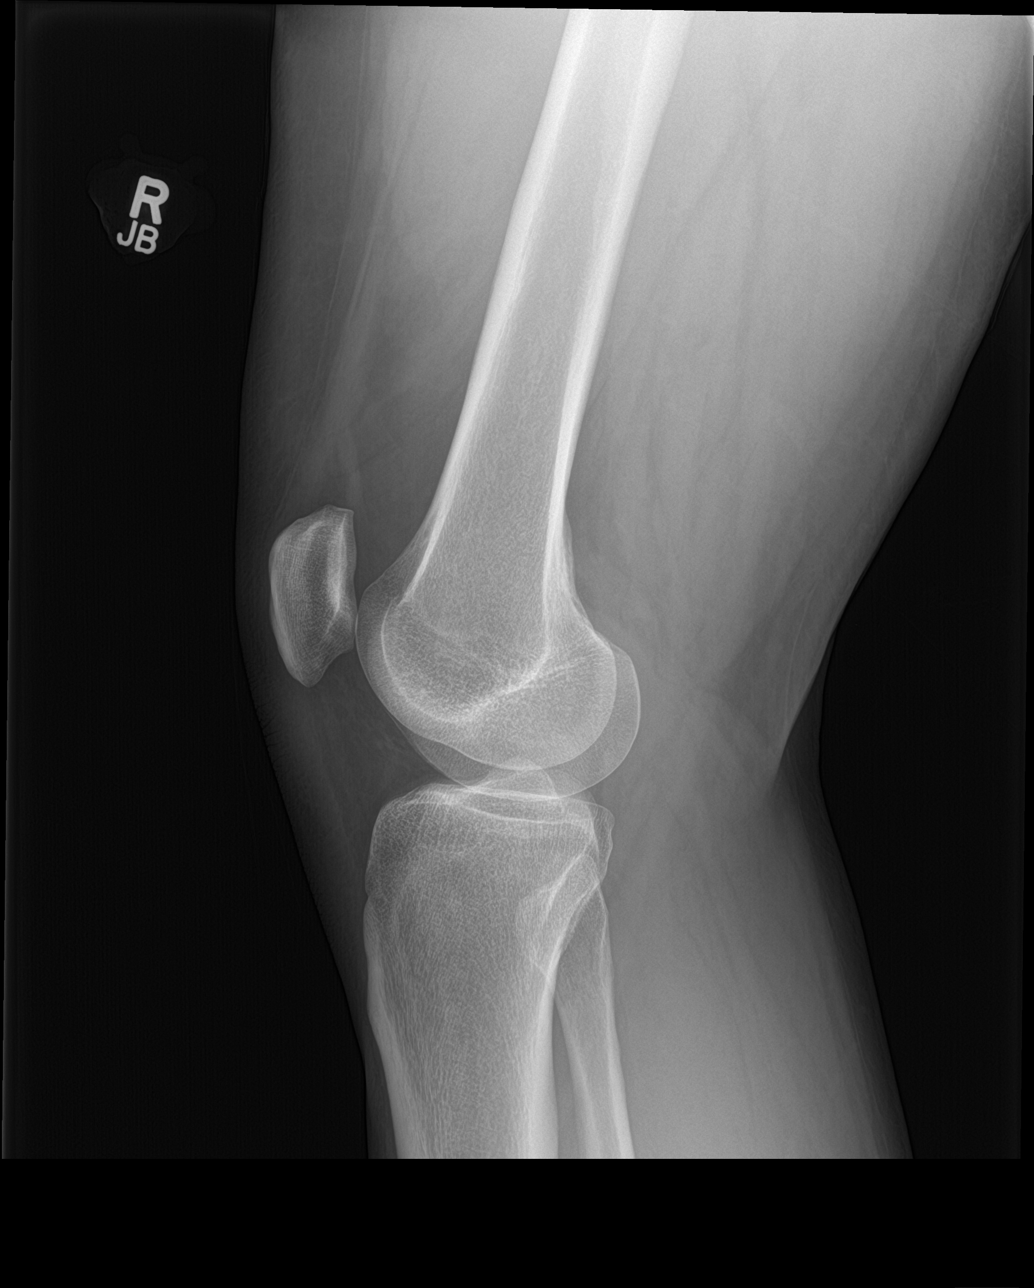

[4 of 4 positions shown; findings below may reference images not displayed]

FINDINGS: Minimal medial joint space narrowing is noted. No acute fracture or
dislocation is seen. No soft tissue abnormality is noted.
IMPRESSION: Minimal degenerative changes as described.

## 2021-08-16 IMAGING — DX DG KNEE 1-2V*L*
1 series · 1 of 1 positions shown · non-contrast
Comparison: None.

CLINICAL DATA: Comparison view for the right knee, initial
encounter

EXAM:
LEFT KNEE - 1 VIEW

[knee ap]
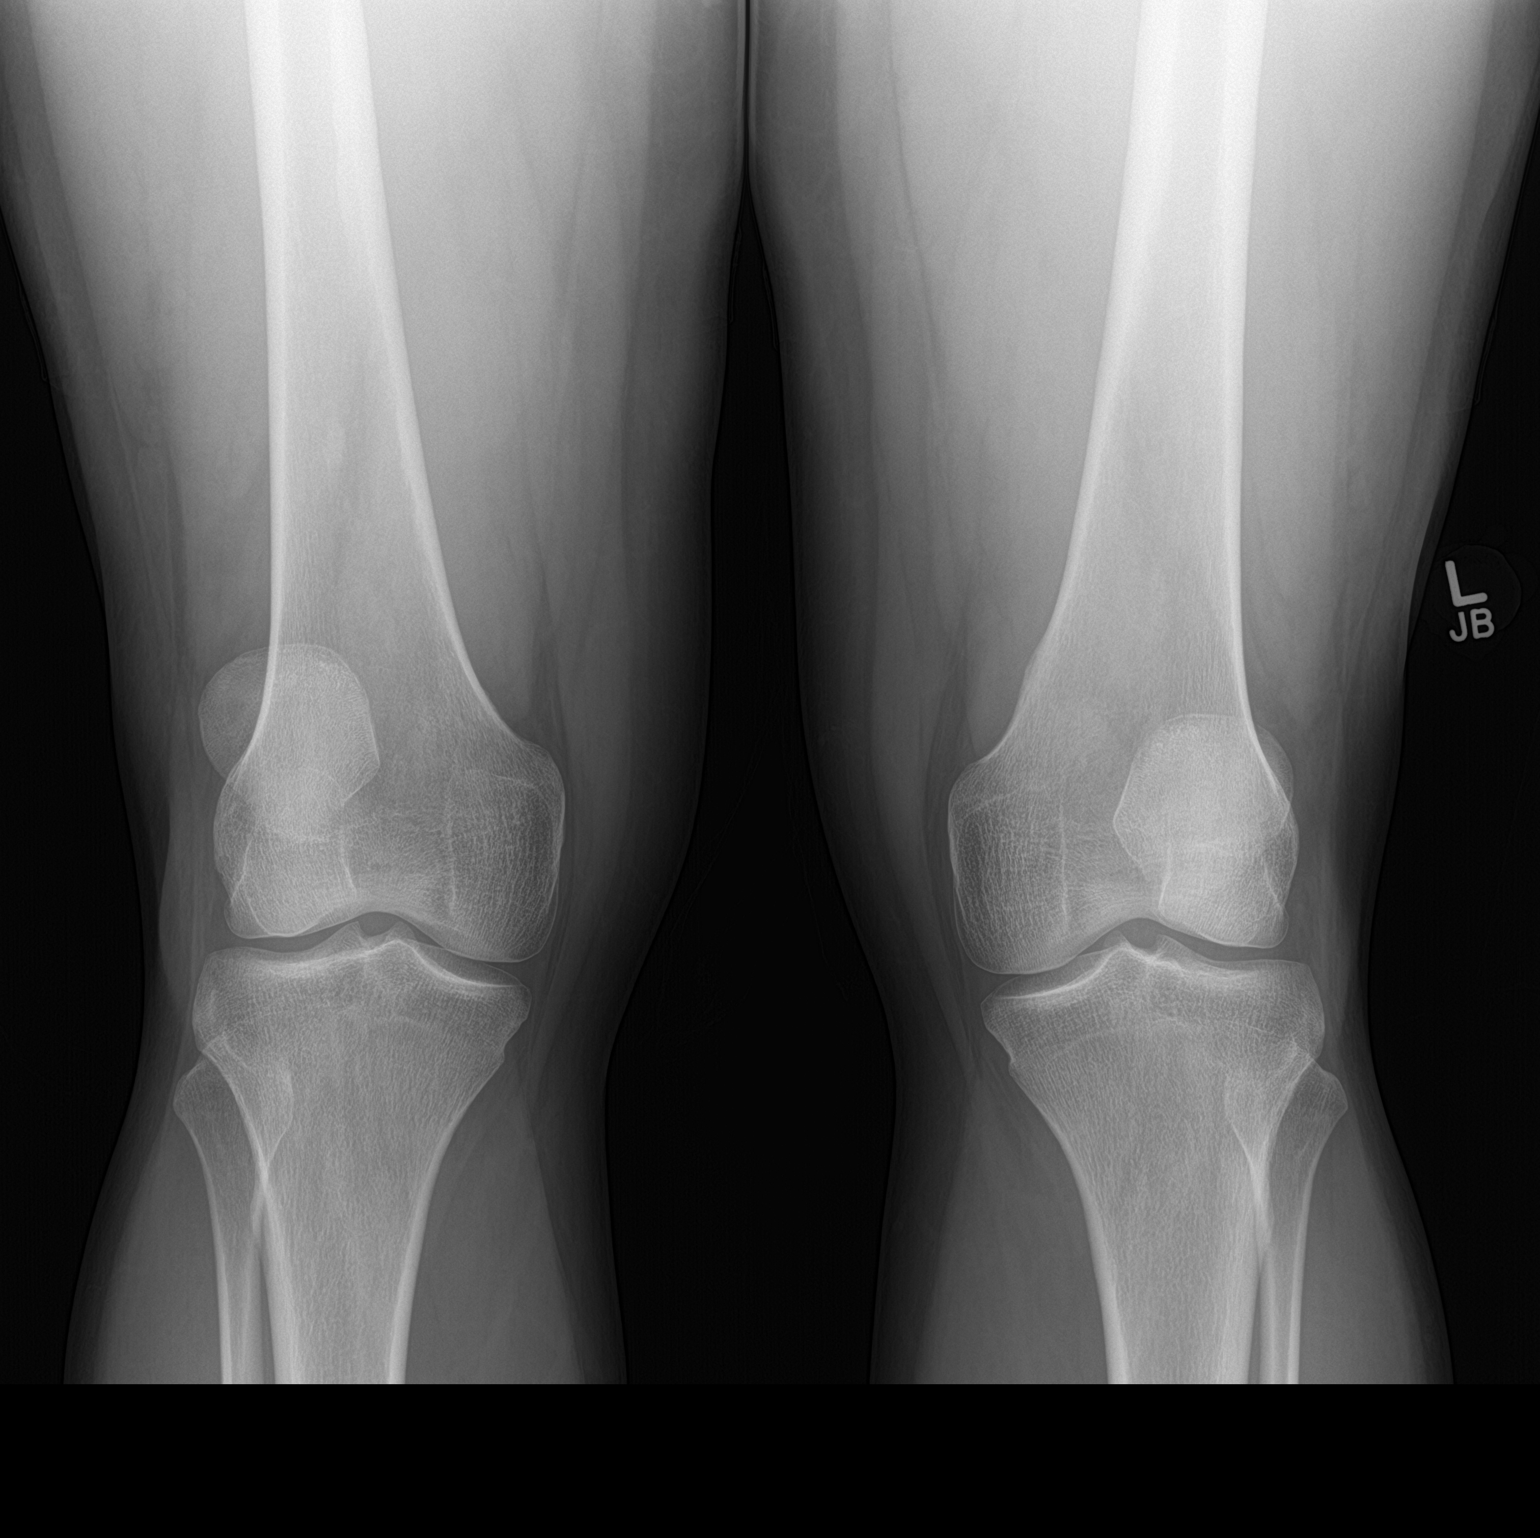

[1 of 1 positions shown; findings below may reference images not displayed]

FINDINGS: No evidence of fracture, dislocation, or joint effusion. No evidence
of arthropathy or other focal bone abnormality. Soft tissues are
unremarkable.
IMPRESSION: No acute abnormality noted.

## 2021-08-16 IMAGING — DX DG HIP (WITH OR WITHOUT PELVIS) 2-3V*R*
3 series · 3 of 3 positions shown · non-contrast
Comparison: [DATE], MRI from [DATE]

CLINICAL DATA: Right hip pain, history of labral tear, subsequent
encounter

EXAM:
DG HIP (WITH OR WITHOUT PELVIS) 3V RIGHT

[pelvis ap]
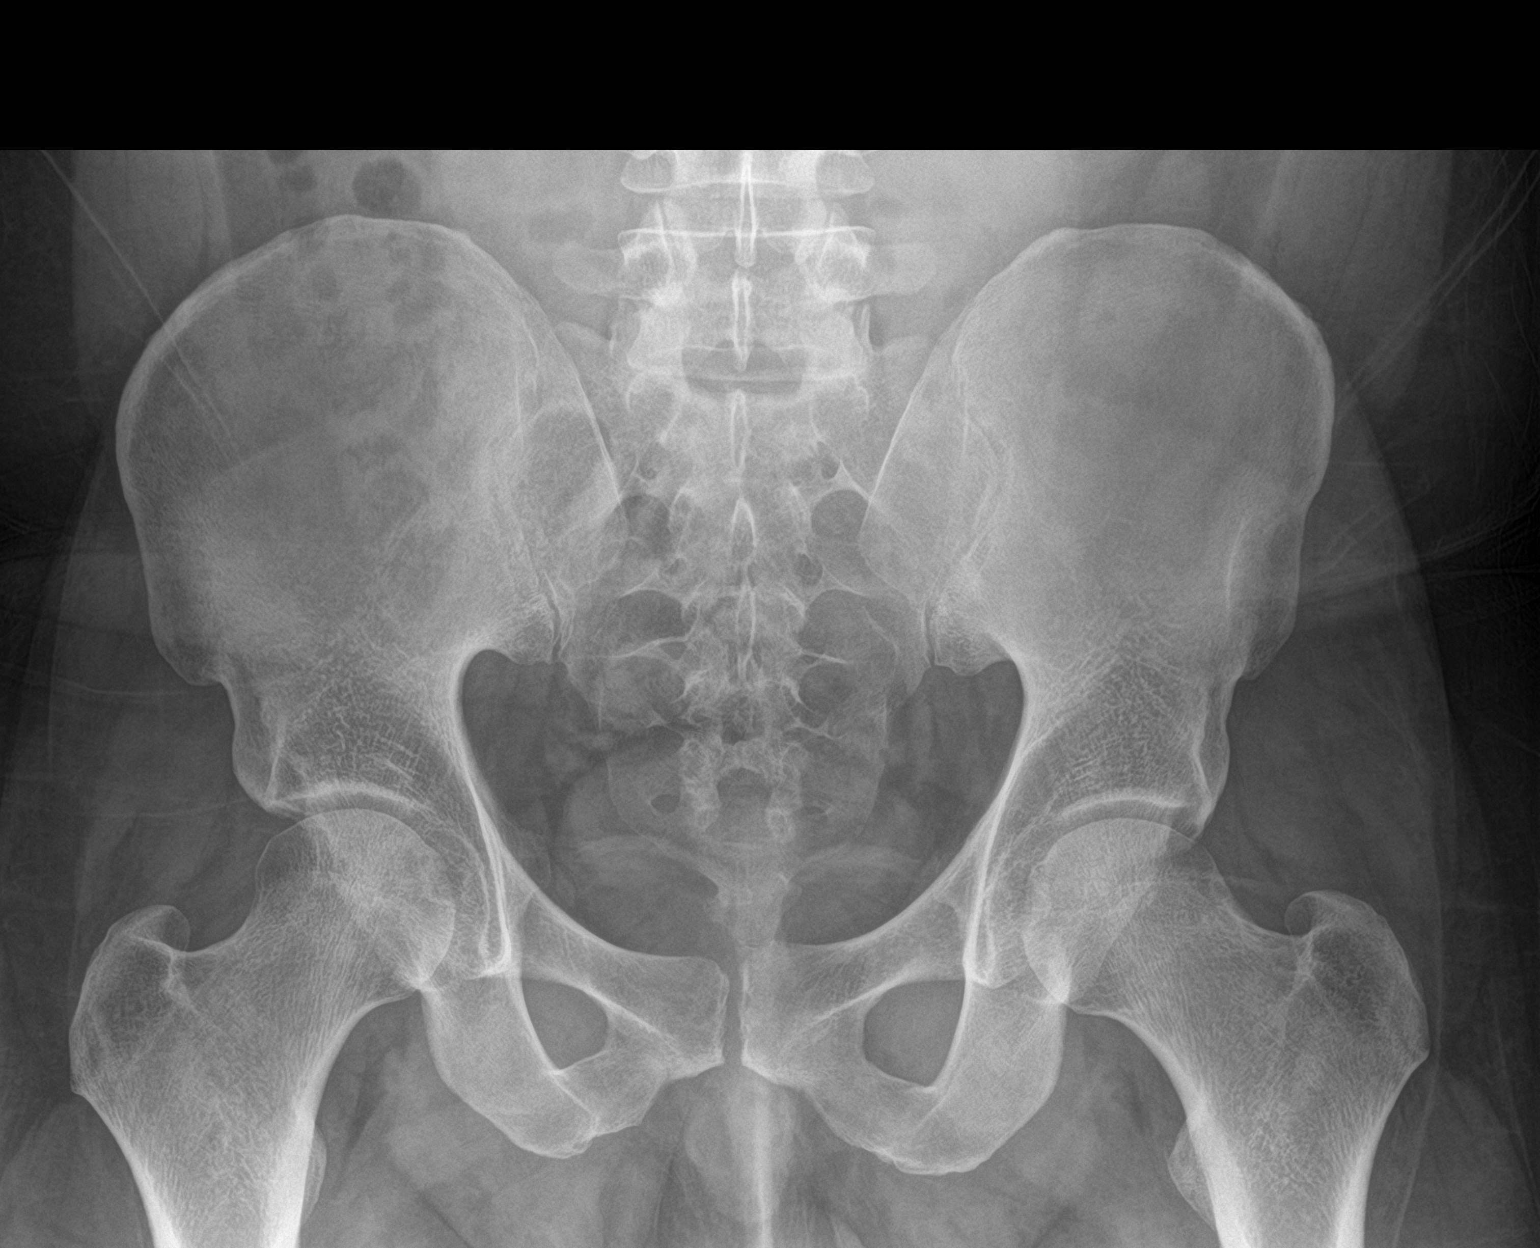

[hip ap]
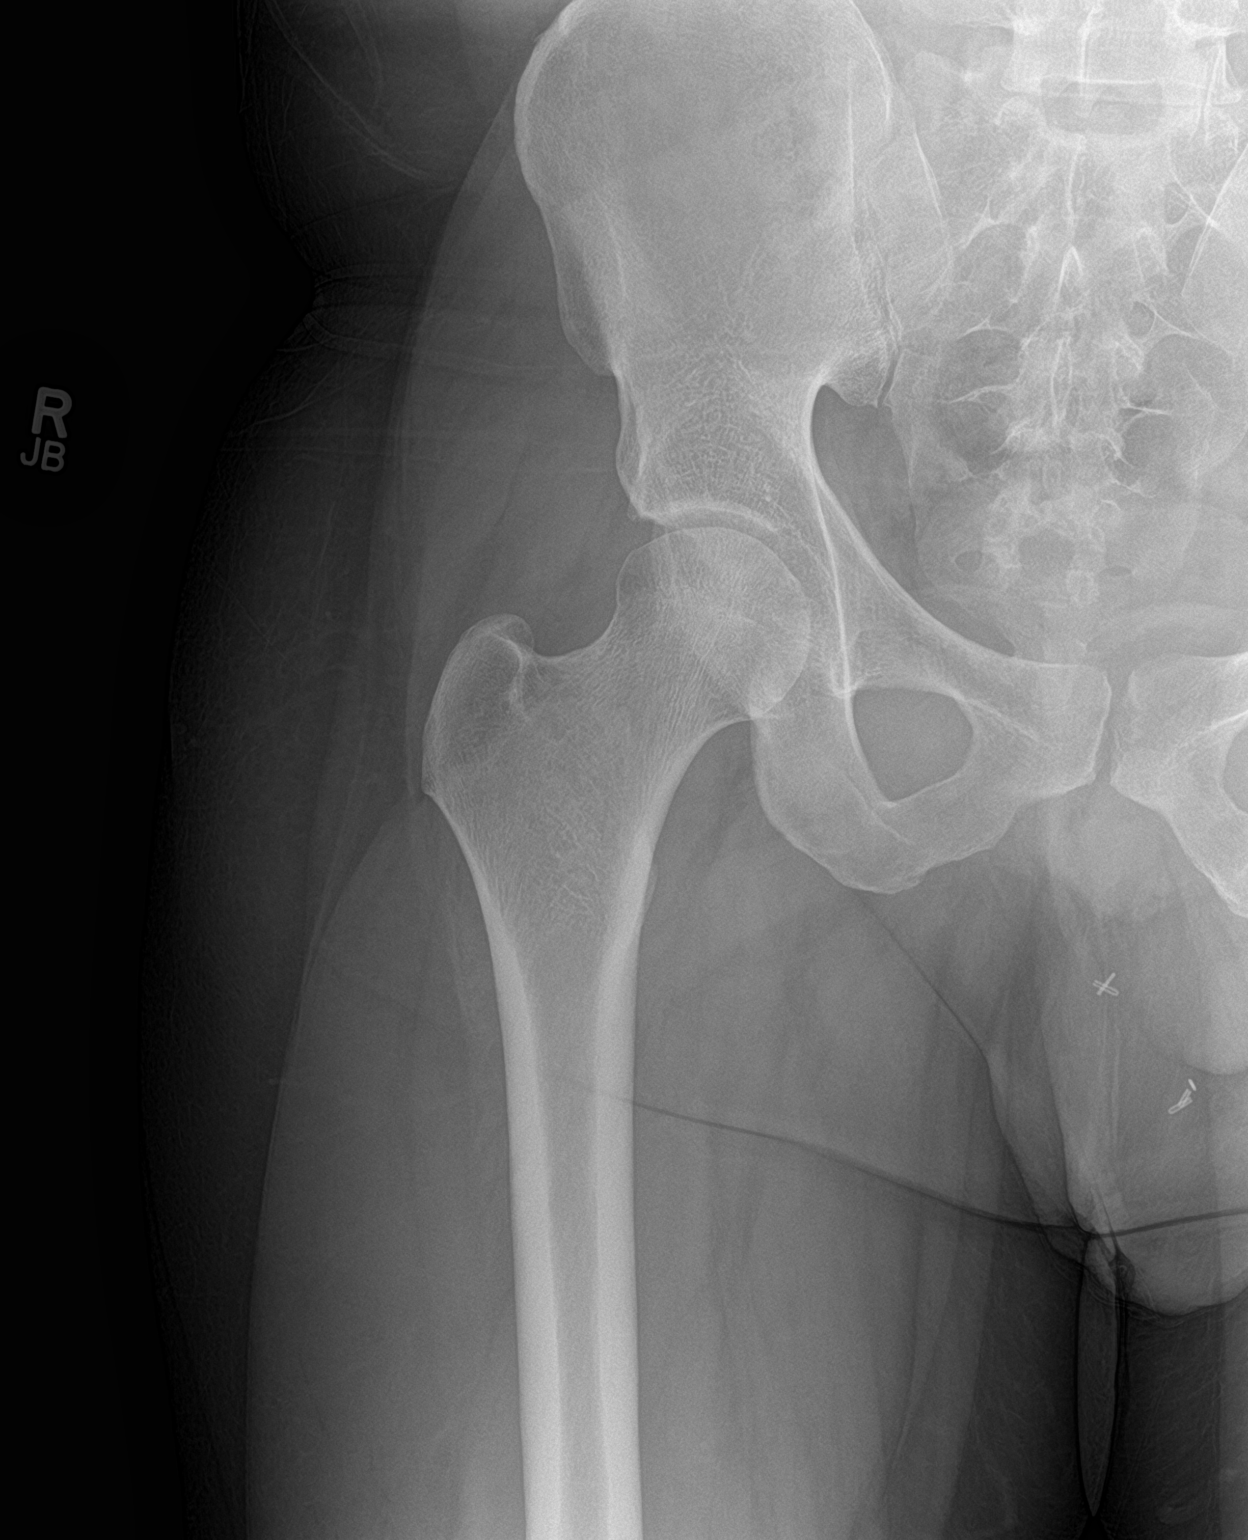

[hip lat]
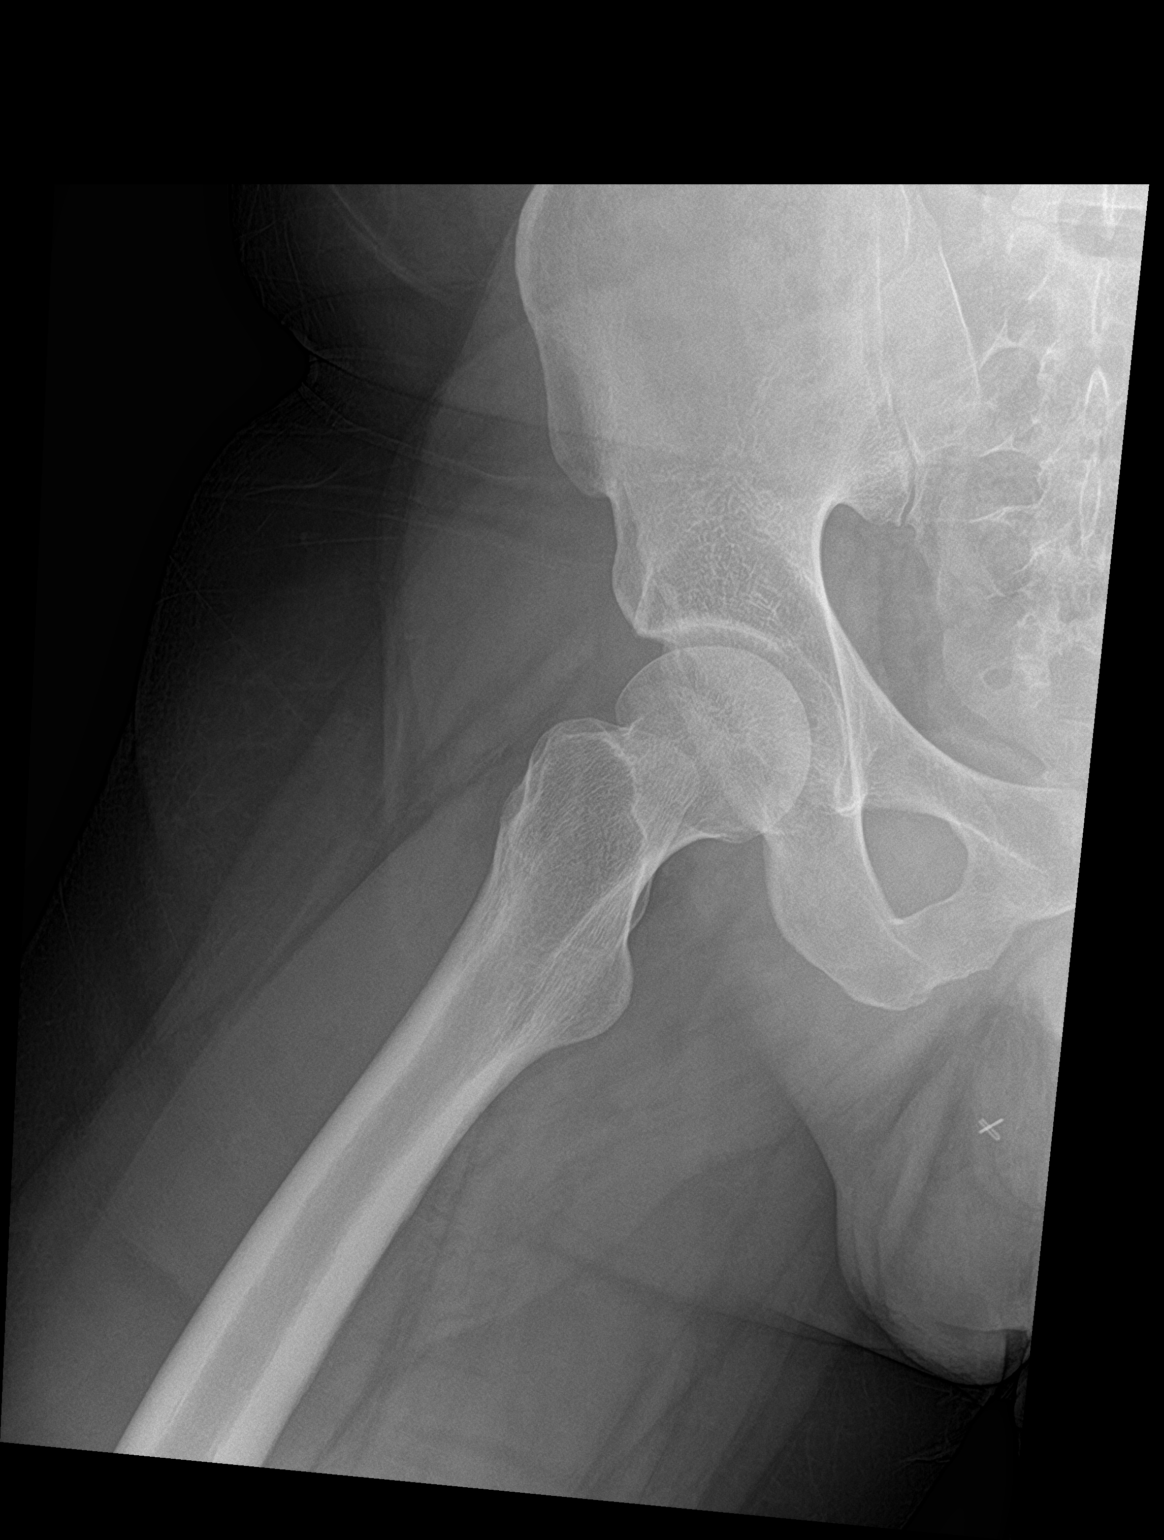

[3 of 3 positions shown; findings below may reference images not displayed]

FINDINGS: Pelvic ring is intact. No acute fracture or dislocation is noted.
Changes of prior vasectomy are seen. No soft tissue changes are
noted.
IMPRESSION: No acute abnormality noted.

## 2021-08-16 MED ORDER — PREDNISONE 50 MG PO TABS: ORAL_TABLET | ORAL | 0 refills | Status: DC

## 2021-08-16 NOTE — Assessment & Plan Note (Signed)
Raymond Owens returns, he is a pleasant 30 year old male with history of juvenile rheumatoid arthritis, we ultimately diagnosed him with a labral tear earlier this year, he ultimately was seen at the [facility] hip center, he had a hip arthroscopy with anchoring of the labrum, did well for a while and unfortunately is having increasing pain in the right hip and groin, moderate grinding. Moderate gelling. He was on a higher dose of prednisone, this was discontinued around his surgery, and he was restarted on very low-dose prednisone. Im inclined to think that he will need a higher dose of daily prednisone, he has not yet touch base with his rheumatologist. He has also not yet touch base with Dr. Caswell Corwin with hip arthroscopy. We will do a 50 mg prednisone burst for 5 days, he can then drop back down to 1 mg of prednisone do I do think he will need milligrams 5 daily.

## 2021-08-16 NOTE — Progress Notes (Signed)
° ° °  Procedures performed today:    None.  Independent interpretation of notes and tests performed by another provider:   None.  Brief History, Exam, Impression, and Recommendations:    Labral tear of right hip joint Raymond Post returns, he is a pleasant 30 year old male with history of juvenile rheumatoid arthritis, we ultimately diagnosed him with a labral tear earlier this year, he ultimately was seen at the [facility] hip center, he had a hip arthroscopy with anchoring of the labrum, did well for a while and unfortunately is having increasing pain in the right hip and groin, moderate grinding. Moderate gelling. He was on a higher dose of prednisone, this was discontinued around his surgery, and he was restarted on very low-dose prednisone. Im inclined to think that he will need a higher dose of daily prednisone, he has not yet touch base with his rheumatologist. He has also not yet touch base with Dr. Caswell Corwin with hip arthroscopy. We will do a 50 mg prednisone burst for 5 days, he can then drop back down to 1 mg of prednisone do I do think he will need milligrams 5 daily.  Crepitus of joint of right knee Very mild right knee patellofemoral crepitus, not really any pain, adding x-rays, home conditioning, the prednisone above will help. Return in 6 weeks for this.  Weakness Fatigue, weakness, excessive daytime sleepiness, I am not sure what to make of this, he needs to talk to his medical provider about it. Certainly could be related to dropping down to 1 mg prednisone after his hospitalization, the 50 mg burst we are going to do should help. Laboratory abnormalities, affective disorders, sleep apnea can also present similarly.  Chronic process with exacerbation and pharmacologic intervention  ___________________________________________ Ihor Austin. Benjamin Owens, M.D., ABFM., CAQSM. Primary Care and Sports Medicine [facility]  Adjunct Instructor of Family Medicine   [employer]

## 2021-08-16 NOTE — Assessment & Plan Note (Addendum)
Fatigue, weakness, excessive daytime sleepiness, I am not sure what to make of this, he needs to talk to his medical provider about it. Certainly could be related to dropping down to 1 mg prednisone after his hospitalization, the 50 mg burst we are going to do should help. Laboratory abnormalities, affective disorders, sleep apnea can also present similarly.

## 2021-08-16 NOTE — Assessment & Plan Note (Signed)
Very mild right knee patellofemoral crepitus, not really any pain, adding x-rays, home conditioning, the prednisone above will help. Return in 6 weeks for this.

## 2021-08-22 DIAGNOSIS — Z789 Other specified health status: Secondary | ICD-10-CM | POA: Diagnosis not present

## 2021-08-22 DIAGNOSIS — S73191S Other sprain of right hip, sequela: Secondary | ICD-10-CM | POA: Diagnosis not present

## 2021-08-22 DIAGNOSIS — M25551 Pain in right hip: Secondary | ICD-10-CM | POA: Diagnosis not present

## 2021-08-22 DIAGNOSIS — G8929 Other chronic pain: Secondary | ICD-10-CM | POA: Diagnosis not present

## 2021-08-22 DIAGNOSIS — R29898 Other symptoms and signs involving the musculoskeletal system: Secondary | ICD-10-CM | POA: Diagnosis not present

## 2021-08-22 DIAGNOSIS — R262 Difficulty in walking, not elsewhere classified: Secondary | ICD-10-CM | POA: Diagnosis not present

## 2021-08-22 DIAGNOSIS — M25651 Stiffness of right hip, not elsewhere classified: Secondary | ICD-10-CM | POA: Diagnosis not present

## 2021-08-22 DIAGNOSIS — Z7409 Other reduced mobility: Secondary | ICD-10-CM | POA: Diagnosis not present

## 2021-08-27 ENCOUNTER — Other Ambulatory Visit: Payer: Self-pay

## 2021-08-27 ENCOUNTER — Ambulatory Visit: Payer: Medicaid Other | Admitting: Physician Assistant

## 2021-08-27 ENCOUNTER — Encounter: Payer: Self-pay | Admitting: Physician Assistant

## 2021-08-27 VITALS — BP 127/68 | HR 72 | Ht 71.0 in | Wt 236.0 lb

## 2021-08-27 DIAGNOSIS — G478 Other sleep disorders: Secondary | ICD-10-CM

## 2021-08-27 DIAGNOSIS — R9431 Abnormal electrocardiogram [ECG] [EKG]: Secondary | ICD-10-CM | POA: Diagnosis not present

## 2021-08-27 DIAGNOSIS — R42 Dizziness and giddiness: Secondary | ICD-10-CM

## 2021-08-27 DIAGNOSIS — R5383 Other fatigue: Secondary | ICD-10-CM | POA: Diagnosis not present

## 2021-08-27 NOTE — Progress Notes (Signed)
Subjective:    Patient ID: Raymond Owens, male    DOB: [birth year], 31 y.o.   MRN: [id number]  HPI Pt is a 31 yo male with Adult Onset Stills disease with chronic pain and fatigue who presents to the clinic with worsening fatigue, dizziness with position changes. He wakes up in the morning tired and then throughout the day he can stand and feel weak. No lower extremity edema.   Wife is a Engineer, civil (consulting) and took orthostatic BP and HR at home BP stayed close but HR from laying to sitting to standing went 57 to 94 to 67.   HR at night is 50s and pulse ox 97 percent  Never had sleep apnea testing.   He did go off prednisone for 10 days but then started back 1mg  daily. He sees rheumatology for Stills disease management.   .. 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   Double vision 08/05/2019   Weakness 08/05/2019   Adjustment disorder with depressed mood 08/05/2019   Acute midline low back pain without sciatica 08/05/2019   Acute midline thoracic back pain 10/14/2019   Ankle weakness 04/02/2020   Chronic pain of both ankles 04/02/2020   Chronic dyspnea 06/01/2020   Pain of left calf 06/01/2020   Chronic pain syndrome 09/14/2020   Labral tear of right hip joint 09/18/2020   Shortness of breath on exertion 09/18/2020   Crepitus of joint of right knee 08/16/2021   Non-restorative sleep 08/27/2021   Resolved Ambulatory Problems     Diagnosis Date Noted   Bacterial meningitis 07/08/2019   Past Medical History:  Diagnosis Date   Hyperlipidemia    Still's disease (HCC)       Review of Systems See HPI.     Objective:   Physical Exam  NSR No low ext edema Normal breathing no abnormal breath sounds Mood WNL       Assessment & Plan:  11/15/2020[name] KitchenJosha was seen today for insomnia.  Diagnoses and all orders for this visit:  Non-restorative sleep -     Home sleep test  Dizziness -     Home sleep test -     COMPLETE METABOLIC PANEL WITH GFR -     Viviann Spare and Folate Panel -     VITAMIN D 25 Hydroxy (Vit-D Deficiency, Fractures) -     Hemoglobin A1c -     CBC with Differential/Platelet -     Fe+TIBC+Fer -     TSH  No energy -     Home sleep test -     COMPLETE METABOLIC PANEL WITH GFR -     C16 and Folate Panel -     VITAMIN D 25 Hydroxy (Vit-D Deficiency, Fractures) -     Hemoglobin A1c -     CBC with Differential/Platelet -     Fe+TIBC+Fer -     TSH  Abnormal EKG   EKG showed early  repolaration with appearance of slight 1 box ST elevation in rhythm and intererior leads.  Will get stress test.  Will get labs to look at fatigue and dizziness.  Home sleep test ordered Orthostatic BP stayed stable HR had some changes borderline significant. ? POTS Continue to chart BP at home

## 2021-08-27 NOTE — Patient Instructions (Addendum)
Will get sleep study Please get labs today Will get scheduled for stress test  Postural Orthostatic Tachycardia Syndrome Postural orthostatic tachycardia syndrome (POTS) is a group of symptoms that occur along with an increase in heart rate when a person stands up after lying down. The symptoms include light-headedness or fainting, and they improve when the person lies back down. POTS may be associated with another medical condition, or it may occur on its own. What are the causes? The cause of this condition is not known, but many conditions and diseases are associated with it. What increases the risk? This condition is more likely to develop in: Women 49-62 years old. Women who are pregnant. Women who are in their period (menstruating). People who have certain conditions, such as: Infection from a virus. Diseases that cause the body's defense system (immune system) to attack healthy organs. These are called autoimmune diseases. Losing a lot of red blood cells (anemia). Losing too much water in the body (dehydration). An overactive thyroid (hyperthyroidism). People who take certain medicines. People who have had a major injury. People who have had surgery. What are the signs or symptoms? The most common symptom of this condition is light-headedness when you stand up from a lying or sitting position. Other symptoms may include: Feeling a rapid increase in the heartbeat (tachycardia) within 10 minutes of standing up. Chest pain. Shortness of breath. Breathing that is deeper and faster than normal (hyperventilation). Fainting. Confusion. Trembling. Weakness. Headache. Anxiety. Nausea. Sweating or flushing. Symptoms may be worse in the morning, and they may be relieved by lying down. How is this diagnosed? This condition is diagnosed based on: Your symptoms. Your medical history. A physical exam. Checking your heart rate when you are lying down and after you stand  up. Checking your blood pressure when you go from lying down to standing up. Blood and urine tests to measure hormones that change with blood pressure. The blood tests will be done when you are lying down and when you are standing up. You may have other tests to check for conditions or diseases that are associated with POTS. How is this treated? Treatment for this condition depends on how severe your symptoms are and whether you have any conditions or diseases that are associated with POTS. Treatment may involve: Treating any conditions or diseases that are associated with POTS. Drinking two glasses of water before getting up from a lying position. Increasing salt (sodium) in your diet. Taking medicine to control blood pressure and heart rate (beta-blocker). Avoiding certain medicines. Starting an exercise program under the supervision of a health care provider. Follow these instructions at home: Medicines Take over-the-counter and prescription medicines only as told by your health care provider. Let your health care provider know about all prescription or over-the-counter medicines you take. These include herbs, vitamins, and supplements. You may need to stop or adjust some medicines if they cause this condition. Talk with your health care provider before starting any new medicines. Eating and drinking  Drink enough fluid to keep your urine pale yellow. If told by your health care provider, drink two glasses of water before getting up from a lying position. Follow instructions from your health care provider about how much sodium you should include in your diet. Avoid heavy meals. Eat several small meals a day instead of a few large meals. General instructions Do an aerobic exercise for 20 minutes a day, at least 3 days a week. Aerobic exercises are those that cause your heart  to beat faster. Ask your health care provider what kinds of exercise are safe for you. Do not use any products that  contain nicotine or tobacco. These products include cigarettes, chewing tobacco, and vaping devices, such as e-cigarettes. These can interfere with blood flow. If you need help quitting, ask your health care provider. Keep all follow-up visits. This is important. Contact a health care provider if: Your symptoms do not improve after treatment. Your symptoms get worse. You develop new symptoms. Get help right away if: You have chest pain. You have difficulty breathing. You have fainting episodes. These symptoms may be an emergency. Get help right away. Call 911. Do not wait to see if the symptoms will go away. Do not drive yourself to the hospital. Summary POTS is a group of symptoms that occur along with an increase in heart rate when a person stands up after lying down. The most common symptom is light-headedness when you stand up. Treatment for this condition includes treating any underlying conditions, drinking plenty of water, stopping or changing some medicines, or starting an exercise program. Get help right away if you have chest pain, difficulty breathing, or fainting episodes. These symptoms may be an emergency. This information is not intended to replace advice given to you by your health care provider. Make sure you discuss any questions you have with your health care provider. Document Revised: 02/21/2021 Document Reviewed: 02/21/2021 Elsevier Patient Education  [place].

## 2021-08-28 ENCOUNTER — Other Ambulatory Visit: Payer: Self-pay | Admitting: Neurology

## 2021-08-28 DIAGNOSIS — R748 Abnormal levels of other serum enzymes: Secondary | ICD-10-CM

## 2021-08-28 LAB — CBC WITH DIFFERENTIAL/PLATELET
Absolute Monocytes: 706 cells/uL (ref 200–950)
Basophils Absolute: 29 cells/uL (ref 0–200)
Basophils Relative: 0.4 %
Eosinophils Absolute: 173 cells/uL (ref 15–500)
Eosinophils Relative: 2.4 %
HCT: 48.5 % (ref 38.5–50.0)
Hemoglobin: 16.4 g/dL (ref 13.2–17.1)
Lymphs Abs: 2023 cells/uL (ref 850–3900)
MCH: 31.4 pg (ref 27.0–33.0)
MCHC: 33.8 g/dL (ref 32.0–36.0)
MCV: 92.9 fL (ref 80.0–100.0)
MPV: 11 fL (ref 7.5–12.5)
Monocytes Relative: 9.8 %
Neutro Abs: 4270 cells/uL (ref 1500–7800)
Neutrophils Relative %: 59.3 %
Platelets: 286 10*3/uL (ref 140–400)
RBC: 5.22 10*6/uL (ref 4.20–5.80)
RDW: 13.4 % (ref 11.0–15.0)
Total Lymphocyte: 28.1 %
WBC: 7.2 10*3/uL (ref 3.8–10.8)

## 2021-08-28 LAB — IRON,TIBC AND FERRITIN PANEL
%SAT: 40 % (calc) (ref 20–48)
Ferritin: 46 ng/mL (ref 38–380)
Iron: 150 ug/dL (ref 50–195)
TIBC: 375 mcg/dL (calc) (ref 250–425)

## 2021-08-28 LAB — COMPLETE METABOLIC PANEL WITH GFR
AG Ratio: 1.7 (calc) (ref 1.0–2.5)
ALT: 70 U/L — ABNORMAL HIGH (ref 9–46)
AST: 32 U/L (ref 10–40)
Albumin: 4.7 g/dL (ref 3.6–5.1)
Alkaline phosphatase (APISO): 66 U/L (ref 36–130)
BUN: 11 mg/dL (ref 7–25)
CO2: 30 mmol/L (ref 20–32)
Calcium: 10.2 mg/dL (ref 8.6–10.3)
Chloride: 103 mmol/L (ref 98–110)
Creat: 0.76 mg/dL (ref 0.60–1.24)
Globulin: 2.8 g/dL (calc) (ref 1.9–3.7)
Glucose, Bld: 87 mg/dL (ref 65–99)
Potassium: 4.6 mmol/L (ref 3.5–5.3)
Sodium: 139 mmol/L (ref 135–146)
Total Bilirubin: 0.7 mg/dL (ref 0.2–1.2)
Total Protein: 7.5 g/dL (ref 6.1–8.1)
eGFR: 125 mL/min/{1.73_m2} (ref >=60)

## 2021-08-28 LAB — HEMOGLOBIN A1C
Hgb A1c MFr Bld: 5 % of total Hgb (ref <5.7)
Mean Plasma Glucose: 97 mg/dL
eAG (mmol/L): 5.4 mmol/L

## 2021-08-28 LAB — B12 AND FOLATE PANEL
Folate: 18.6 ng/mL
Vitamin B-12: 513 pg/mL (ref 200–1100)

## 2021-08-28 LAB — TSH: TSH: 1.23 mIU/L (ref 0.40–4.50)

## 2021-08-28 LAB — VITAMIN D 25 HYDROXY (VIT D DEFICIENCY, FRACTURES): Vit D, 25-Hydroxy: 38 ng/mL (ref 30–100)

## 2021-08-28 NOTE — Progress Notes (Signed)
Raymond Owens,   alC is good.  Normal CBC with good WBC and hemoglobin Iron looks good Iron stores ok but a little on the low side Thyroid is perfect Vitamin D is ok but on low side. How much vitamin D are you taking a day?  B12 looks great Kidney looks great One liver enzyme is elevated just a bit. Are you drinking any alcohol or taking tylenol products. If so stop and lets recheck in 4 weeks.

## 2021-09-09 ENCOUNTER — Ambulatory Visit: Payer: Medicaid Other | Admitting: Physician Assistant

## 2021-09-23 ENCOUNTER — Encounter: Payer: Self-pay | Admitting: Physician Assistant

## 2021-09-26 ENCOUNTER — Telehealth (HOSPITAL_COMMUNITY): Payer: Self-pay | Admitting: *Deleted

## 2021-09-26 NOTE — Telephone Encounter (Signed)
Close encounter 

## 2021-09-27 ENCOUNTER — Other Ambulatory Visit: Payer: Self-pay

## 2021-09-27 ENCOUNTER — Ambulatory Visit (HOSPITAL_COMMUNITY)
Admission: RE | Admit: 2021-09-27 | Discharge: 2021-09-27 | Disposition: A | Discharge status: Home / Self Care | Payer: Medicaid Other | Source: Ambulatory Visit | Attending: Cardiology | Admitting: Cardiology

## 2021-09-27 ENCOUNTER — Ambulatory Visit: Payer: Medicaid Other | Admitting: Sports Medicine

## 2021-09-27 DIAGNOSIS — R9431 Abnormal electrocardiogram [ECG] [EKG]: Secondary | ICD-10-CM | POA: Diagnosis not present

## 2021-09-27 DIAGNOSIS — S73191S Other sprain of right hip, sequela: Secondary | ICD-10-CM | POA: Diagnosis not present

## 2021-09-27 DIAGNOSIS — R531 Weakness: Secondary | ICD-10-CM

## 2021-09-27 LAB — EXERCISE TOLERANCE TEST
Angina Index: 0
Duke Treadmill Score: 10
Estimated workload: 11.7
Exercise duration (min): 10 min
Exercise duration (sec): 0 s
MPHR: 191 {beats}/min
Peak HR: 184 {beats}/min
Percent HR: 96 %
Rest HR: 85 {beats}/min
ST Depression (mm): 0 mm

## 2021-09-27 NOTE — Progress Notes (Signed)
° ° °  Procedures performed today:    None.  Independent interpretation of notes and tests performed by another provider:   None.  Brief History, Exam, Impression, and Recommendations:    Labral tear of right hip joint Raymond Owens is a pleasant 31 year old male, he has juvenile rheumatoid arthritis, currently being transitioned to Biologics with his rheumatologist. He had a labral repair with Dr. Aretha Parrot at the [facility] hip center. Unfortunately he started to have increasing pain right hip, we added some prednisone, 50 mg daily made him sick, 20 mg was okay and then he dropped back down to 1 mg which seems to have helped his symptoms significantly. Very little pain now, I think were just going to watch this for now but should he decide to proceed with advanced imaging or have worsening of symptoms Luvenia Starch has already ordered a hip MRI with contrast and we would just need to get him on my schedule 30 minutes prior for the arthrogram injection component. Return to see me as needed.    ___________________________________________ Gwen Her. Dianah Field, M.D., ABFM., CAQSM. Primary Care and [place] Instructor of [place] [employer] Medicine

## 2021-09-27 NOTE — Assessment & Plan Note (Signed)
[name] is a pleasant 31 year old male, he has juvenile rheumatoid arthritis, currently being transitioned to Biologics with his rheumatologist. He had a labral repair with Dr. Caswell Corwin at the [facility] hip center. Unfortunately he started to have increasing pain right hip, we added some prednisone, 50 mg daily made him sick, 20 mg was okay and then he dropped back down to 1 mg which seems to have helped his symptoms significantly. Very little pain now, I think were just going to watch this for now but should he decide to proceed with advanced imaging or have worsening of symptoms Lesly Rubenstein has already ordered a hip MRI with contrast and we would just need to get him on my schedule 30 minutes prior for the arthrogram injection component. Return to see me as needed.

## 2021-09-30 ENCOUNTER — Other Ambulatory Visit: Payer: Self-pay | Admitting: Physician Assistant

## 2021-09-30 DIAGNOSIS — M061 Adult-onset Still's disease: Secondary | ICD-10-CM

## 2021-09-30 DIAGNOSIS — Z9889 Other specified postprocedural states: Secondary | ICD-10-CM | POA: Diagnosis not present

## 2021-09-30 DIAGNOSIS — M25551 Pain in right hip: Secondary | ICD-10-CM | POA: Diagnosis not present

## 2021-09-30 NOTE — Progress Notes (Signed)
No ST deviation was noted. Normal BP and exercise capacity looked good. Normal stress test.

## 2021-11-11 DIAGNOSIS — Z79899 Other long term (current) drug therapy: Secondary | ICD-10-CM | POA: Diagnosis not present

## 2021-11-11 DIAGNOSIS — R5382 Chronic fatigue, unspecified: Secondary | ICD-10-CM | POA: Diagnosis not present

## 2021-11-11 DIAGNOSIS — Z7952 Long term (current) use of systemic steroids: Secondary | ICD-10-CM | POA: Diagnosis not present

## 2021-11-11 DIAGNOSIS — M0820 Juvenile rheumatoid arthritis with systemic onset, unspecified site: Secondary | ICD-10-CM | POA: Diagnosis not present

## 2021-11-11 DIAGNOSIS — R0609 Other forms of dyspnea: Secondary | ICD-10-CM | POA: Diagnosis not present

## 2021-11-11 DIAGNOSIS — R5381 Other malaise: Secondary | ICD-10-CM | POA: Diagnosis not present

## 2021-11-11 DIAGNOSIS — M061 Adult-onset Still's disease: Secondary | ICD-10-CM | POA: Diagnosis not present

## 2021-11-13 ENCOUNTER — Encounter: Payer: Self-pay | Admitting: Physician Assistant

## 2021-11-13 DIAGNOSIS — Z79899 Other long term (current) drug therapy: Secondary | ICD-10-CM

## 2021-11-13 DIAGNOSIS — M130 Polyarthritis, unspecified: Secondary | ICD-10-CM

## 2021-12-06 DIAGNOSIS — Z79899 Other long term (current) drug therapy: Secondary | ICD-10-CM | POA: Diagnosis not present

## 2021-12-06 DIAGNOSIS — M130 Polyarthritis, unspecified: Secondary | ICD-10-CM | POA: Diagnosis not present

## 2021-12-09 LAB — METHOTREXATE LEVEL: Methotrexate: <0.2 umol/L

## 2021-12-09 LAB — BASIC METABOLIC PANEL WITH GFR
BUN: 16 mg/dL (ref 7–25)
CO2: 27 mmol/L (ref 20–32)
Calcium: 9.4 mg/dL (ref 8.6–10.3)
Chloride: 104 mmol/L (ref 98–110)
Creat: 0.86 mg/dL (ref 0.60–1.24)
Glucose, Bld: 97 mg/dL (ref 65–139)
Potassium: 4.2 mmol/L (ref 3.5–5.3)
Sodium: 142 mmol/L (ref 135–146)
eGFR: 120 mL/min/{1.73_m2} (ref >=60)

## 2021-12-09 LAB — CBC WITH DIFFERENTIAL/PLATELET
Absolute Monocytes: 566 cells/uL (ref 200–950)
Basophils Absolute: 20 cells/uL (ref 0–200)
Basophils Relative: 0.3 %
Eosinophils Absolute: 117 cells/uL (ref 15–500)
Eosinophils Relative: 1.8 %
HCT: 44.7 % (ref 38.5–50.0)
Hemoglobin: 15 g/dL (ref 13.2–17.1)
Lymphs Abs: 2041 cells/uL (ref 850–3900)
MCH: 31.1 pg (ref 27.0–33.0)
MCHC: 33.6 g/dL (ref 32.0–36.0)
MCV: 92.5 fL (ref 80.0–100.0)
MPV: 11 fL (ref 7.5–12.5)
Monocytes Relative: 8.7 %
Neutro Abs: 3757 cells/uL (ref 1500–7800)
Neutrophils Relative %: 57.8 %
Platelets: 281 10*3/uL (ref 140–400)
RBC: 4.83 10*6/uL (ref 4.20–5.80)
RDW: 13 % (ref 11.0–15.0)
Total Lymphocyte: 31.4 %
WBC: 6.5 10*3/uL (ref 3.8–10.8)

## 2021-12-09 LAB — HEPATIC FUNCTION PANEL
AG Ratio: 1.8 (calc) (ref 1.0–2.5)
ALT: 62 U/L — ABNORMAL HIGH (ref 9–46)
AST: 35 U/L (ref 10–40)
Albumin: 4.6 g/dL (ref 3.6–5.1)
Alkaline phosphatase (APISO): 69 U/L (ref 36–130)
Bilirubin, Direct: 0.1 mg/dL (ref 0.0–0.2)
Globulin: 2.5 g/dL (calc) (ref 1.9–3.7)
Indirect Bilirubin: 0.6 mg/dL (calc) (ref 0.2–1.2)
Total Bilirubin: 0.7 mg/dL (ref 0.2–1.2)
Total Protein: 7.1 g/dL (ref 6.1–8.1)

## 2021-12-10 NOTE — Progress Notes (Signed)
Hemoglobin looks good.  ?Kidney and glucose look good.  ?Liver enzyme down some and stable ?Methotrexate level low.  ?Kidney, liver, glucose looks good.  ? ?Ok to send to rheumatology.

## 2022-01-03 ENCOUNTER — Other Ambulatory Visit: Payer: Self-pay | Admitting: Physician Assistant

## 2022-01-03 DIAGNOSIS — M061 Adult-onset Still's disease: Secondary | ICD-10-CM

## 2022-02-03 DIAGNOSIS — H5213 Myopia, bilateral: Secondary | ICD-10-CM | POA: Diagnosis not present

## 2022-03-13 DIAGNOSIS — H5213 Myopia, bilateral: Secondary | ICD-10-CM | POA: Diagnosis not present

## 2022-03-21 ENCOUNTER — Other Ambulatory Visit: Payer: Self-pay | Admitting: Physician Assistant

## 2022-03-21 ENCOUNTER — Encounter: Payer: Self-pay | Admitting: Physician Assistant

## 2022-03-21 DIAGNOSIS — M061 Adult-onset Still's disease: Secondary | ICD-10-CM

## 2022-03-21 DIAGNOSIS — S73191S Other sprain of right hip, sequela: Secondary | ICD-10-CM

## 2022-03-21 MED ORDER — FLUOXETINE HCL 20 MG PO CAPS: 20.0000 mg | ORAL_CAPSULE | Freq: Every day | ORAL | 1 refills | Status: DC

## 2022-04-06 ENCOUNTER — Other Ambulatory Visit: Payer: Self-pay | Admitting: Physician Assistant

## 2022-04-06 DIAGNOSIS — M061 Adult-onset Still's disease: Secondary | ICD-10-CM

## 2022-04-14 ENCOUNTER — Ambulatory Visit: Payer: Medicaid Other | Admitting: Physician Assistant

## 2022-04-23 ENCOUNTER — Encounter: Payer: Self-pay | Admitting: Physician Assistant

## 2022-04-23 ENCOUNTER — Ambulatory Visit: Payer: Medicare Other | Admitting: Physician Assistant

## 2022-04-23 VITALS — BP 123/62 | HR 70 | Wt 241.4 lb

## 2022-04-23 DIAGNOSIS — F3342 Major depressive disorder, recurrent, in full remission: Secondary | ICD-10-CM | POA: Diagnosis not present

## 2022-04-23 DIAGNOSIS — M130 Polyarthritis, unspecified: Secondary | ICD-10-CM

## 2022-04-23 DIAGNOSIS — M061 Adult-onset Still's disease: Secondary | ICD-10-CM | POA: Diagnosis not present

## 2022-04-23 DIAGNOSIS — M238X1 Other internal derangements of right knee: Secondary | ICD-10-CM

## 2022-04-23 DIAGNOSIS — G894 Chronic pain syndrome: Secondary | ICD-10-CM

## 2022-04-23 DIAGNOSIS — Z23 Encounter for immunization: Secondary | ICD-10-CM

## 2022-04-23 DIAGNOSIS — S73191S Other sprain of right hip, sequela: Secondary | ICD-10-CM

## 2022-04-23 DIAGNOSIS — K219 Gastro-esophageal reflux disease without esophagitis: Secondary | ICD-10-CM | POA: Diagnosis not present

## 2022-04-23 MED ORDER — METHOTREXATE 2.5 MG PO TABS: 20.0000 mg | ORAL_TABLET | ORAL | 5 refills | Status: DC

## 2022-04-23 MED ORDER — TRAMADOL HCL 50 MG PO TABS: ORAL_TABLET | ORAL | 0 refills | Status: AC

## 2022-04-23 MED ORDER — FLUOXETINE HCL 20 MG PO CAPS: 20.0000 mg | ORAL_CAPSULE | Freq: Every day | ORAL | 1 refills | Status: DC

## 2022-04-23 MED ORDER — FAMOTIDINE 20 MG PO TABS: 20.0000 mg | ORAL_TABLET | Freq: Two times a day (BID) | ORAL | 3 refills | Status: DC

## 2022-04-23 MED ORDER — NAPROXEN 500 MG PO TABS: 500.0000 mg | ORAL_TABLET | Freq: Two times a day (BID) | ORAL | 3 refills | Status: AC

## 2022-04-23 NOTE — Progress Notes (Signed)
Established Patient Office Visit  Subjective   Patient ID: Raymond Owens, male    DOB: 11/13/1990  Age: 31 y.o. MRN: [id number]  Chief Complaint  Patient presents with   Medication Refill    HPI Pt is a 31 yo male with adult onset stills disease and polyarthritis who presents to the clinic for follow up.   He does see rheumatology. He is off prednisone which is great news. He is about 75 percent better and managed on Ilaris injections, metotrexate, naproxen, and tramadol. He finds his pain is worse the week before his ilaris injection.   He does need refills on GERD and Mood medications. He is doing well. No SI/HC. He is working some when needed. He is trying to stay active at home.   .. Active Ambulatory Problems    Diagnosis Date Noted   Ringworm 06/29/2016   Hypertriglyceridemia 06/30/2016   Chronic heel pain, left 05/20/2018   Sepsis ([place]) 07/06/2019   FUO (fever of unknown origin) 07/07/2019   Headache 07/07/2019   Rash 07/07/2019   Acute respiratory failure with hypoxemia ([place]) 07/08/2019   Abnormal liver function    Aseptic meningitis    Leukocytosis    Multiple joint pain 07/20/2019   Pneumonia of both lungs due to infectious organism 07/20/2019   Vision loss, bilateral 07/25/2019   Myalgia 07/25/2019   Polyarthritis 07/25/2019   Adult-onset Still's disease ([place]) 08/03/2019   Double vision 08/05/2019   Weakness 08/05/2019   Adjustment disorder with depressed mood 08/05/2019   Acute midline low back pain without sciatica 08/05/2019   Acute midline thoracic back pain 10/14/2019   Ankle weakness 04/02/2020   Chronic pain of both ankles 04/02/2020   Chronic dyspnea 06/01/2020   Pain of left calf 06/01/2020   Chronic pain syndrome 09/14/2020   Labral tear of right hip joint 09/18/2020   Shortness of breath on exertion 09/18/2020   Crepitus of joint of right knee 08/16/2021   Non-restorative sleep 08/27/2021   Gastroesophageal reflux disease without esophagitis  04/23/2022   Resolved Ambulatory Problems    Diagnosis Date Noted   Bacterial meningitis 07/08/2019   Past Medical History:  Diagnosis Date   Hyperlipidemia    Still's disease ([place])      ROS See HPI.    Objective:     BP 123/62   Pulse 70   Wt 241 lb 6.4 oz (109.5 kg)   SpO2 98%   BMI 33.67 kg/m  BP Readings from Last 3 Encounters:  04/23/22 123/62  08/27/21 127/68  07/03/21 119/73   Wt Readings from Last 3 Encounters:  04/23/22 241 lb 6.4 oz (109.5 kg)  08/27/21 236 lb (107 kg)  07/03/21 236 lb (107 kg)    .[name]    04/23/2022   10:53 AM 03/08/2021   11:20 AM 06/01/2020    2:17 PM 03/30/2020   11:26 AM 07/25/2019   11:21 AM  Depression screen PHQ 2/9  Decreased Interest 0 1 3 1  0  Down, Depressed, Hopeless 0 1 3 1  0  PHQ - 2 Score 0 2 6 2  0  Altered sleeping  1 3 2    Tired, decreased energy  1 3 3    Change in appetite  1 3 3    Feeling bad or failure about yourself   1 3 1    Trouble concentrating  0 2 0   Moving slowly or fidgety/restless  0 2 2   Suicidal thoughts  0 1 0   PHQ-9 Score  6 23 13    Difficult doing work/chores  Somewhat difficult Very difficult Somewhat difficult    .    03/08/2021   11:20 AM 06/01/2020    2:17 PM 03/30/2020   11:27 AM 05/25/2019    8:55 AM  GAD 7 : Generalized Anxiety Score  Nervous, Anxious, on Edge 0 2 2 0  Control/stop worrying 0 3 2 0  Worry too much - different things 1 3 2  0  Trouble relaxing 1 3 1  0  Restless 0 2 2 0  Easily annoyed or irritable 0 3 3 0  Afraid - awful might happen 1 3 2  0  Total GAD 7 Score 3 19 14  0  Anxiety Difficulty Not difficult at all Very difficult Somewhat difficult Not difficult at all      Physical Exam Constitutional:      Appearance: Normal appearance.  HENT:     Head: Normocephalic.  Cardiovascular:     Rate and Rhythm: Normal rate.  Pulmonary:     Effort: Pulmonary effort is normal.  Musculoskeletal:     Right lower leg: No edema.     Left lower leg: No edema.   Neurological:     General: No focal deficit present.     Mental Status: He is alert.  Psychiatric:        Mood and Affect: Mood normal.         Assessment & Plan:  05/27/2019 Elliot was seen today for medication refill.  Diagnoses and all orders for this visit:  Polyarthritis -     traMADol (ULTRAM) 50 MG tablet; Take 2 tablets in the morning and 2 tablets in the evening for pain. Maximum 6 tabs per day. -     methotrexate (RHEUMATREX) 2.5 MG tablet; Take 8 tablets (20 mg total) by mouth once a week. -     naproxen (NAPROSYN) 500 MG tablet; Take 1 tablet (500 mg total) by mouth 2 (two) times daily with a meal.  Tear of right acetabular labrum, sequela  Adult-onset Still's disease (HCC) -     traMADol (ULTRAM) 50 MG tablet; Take 2 tablets in the morning and 2 tablets in the evening for pain. Maximum 6 tabs per day. -     methotrexate (RHEUMATREX) 2.5 MG tablet; Take 8 tablets (20 mg total) by mouth once a week. -     naproxen (NAPROSYN) 500 MG tablet; Take 1 tablet (500 mg total) by mouth 2 (two) times daily with a meal. -     famotidine (PEPCID) 20 MG tablet; Take 1 tablet (20 mg total) by mouth in the morning and at bedtime.  Crepitus of joint of right knee  Chronic pain syndrome -     traMADol (ULTRAM) 50 MG tablet; Take 2 tablets in the morning and 2 tablets in the evening for pain. Maximum 6 tabs per day. -     methotrexate (RHEUMATREX) 2.5 MG tablet; Take 8 tablets (20 mg total) by mouth once a week. -     naproxen (NAPROSYN) 500 MG tablet; Take 1 tablet (500 mg total) by mouth 2 (two) times daily with a meal.  Gastroesophageal reflux disease without esophagitis -     famotidine (PEPCID) 20 MG tablet; Take 1 tablet (20 mg total) by mouth in the morning and at bedtime.  Recurrent major depressive disorder, in full remission (HCC) -     FLUoxetine (PROZAC) 20 MG capsule; Take 1 capsule (20 mg total) by mouth daily.  Need for influenza vaccination -  Flu Vaccine QUAD 59mo+IM  (Fluarix, Fluzone & Alfiuria Quad PF)   PHQ/GAD numbers much better Refilled prozac Pepcid refilled for GERD Chronic pain- Pain contract signed .[name] KitchenPDMP not reviewed this encounter. No concerns Increased tramadol to 2 more tablets in evening if needed Follow up in 3 months Refilled naproxen/metotrexate Discussed patellar strap for right knee pain and follow up with sports medicine Pt is immunocompromised and agreed to the flu shot today   Tandy Gaw, PA-C

## 2022-05-09 ENCOUNTER — Telehealth: Payer: Self-pay | Admitting: Neurology

## 2022-05-09 NOTE — Telephone Encounter (Signed)
Tramadol approved by insurance valid 05/08/2022-11/04/2022.

## 2022-05-14 DIAGNOSIS — Z7952 Long term (current) use of systemic steroids: Secondary | ICD-10-CM | POA: Diagnosis not present

## 2022-05-14 DIAGNOSIS — R5382 Chronic fatigue, unspecified: Secondary | ICD-10-CM | POA: Diagnosis not present

## 2022-05-14 DIAGNOSIS — Z79899 Other long term (current) drug therapy: Secondary | ICD-10-CM | POA: Diagnosis not present

## 2022-05-14 DIAGNOSIS — R5381 Other malaise: Secondary | ICD-10-CM | POA: Diagnosis not present

## 2022-05-14 DIAGNOSIS — M0820 Juvenile rheumatoid arthritis with systemic onset, unspecified site: Secondary | ICD-10-CM | POA: Diagnosis not present

## 2022-06-24 ENCOUNTER — Encounter: Payer: Self-pay | Admitting: Physician Assistant

## 2022-06-24 ENCOUNTER — Ambulatory Visit: Payer: Medicare Other | Admitting: Physician Assistant

## 2022-06-24 VITALS — BP 124/70 | HR 77 | Ht 71.0 in | Wt 242.0 lb

## 2022-06-24 DIAGNOSIS — T887XXA Unspecified adverse effect of drug or medicament, initial encounter: Secondary | ICD-10-CM

## 2022-06-24 DIAGNOSIS — F430 Acute stress reaction: Secondary | ICD-10-CM

## 2022-06-24 DIAGNOSIS — Z79899 Other long term (current) drug therapy: Secondary | ICD-10-CM | POA: Diagnosis not present

## 2022-06-24 DIAGNOSIS — N529 Male erectile dysfunction, unspecified: Secondary | ICD-10-CM

## 2022-06-24 DIAGNOSIS — N522 Drug-induced erectile dysfunction: Secondary | ICD-10-CM | POA: Insufficient documentation

## 2022-06-24 DIAGNOSIS — F439 Reaction to severe stress, unspecified: Secondary | ICD-10-CM

## 2022-06-24 DIAGNOSIS — M061 Adult-onset Still's disease: Secondary | ICD-10-CM

## 2022-06-24 MED ORDER — SILDENAFIL CITRATE 20 MG PO TABS: 20.0000 mg | ORAL_TABLET | ORAL | 11 refills | Status: AC | PRN

## 2022-06-24 NOTE — Progress Notes (Unsigned)
Established Patient Office Visit  Subjective   Patient ID: Raymond Owens, male    DOB: 22-Oct-1990  Age: 31 y.o. MRN: [id number]  Chief Complaint  Patient presents with   Follow-up    HPI Pt is a 31 yo male with Adult onset Stills disease with chronic pain and MDD, GAD who presents to the clinic for follow up.   Pt was doing ok but him and his wife are having problems. She sent him a text "that she does not love him anymore". He is upset by this. He does love her. He does think that maybe his ED from medications have hurt his relationship. He can get an erection but not fully ejaculate. No SI/HC. He would like help today.   He has noticed more pain in multiple joints. He does not have an upcoming Rheumatology appt.    .. Active Ambulatory Problems    Diagnosis Date Noted   Ringworm 06/29/2016   Hypertriglyceridemia 06/30/2016   Chronic heel pain, left 05/20/2018   Sepsis ([place]) 07/06/2019   FUO (fever of unknown origin) 07/07/2019   Headache 07/07/2019   Rash 07/07/2019   Acute respiratory failure with hypoxemia ([place]) 07/08/2019   Abnormal liver function    Aseptic meningitis    Leukocytosis    Multiple joint pain 07/20/2019   Pneumonia of both lungs due to infectious organism 07/20/2019   Vision loss, bilateral 07/25/2019   Myalgia 07/25/2019   Polyarthritis 07/25/2019   Adult-onset Still's disease ([place]) 08/03/2019   Double vision 08/05/2019   Weakness 08/05/2019   Adjustment disorder with depressed mood 08/05/2019   Acute midline low back pain without sciatica 08/05/2019   Acute midline thoracic back pain 10/14/2019   Ankle weakness 04/02/2020   Chronic pain of both ankles 04/02/2020   Chronic dyspnea 06/01/2020   Pain of left calf 06/01/2020   Chronic pain syndrome 09/14/2020   Labral tear of right hip joint 09/18/2020   Shortness of breath on exertion 09/18/2020   Crepitus of joint of right knee 08/16/2021   Non-restorative sleep 08/27/2021   Gastroesophageal  reflux disease without esophagitis 04/23/2022   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   Stress at home 06/24/2022   Acute reaction to situational stress 06/24/2022   Resolved Ambulatory Problems    Diagnosis Date Noted   Bacterial meningitis 07/08/2019   Past Medical History:  Diagnosis Date   Hyperlipidemia    Still's disease ([place])     ROS See HPI.    Objective:     BP 124/70   Pulse 77   Ht _0  (1.803 m)   Wt 242 lb (109.8 kg)   SpO2 100%   BMI 33.75 kg/m  BP Readings from Last 3 Encounters:  06/24/22 124/70  04/23/22 123/62  08/27/21 127/68   Wt Readings from Last 3 Encounters:  06/24/22 242 lb (109.8 kg)  04/23/22 241 lb 6.4 oz (109.5 kg)  08/27/21 236 lb (107 kg)    .[name]    06/24/2022    9:09 AM 04/23/2022   10:53 AM 03/08/2021   11:20 AM 06/01/2020    2:17 PM 03/30/2020   11:26 AM  Depression screen PHQ 2/9  Decreased Interest 1 0 _1 Down, Depressed, Hopeless 1 0 _2 PHQ - 2 Score 2 0 _3 Altered sleeping _4 Tired, decreased energy _5 Change in appetite _6 Feeling bad or  failure about yourself  _0 Trouble concentrating 1  0 2 0  Moving slowly or fidgety/restless 1  0 2 2  Suicidal thoughts 1  0 1 0  PHQ-9 Score _1 Difficult doing work/chores Somewhat difficult  Somewhat difficult Very difficult Somewhat difficult   ..    06/24/2022    9:09 AM 03/08/2021   11:20 AM 06/01/2020    2:17 PM 03/30/2020   11:27 AM  GAD 7 : Generalized Anxiety Score  Nervous, Anxious, on Edge 1 0 2 2  Control/stop worrying 1 0 3 2  Worry too much - different things _2 Trouble relaxing _3 Restless 1 0 2 2  Easily annoyed or irritable 1 0 3 3  Afraid - awful might happen _4 Total GAD 7 Score _5 Anxiety Difficulty Somewhat difficult Not difficult at all Very difficult Somewhat difficult      Physical Exam Constitutional:      Appearance: Normal appearance.  HENT:     Head:  Normocephalic.  Cardiovascular:     Rate and Rhythm: Normal rate and regular rhythm.  Pulmonary:     Effort: Pulmonary effort is normal.     Breath sounds: Normal breath sounds.  Musculoskeletal:     Right lower leg: No edema.     Left lower leg: No edema.  Neurological:     General: No focal deficit present.     Mental Status: He is alert and oriented to person, place, and time.  Psychiatric:        Mood and Affect: Mood normal.          Assessment & Plan:  [name] KitchenMarland KitchenRobyn was seen today for follow-up.  Diagnoses and all orders for this visit:  Acute reaction to situational stress -     Testosterone Total,Free,Bio, Males -     B12 and Folate Panel -     VITAMIN D 25 Hydroxy (Vit-D Deficiency, Fractures) -     CBC w/Diff/Platelet -     TSH -     Sed Rate (ESR) -     C-reactive protein -     Methotrexate level -     BASIC METABOLIC PANEL WITH GFR -     Hepatic function panel  Stress at home -     Testosterone Total,Free,Bio, Males -     B12 and Folate Panel -     VITAMIN D 25 Hydroxy (Vit-D Deficiency, Fractures) -     CBC w/Diff/Platelet -     TSH -     Sed Rate (ESR) -     C-reactive protein -     Methotrexate level -     BASIC METABOLIC PANEL WITH GFR -     Hepatic function panel  Drug-induced erectile dysfunction -     Testosterone Total,Free,Bio, Males -     TSH  Medication management -     Testosterone Total,Free,Bio, Males -     B12 and Folate Panel -     VITAMIN D 25 Hydroxy (Vit-D Deficiency, Fractures) -     CBC w/Diff/Platelet -     TSH -     Sed Rate (ESR) -     C-reactive protein -     Methotrexate level -     BASIC METABOLIC PANEL WITH GFR -     Hepatic function panel  Adult-onset Still's disease (  [place]) -     Testosterone Total,Free,Bio, Males -     B12 and Folate Panel -     VITAMIN D 25 Hydroxy (Vit-D Deficiency, Fractures) -     CBC w/Diff/Platelet -     TSH -     Sed Rate (ESR) -     C-reactive protein -     Methotrexate level -      BASIC METABOLIC PANEL WITH GFR -     Hepatic function panel  Medication side effects  Male erectile disorder -     sildenafil (REVATIO) 20 MG tablet; Take 1-5 tablets (20-100 mg total) by mouth as needed.   Vitals look good.  He does need fasting labs  Will test testosterone Discussed ED as side effect to medication Leave medication the same Start sildenafil 1-5 tablets as needed for ED Discussed counseling individual and marriage Follow up in 3 months or sooner if needed  Spent 36 minutes with patient discussing medications, labs, treatment plan.    Iran Planas, PA-C

## 2022-06-25 ENCOUNTER — Encounter: Payer: Self-pay | Admitting: Physician Assistant

## 2022-06-25 NOTE — Progress Notes (Signed)
[name],   Kidney, liver, glucose looks good.  Sed rate GREAT.  Thyroid wonderful.  WBC and hemoglobin look good.  Vitamin D and B12 look great.  ALT up a little from last check-your medications can effect this but I would also avoid alcohol Testosterone is good!    Please send all labs to rheumatology once methotrexate level has resulted.

## 2022-06-26 LAB — CBC WITH DIFFERENTIAL/PLATELET
Absolute Monocytes: 660 cells/uL (ref 200–950)
Basophils Absolute: 30 cells/uL (ref 0–200)
Basophils Relative: 0.5 %
Eosinophils Absolute: 192 cells/uL (ref 15–500)
Eosinophils Relative: 3.2 %
HCT: 45.3 % (ref 38.5–50.0)
Hemoglobin: 15.5 g/dL (ref 13.2–17.1)
Lymphs Abs: 2142 cells/uL (ref 850–3900)
MCH: 32 pg (ref 27.0–33.0)
MCHC: 34.2 g/dL (ref 32.0–36.0)
MCV: 93.6 fL (ref 80.0–100.0)
MPV: 10.9 fL (ref 7.5–12.5)
Monocytes Relative: 11 %
Neutro Abs: 2976 cells/uL (ref 1500–7800)
Neutrophils Relative %: 49.6 %
Platelets: 273 10*3/uL (ref 140–400)
RBC: 4.84 10*6/uL (ref 4.20–5.80)
RDW: 13.5 % (ref 11.0–15.0)
Total Lymphocyte: 35.7 %
WBC: 6 10*3/uL (ref 3.8–10.8)

## 2022-06-26 LAB — TESTOSTERONE TOTAL,FREE,BIO, MALES
Albumin: 4.6 g/dL (ref 3.6–5.1)
Sex Hormone Binding: 34 nmol/L (ref 10–50)
Testosterone, Bioavailable: 195.5 ng/dL (ref 110.0–575.0)
Testosterone, Free: 93.1 pg/mL (ref 46.0–224.0)
Testosterone: 674 ng/dL (ref 250–827)

## 2022-06-26 LAB — HEPATIC FUNCTION PANEL
AG Ratio: 1.7 (calc) (ref 1.0–2.5)
ALT: 66 U/L — ABNORMAL HIGH (ref 9–46)
AST: 31 U/L (ref 10–40)
Albumin: 4.6 g/dL (ref 3.6–5.1)
Alkaline phosphatase (APISO): 64 U/L (ref 36–130)
Bilirubin, Direct: 0.1 mg/dL (ref 0.0–0.2)
Globulin: 2.7 g/dL (calc) (ref 1.9–3.7)
Indirect Bilirubin: 0.7 mg/dL (calc) (ref 0.2–1.2)
Total Bilirubin: 0.8 mg/dL (ref 0.2–1.2)
Total Protein: 7.3 g/dL (ref 6.1–8.1)

## 2022-06-26 LAB — BASIC METABOLIC PANEL WITH GFR
BUN: 14 mg/dL (ref 7–25)
CO2: 26 mmol/L (ref 20–32)
Calcium: 9.3 mg/dL (ref 8.6–10.3)
Chloride: 104 mmol/L (ref 98–110)
Creat: 0.76 mg/dL (ref 0.60–1.26)
Glucose, Bld: 88 mg/dL (ref 65–99)
Potassium: 3.9 mmol/L (ref 3.5–5.3)
Sodium: 139 mmol/L (ref 135–146)
eGFR: 124 mL/min/{1.73_m2} (ref >=60)

## 2022-06-26 LAB — METHOTREXATE LEVEL: Methotrexate: <0.2 umol/L

## 2022-06-26 LAB — TSH: TSH: 1.43 mIU/L (ref 0.40–4.50)

## 2022-06-26 LAB — C-REACTIVE PROTEIN: CRP: 0.8 mg/L (ref <8.0)

## 2022-06-26 LAB — B12 AND FOLATE PANEL
Folate: 16.7 ng/mL
Vitamin B-12: 512 pg/mL (ref 200–1100)

## 2022-06-26 LAB — VITAMIN D 25 HYDROXY (VIT D DEFICIENCY, FRACTURES): Vit D, 25-Hydroxy: 50 ng/mL (ref 30–100)

## 2022-06-26 LAB — SEDIMENTATION RATE: Sed Rate: 2 mm/h (ref 0–15)

## 2022-09-19 ENCOUNTER — Ambulatory Visit: Payer: Medicaid Other | Admitting: Physician Assistant

## 2022-09-19 ENCOUNTER — Encounter: Payer: Self-pay | Admitting: Physician Assistant

## 2022-09-19 VITALS — BP 132/75 | HR 76 | Ht 71.0 in | Wt 259.0 lb

## 2022-09-19 DIAGNOSIS — F339 Major depressive disorder, recurrent, unspecified: Secondary | ICD-10-CM | POA: Diagnosis not present

## 2022-09-19 DIAGNOSIS — G478 Other sleep disorders: Secondary | ICD-10-CM

## 2022-09-19 DIAGNOSIS — R0683 Snoring: Secondary | ICD-10-CM | POA: Diagnosis not present

## 2022-09-19 DIAGNOSIS — R5383 Other fatigue: Secondary | ICD-10-CM | POA: Diagnosis not present

## 2022-09-19 DIAGNOSIS — R635 Abnormal weight gain: Secondary | ICD-10-CM

## 2022-09-19 DIAGNOSIS — Z6836 Body mass index (BMI) 36.0-36.9, adult: Secondary | ICD-10-CM | POA: Diagnosis not present

## 2022-09-19 NOTE — Patient Instructions (Signed)
Will get sleep study.  Will get labs.   Fatigue If you have fatigue, you feel tired all the time and have a lack of energy or a lack of motivation. Fatigue may make it difficult to start or complete tasks because of exhaustion. Occasional or mild fatigue is often a normal response to activity or life. However, long-term (chronic) or extreme fatigue may be a symptom of a medical condition such as: Depression. Not having enough red blood cells or hemoglobin in the blood (anemia). A problem with a small gland located in the lower front part of the neck (thyroid disorder). Rheumatologic conditions. These are problems related to the body's defense system (immune system). Infections, especially certain viral infections. Fatigue can also lead to negative health outcomes over time. Follow these instructions at home: Medicines Take over-the-counter and prescription medicines only as told by your health care provider. Take a multivitamin if told by your health care provider. Do not use herbal or dietary supplements unless they are approved by your health care provider. Eating and drinking  Avoid heavy meals in the evening. Eat a well-balanced diet, which includes lean proteins, whole grains, plenty of fruits and vegetables, and low-fat dairy products. Avoid eating or drinking too many products with caffeine in them. Avoid alcohol. Drink enough fluid to keep your urine pale yellow. Activity  Exercise regularly, as told by your health care provider. Use or practice techniques to help you relax, such as yoga, tai chi, meditation, or massage therapy. Lifestyle Change situations that cause you stress. Try to keep your work and personal schedules in balance. Do not use recreational or illegal drugs. General instructions Monitor your fatigue for any changes. Go to bed and get up at the same time every day. Avoid fatigue by pacing yourself during the day and getting enough sleep at night. Maintain a  healthy weight. Contact a health care provider if: Your fatigue does not get better. You have a fever. You suddenly lose or gain weight. You have headaches. You have trouble falling asleep or sleeping through the night. You feel angry, guilty, anxious, or sad. You have swelling in your legs or another part of your body. Get help right away if: You feel confused, feel like you might faint, or faint. Your vision is blurry or you have a severe headache. You have severe pain in your abdomen, your back, or the area between your waist and hips (pelvis). You have chest pain, shortness of breath, or an irregular or fast heartbeat. You are unable to urinate, or you urinate less than normal. You have abnormal bleeding from the rectum, nose, lungs, nipples, or, if you are male, the vagina. You vomit blood. You have thoughts about hurting yourself or others. These symptoms may be an emergency. Get help right away. Call 911. Do not wait to see if the symptoms will go away. Do not drive yourself to the hospital. Get help right away if you feel like you may hurt yourself or others, or have thoughts about taking your own life. Go to your nearest emergency room or: Call 911. Call the [facility] at (720) 699-8374 or 988. This is open 24 hours a day. Text the Crisis Text Line at 726-172-8934. Summary If you have fatigue, you feel tired all the time and have a lack of energy or a lack of motivation. Fatigue may make it difficult to start or complete tasks because of exhaustion. Long-term (chronic) or extreme fatigue may be a symptom of a medical  condition. Exercise regularly, as told by your health care provider. Change situations that cause you stress. Try to keep your work and personal schedules in balance. This information is not intended to replace advice given to you by your health care provider. Make sure you discuss any questions you have with your health care  provider. Document Revised: 06/03/2021 Document Reviewed: 06/03/2021 Elsevier Patient Education  [place].

## 2022-09-19 NOTE — Progress Notes (Unsigned)
Acute Office Visit  Subjective:     Patient ID: Raymond Owens, male    DOB: 08/15/1991, 32 y.o.   MRN: [id number]  Chief Complaint  Patient presents with   Fatigue    HPI Patient is in today to discuss fatigue. He is just very tired all the time. He does not wake up feeling rested. He does snore. He feels like he needs a nap after very little physical activity. He is going through a seperation with wife but does not feel like it is depression. No SI/HC. He is finally off prednisone completely that he had been on for at least 2 years. His pain is controlled but still present daily.   .. Active Ambulatory Problems    Diagnosis Date Noted   Ringworm 06/29/2016   Hypertriglyceridemia 06/30/2016   Chronic heel pain, left 05/20/2018   Sepsis ([place]) 07/06/2019   FUO (fever of unknown origin) 07/07/2019   Headache 07/07/2019   Rash 07/07/2019   Acute respiratory failure with hypoxemia ([place]) 07/08/2019   Abnormal liver function    Aseptic meningitis    Leukocytosis    Multiple joint pain 07/20/2019   Pneumonia of both lungs due to infectious organism 07/20/2019   Vision loss, bilateral 07/25/2019   Myalgia 07/25/2019   Polyarthritis 07/25/2019   Adult-onset Still's disease ([place]) 08/03/2019   Double vision 08/05/2019   Weakness 08/05/2019   Adjustment disorder with depressed mood 08/05/2019   Acute midline low back pain without sciatica 08/05/2019   Acute midline thoracic back pain 10/14/2019   Ankle weakness 04/02/2020   Chronic pain of both ankles 04/02/2020   Chronic dyspnea 06/01/2020   Pain of left calf 06/01/2020   Chronic pain syndrome 09/14/2020   Labral tear of right hip joint 09/18/2020   Shortness of breath on exertion 09/18/2020   Crepitus of joint of right knee 08/16/2021   Non-restorative sleep 08/27/2021   Gastroesophageal reflux disease without esophagitis 04/23/2022   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   Stress at home 06/24/2022   Acute reaction to  situational stress 06/24/2022   Class 2 severe obesity due to excess calories with serious comorbidity and body mass index (BMI) of 36.0 to 36.9 in adult [facility]) 09/19/2022   Weight gain 09/19/2022   Snoring 09/19/2022   No energy 09/19/2022   Resolved Ambulatory Problems    Diagnosis Date Noted   Bacterial meningitis 07/08/2019   Past Medical History:  Diagnosis Date   Hyperlipidemia    Still's disease ([place])      ROS  See HPI.     Objective:    BP 132/75   Pulse 76   Ht 5\' 11"  (1.803 m)   Wt 259 lb (117.5 kg)   SpO2 97%   BMI 36.12 kg/m  BP Readings from Last 3 Encounters:  09/19/22 132/75  06/24/22 124/70  04/23/22 123/62   Wt Readings from Last 3 Encounters:  09/19/22 259 lb (117.5 kg)  06/24/22 242 lb (109.8 kg)  04/23/22 241 lb 6.4 oz (109.5 kg)    ..    09/19/2022   10:30 AM 06/24/2022    9:09 AM 04/23/2022   10:53 AM 03/08/2021   11:20 AM 06/01/2020    2:17 PM  Depression screen PHQ 2/9  Decreased Interest 1 1 0 1 3  Down, Depressed, Hopeless 1 1 0 1 3  PHQ - 2 Score 2 2 0 2 6  Altered sleeping 2 2  1 3   Tired, decreased energy 2 2  1  3  Change in appetite 1 2  1 3   Feeling bad or failure about yourself  1 2  1 3   Trouble concentrating 1 1  0 2  Moving slowly or fidgety/restless 0 1  0 2  Suicidal thoughts 0 1  0 1  PHQ-9 Score 9 13  6 23   Difficult doing work/chores Somewhat difficult Somewhat difficult  Somewhat difficult Very difficult   ..    09/19/2022   10:30 AM 06/24/2022    9:09 AM 03/08/2021   11:20 AM 06/01/2020    2:17 PM  GAD 7 : Generalized Anxiety Score  Nervous, Anxious, on Edge 1 1 0 2  Control/stop worrying 1 1 0 3  Worry too much - different things 1 1 1 3   Trouble relaxing 1 1 1 3   Restless 1 1 0 2  Easily annoyed or irritable 1 1 0 3  Afraid - awful might happen 1 1 1 3   Total GAD 7 Score 7 7 3 19   Anxiety Difficulty Somewhat difficult Somewhat difficult Not difficult at all Very difficult      Physical  Exam Constitutional:      Appearance: Normal appearance. He is obese.  HENT:     Head: Normocephalic.  Cardiovascular:     Rate and Rhythm: Normal rate and regular rhythm.     Pulses: Normal pulses.     Heart sounds: Normal heart sounds.  Pulmonary:     Effort: Pulmonary effort is normal.     Breath sounds: Normal breath sounds.  Musculoskeletal:     Right lower leg: No edema.     Left lower leg: No edema.  Neurological:     General: No focal deficit present.     Mental Status: He is alert and oriented to person, place, and time.  Psychiatric:        Mood and Affect: Mood normal.          Assessment & Plan:  [name] KitchenMarland KitchenMarisol was seen today for fatigue.  Diagnoses and all orders for this visit:  No energy -     TSH -     CBC w/Diff/Platelet -     B12 and Folate Panel -     VITAMIN D 25 Hydroxy (Vit-D Deficiency, Fractures) -     Testosterone Total,Free,Bio, Males -     Sed Rate (ESR) -     BASIC METABOLIC PANEL WITH GFR -     Home sleep test  Snoring -     TSH -     CBC w/Diff/Platelet -     B12 and Folate Panel -     VITAMIN D 25 Hydroxy (Vit-D Deficiency, Fractures) -     Testosterone Total,Free,Bio, Males -     Sed Rate (ESR) -     BASIC METABOLIC PANEL WITH GFR -     Home sleep test  Non-restorative sleep -     TSH -     CBC w/Diff/Platelet -     B12 and Folate Panel -     VITAMIN D 25 Hydroxy (Vit-D Deficiency, Fractures) -     Testosterone Total,Free,Bio, Males -     Sed Rate (ESR) -     BASIC METABOLIC PANEL WITH GFR -     Home sleep test  Weight gain -     TSH -     CBC w/Diff/Platelet -     B12 and Folate Panel -     VITAMIN D 25 Hydroxy (Vit-D Deficiency,  Fractures) -     Testosterone Total,Free,Bio, Males -     Sed Rate (ESR) -     BASIC METABOLIC PANEL WITH GFR -     Home sleep test  Class 2 severe obesity due to excess calories with serious comorbidity and body mass index (BMI) of 36.0 to 36.9 in adult [facility]) -     Home sleep  test  Depression, recurrent (HCC)   STOP BANG was high risk Sleep study ordered Labs to look for any metabolic causes of fatigue Discussed could consider medication changes for mood to see if would help with fatigue He could have some adrenal fatigue coming off long term prednisone use  Follow up as needed or after Sleep study  Tandy Gaw, PA-C

## 2022-09-20 LAB — B12 AND FOLATE PANEL
Folate: >24 ng/mL
Vitamin B-12: 389 pg/mL (ref 200–1100)

## 2022-09-20 LAB — BASIC METABOLIC PANEL WITH GFR
BUN: 16 mg/dL (ref 7–25)
CO2: 27 mmol/L (ref 20–32)
Calcium: 9.3 mg/dL (ref 8.6–10.3)
Chloride: 104 mmol/L (ref 98–110)
Creat: 0.83 mg/dL (ref 0.60–1.26)
Glucose, Bld: 85 mg/dL (ref 65–99)
Potassium: 4.4 mmol/L (ref 3.5–5.3)
Sodium: 140 mmol/L (ref 135–146)
eGFR: 121 mL/min/{1.73_m2} (ref >=60)

## 2022-09-20 LAB — TESTOSTERONE TOTAL,FREE,BIO, MALES
Albumin: 4.5 g/dL (ref 3.6–5.1)
Sex Hormone Binding: 29 nmol/L (ref 10–50)
Testosterone, Bioavailable: 176.8 ng/dL (ref 110.0–575.0)
Testosterone, Free: 86 pg/mL (ref 46.0–224.0)
Testosterone: 561 ng/dL (ref 250–827)

## 2022-09-20 LAB — CBC WITH DIFFERENTIAL/PLATELET
Absolute Monocytes: 449 cells/uL (ref 200–950)
Basophils Absolute: 20 cells/uL (ref 0–200)
Basophils Relative: 0.4 %
Eosinophils Absolute: 117 cells/uL (ref 15–500)
Eosinophils Relative: 2.3 %
HCT: 45.4 % (ref 38.5–50.0)
Hemoglobin: 15.6 g/dL (ref 13.2–17.1)
Lymphs Abs: 2096 cells/uL (ref 850–3900)
MCH: 32.4 pg (ref 27.0–33.0)
MCHC: 34.4 g/dL (ref 32.0–36.0)
MCV: 94.4 fL (ref 80.0–100.0)
MPV: 10.8 fL (ref 7.5–12.5)
Monocytes Relative: 8.8 %
Neutro Abs: 2417 cells/uL (ref 1500–7800)
Neutrophils Relative %: 47.4 %
Platelets: 260 10*3/uL (ref 140–400)
RBC: 4.81 10*6/uL (ref 4.20–5.80)
RDW: 13.5 % (ref 11.0–15.0)
Total Lymphocyte: 41.1 %
WBC: 5.1 10*3/uL (ref 3.8–10.8)

## 2022-09-20 LAB — SEDIMENTATION RATE: Sed Rate: 2 mm/h (ref 0–15)

## 2022-09-20 LAB — VITAMIN D 25 HYDROXY (VIT D DEFICIENCY, FRACTURES): Vit D, 25-Hydroxy: 40 ng/mL (ref 30–100)

## 2022-09-20 LAB — TSH: TSH: 1.39 mIU/L (ref 0.40–4.50)

## 2022-09-22 ENCOUNTER — Encounter: Payer: Self-pay | Admitting: Physician Assistant

## 2022-09-22 NOTE — Progress Notes (Signed)
[name],   Kidney, glucose and electrolytes look great.  Low sed rate of inflammation.  Testosterone looks great.  Vitamin D normal range.  Normal hemoglobin and WBC.  Thyroid is perfect.  B12 on the low side of normal. Start b12 1041mcg daily.   No overt lab signs to contribute to fatigue.

## 2022-10-15 DIAGNOSIS — Z635 Disruption of family by separation and divorce: Secondary | ICD-10-CM | POA: Diagnosis not present

## 2022-10-15 DIAGNOSIS — M0820 Juvenile rheumatoid arthritis with systemic onset, unspecified site: Secondary | ICD-10-CM | POA: Diagnosis not present

## 2022-10-15 DIAGNOSIS — R5381 Other malaise: Secondary | ICD-10-CM | POA: Diagnosis not present

## 2022-10-15 DIAGNOSIS — Z79899 Other long term (current) drug therapy: Secondary | ICD-10-CM | POA: Diagnosis not present

## 2022-10-15 DIAGNOSIS — M7918 Myalgia, other site: Secondary | ICD-10-CM | POA: Diagnosis not present

## 2022-10-15 DIAGNOSIS — R5382 Chronic fatigue, unspecified: Secondary | ICD-10-CM | POA: Diagnosis not present

## 2022-11-12 ENCOUNTER — Encounter: Payer: Self-pay | Admitting: Physician Assistant

## 2022-11-12 NOTE — Progress Notes (Unsigned)
   Acute Office Visit  Subjective:     Patient ID: Raymond Owens, male    DOB: Mar 03, 1991, 32 y.o.   MRN: [id number]  No chief complaint on file.   HPI Patient is in today for STD testing  ROS      Objective:    There were no vitals taken for this visit. {Vitals History (Optional):23777}  Physical Exam  No results found for any visits on 11/13/22.      Assessment & Plan:   Problem List Items Addressed This Visit   None   No orders of the defined types were placed in this encounter.   No follow-ups on file.  Owens Loffler, DO

## 2022-11-13 ENCOUNTER — Encounter: Payer: Self-pay | Admitting: Family Medicine

## 2022-11-13 ENCOUNTER — Ambulatory Visit: Payer: Medicare Other | Admitting: Family Medicine

## 2022-11-13 VITALS — BP 138/72 | HR 88 | Resp 20 | Ht 71.0 in | Wt 254.1 lb

## 2022-11-13 DIAGNOSIS — Z202 Contact with and (suspected) exposure to infections with a predominantly sexual mode of transmission: Secondary | ICD-10-CM | POA: Insufficient documentation

## 2022-11-13 DIAGNOSIS — Z7251 High risk heterosexual behavior: Secondary | ICD-10-CM | POA: Insufficient documentation

## 2022-11-13 MED ORDER — DOXYCYCLINE HYCLATE 100 MG PO TABS: 100.0000 mg | ORAL_TABLET | Freq: Two times a day (BID) | ORAL | 0 refills | Status: AC

## 2022-11-13 NOTE — Assessment & Plan Note (Signed)
Ordered STD panel including urine and blood work - Recommended condom use during sex.

## 2022-11-13 NOTE — Assessment & Plan Note (Signed)
-   Have gone ahead and treated for chlamydia with doxycycline - Did inform the patient that he is not to have any sexual intercourse until treatment is complete.

## 2022-11-14 ENCOUNTER — Other Ambulatory Visit: Payer: Self-pay | Admitting: Physician Assistant

## 2022-11-14 DIAGNOSIS — F3342 Major depressive disorder, recurrent, in full remission: Secondary | ICD-10-CM

## 2022-11-14 LAB — HEPATITIS PANEL, ACUTE
Hep A IgM: NONREACTIVE
Hep B C IgM: NONREACTIVE
Hepatitis B Surface Ag: NONREACTIVE
Hepatitis C Ab: NONREACTIVE

## 2022-11-14 LAB — RPR: RPR Ser Ql: NONREACTIVE

## 2022-11-14 LAB — HIV ANTIBODY (ROUTINE TESTING W REFLEX): HIV 1&2 Ab, 4th Generation: NONREACTIVE

## 2022-11-14 LAB — C. TRACHOMATIS/N. GONORRHOEAE RNA
C. trachomatis RNA, TMA: DETECTED — AB
N. gonorrhoeae RNA, TMA: NOT DETECTED

## 2022-11-19 ENCOUNTER — Other Ambulatory Visit: Payer: Self-pay | Admitting: Physician Assistant

## 2022-11-19 DIAGNOSIS — M061 Adult-onset Still's disease: Secondary | ICD-10-CM

## 2022-11-19 DIAGNOSIS — G894 Chronic pain syndrome: Secondary | ICD-10-CM

## 2022-11-19 DIAGNOSIS — M130 Polyarthritis, unspecified: Secondary | ICD-10-CM

## 2022-11-23 ENCOUNTER — Encounter: Payer: Self-pay | Admitting: Family Medicine

## 2022-12-10 ENCOUNTER — Other Ambulatory Visit: Payer: Self-pay | Admitting: Physician Assistant

## 2022-12-10 DIAGNOSIS — M130 Polyarthritis, unspecified: Secondary | ICD-10-CM

## 2022-12-10 DIAGNOSIS — M061 Adult-onset Still's disease: Secondary | ICD-10-CM

## 2022-12-10 DIAGNOSIS — G894 Chronic pain syndrome: Secondary | ICD-10-CM

## 2023-01-06 ENCOUNTER — Other Ambulatory Visit: Payer: Self-pay | Admitting: Physician Assistant

## 2023-01-06 DIAGNOSIS — M061 Adult-onset Still's disease: Secondary | ICD-10-CM

## 2023-01-06 DIAGNOSIS — M130 Polyarthritis, unspecified: Secondary | ICD-10-CM

## 2023-01-06 DIAGNOSIS — G894 Chronic pain syndrome: Secondary | ICD-10-CM

## 2023-04-13 ENCOUNTER — Ambulatory Visit: Payer: Medicare HMO | Admitting: Physician Assistant

## 2023-04-13 VITALS — BP 127/88 | HR 80 | Ht 71.0 in | Wt 261.0 lb

## 2023-04-13 DIAGNOSIS — E781 Pure hyperglyceridemia: Secondary | ICD-10-CM

## 2023-04-13 DIAGNOSIS — Z13220 Encounter for screening for lipoid disorders: Secondary | ICD-10-CM

## 2023-04-13 DIAGNOSIS — B081 Molluscum contagiosum: Secondary | ICD-10-CM | POA: Diagnosis not present

## 2023-04-13 DIAGNOSIS — K219 Gastro-esophageal reflux disease without esophagitis: Secondary | ICD-10-CM | POA: Diagnosis not present

## 2023-04-13 DIAGNOSIS — R1013 Epigastric pain: Secondary | ICD-10-CM | POA: Diagnosis not present

## 2023-04-13 DIAGNOSIS — M549 Dorsalgia, unspecified: Secondary | ICD-10-CM | POA: Insufficient documentation

## 2023-04-13 DIAGNOSIS — K2900 Acute gastritis without bleeding: Secondary | ICD-10-CM

## 2023-04-13 DIAGNOSIS — Z131 Encounter for screening for diabetes mellitus: Secondary | ICD-10-CM

## 2023-04-13 DIAGNOSIS — Z0000 Encounter for general adult medical examination without abnormal findings: Secondary | ICD-10-CM | POA: Diagnosis not present

## 2023-04-13 MED ORDER — HYOSCYAMINE SULFATE 0.125 MG SL SUBL
0.2500 mg | SUBLINGUAL_TABLET | Freq: Once | SUBLINGUAL | Status: AC
Start: 2023-04-13 — End: 2023-04-13
  Administered 2023-04-13: 0.25 mg via SUBLINGUAL

## 2023-04-13 MED ORDER — ALUM & MAG HYDROXIDE-SIMETH 200-200-20 MG/5ML PO SUSP
30.0000 mL | Freq: Once | ORAL | Status: AC
Start: 2023-04-13 — End: 2023-04-13
  Administered 2023-04-13: 30 mL via ORAL

## 2023-04-13 MED ORDER — DICYCLOMINE HCL 10 MG/5ML PO SOLN
10.0000 mg | Freq: Once | ORAL | Status: AC
Start: 2023-04-13 — End: 2023-04-13
  Administered 2023-04-13: 10 mg via ORAL

## 2023-04-13 MED ORDER — OMEPRAZOLE 40 MG PO CPDR
40.0000 mg | DELAYED_RELEASE_CAPSULE | Freq: Every day | ORAL | 2 refills | Status: DC
Start: 2023-04-13 — End: 2024-05-23

## 2023-04-13 NOTE — Patient Instructions (Signed)
Gastritis, Adult Gastritis is inflammation of the stomach. There are two kinds of gastritis: Acute gastritis. This kind develops suddenly. Chronic gastritis. This kind is much more common. It develops slowly and lasts for a long time. Gastritis happens when the lining of the stomach becomes weak or gets damaged. Without treatment, gastritis can lead to stomach bleeding and ulcers. What are the causes? This condition may be caused by: An infection. Drinking too much alcohol. Certain medicines. These include steroids, antibiotics, and some over-the-counter medicines, such as aspirin or ibuprofen. Having too much acid in the stomach. Having a disease of the stomach. Other causes may include: An allergic reaction. Some cancer treatments (radiation). Smoking cigarettes or the use of products that contain nicotine or tobacco. In some cases, the cause of this condition is not known. What increases the risk? Having a disease of the intestines. Having a disease in which the body's immune system attacks the body (autoimmune disease), such as Crohn's disease. Using aspirin or ibuprofen and other NSAIDs to treat other conditions, such as heart disease or chronic pain. Stress. What are the signs or symptoms? Symptoms of this condition include: Pain or a burning sensation in the upper abdomen. Nausea. Vomiting. An uncomfortable feeling of fullness after eating. Weight loss. Bad breath. Blood in your vomit or stool (feces). In some cases, there are no symptoms. How is this diagnosed? This condition may be diagnosed based on your medical history, a physical exam, and tests. Tests may include: Your medical history and a description of your symptoms. A physical exam. Tests. These can include: Blood tests. Stool tests. A test in which a thin, flexible instrument with a light and a camera is passed down the esophagus and into the stomach (upper endoscopy). A test in which a tissue sample is  removed to look at it under a microscope (biopsy). How is this treated? This condition may be treated with medicines. The medicines that are used vary depending on the cause of the gastritis. If the condition is caused by a bacterial infection, you may be given antibiotic medicines. If the condition is caused by too much acid in the stomach, you may be given medicines called H2 blockers, proton pump inhibitors, or antacids. Treatment may also involve stopping the use of certain medicines such as aspirin or ibuprofen and other NSAIDs. Follow these instructions at home: Medicines Take over-the-counter and prescription medicines only as told by your health care provider. If you were prescribed an antibiotic medicine, take it as told by your health care provider. Do not stop taking the antibiotic even if you start to feel better. Alcohol use Do not drink alcohol if: Your health care provider tells you not to drink. You are pregnant, may be pregnant, or are planning to become pregnant. If you drink alcohol: Limit your use to: 0-1 drink a day for women. 0-2 drinks a day for men. Know how much alcohol is in your drink. In the [place] one drink equals one 12 oz bottle of beer (355 mL), one 5 oz glass of wine (148 mL), or one 1 oz glass of hard liquor (44 mL). General instructions  Eat small, frequent meals instead of large meals. Avoid foods and drinks that make your symptoms worse. Talk with your health care provider about ways to manage stress, such as getting regular exercise or practicing deep breathing, meditation, or yoga. Do not use any products that contain nicotine or tobacco. These products include cigarettes, chewing tobacco, and vaping devices, such as e-cigarettes.  If you need help quitting, ask your health care provider. Drink enough fluid to keep your urine pale yellow. Keep all follow-up visits. This is important. Contact a health care provider if: Your symptoms get worse. Your  abdominal pain gets worse. Your symptoms return after treatment. You have a fever. Get help right away if: You vomit blood or a substance that looks like coffee grounds. You have black or dark red stools. You are unable to keep fluids down. These symptoms may represent a serious problem that is an emergency. Do not wait to see if the symptoms will go away. Get medical help right away. Call your local emergency services (911 in the [place] Do not drive yourself to the hospital. Summary Gastritis is inflammation of the lining of the stomach that can occur suddenly (acute) or develop slowly over time (chronic). This condition is diagnosed with a medical history, a physical exam, or tests. This condition may be treated with medicines to treat infection or medicines to reduce the amount of acid in your stomach. Follow your health care provider's instructions about taking medicines, making changes to your diet, and knowing when to call for help. This information is not intended to replace advice given to you by your health care provider. Make sure you discuss any questions you have with your health care provider. Document Revised: 12/15/2020 Document Reviewed: 12/15/2020 Elsevier Patient Education  2024 [employer]   Health Maintenance, Male Adopting a healthy lifestyle and getting preventive care are important in promoting health and wellness. Ask your health care provider about: The right schedule for you to have regular tests and exams. Things you can do on your own to prevent diseases and keep yourself healthy. What should I know about diet, weight, and exercise? Eat a healthy diet  Eat a diet that includes plenty of vegetables, fruits, low-fat dairy products, and lean protein. Do not eat a lot of foods that are high in solid fats, added sugars, or sodium. Maintain a healthy weight Body mass index (BMI) is a measurement that can be used to identify possible weight problems. It estimates  body fat based on height and weight. Your health care provider can help determine your BMI and help you achieve or maintain a healthy weight. Get regular exercise Get regular exercise. This is one of the most important things you can do for your health. Most adults should: Exercise for at least 150 minutes each week. The exercise should increase your heart rate and make you sweat (moderate-intensity exercise). Do strengthening exercises at least twice a week. This is in addition to the moderate-intensity exercise. Spend less time sitting. Even light physical activity can be beneficial. Watch cholesterol and blood lipids Have your blood tested for lipids and cholesterol at 31 years of age, then have this test every 5 years. You may need to have your cholesterol levels checked more often if: Your lipid or cholesterol levels are high. You are older than 31 years of age. You are at high risk for heart disease. What should I know about cancer screening? Many types of cancers can be detected early and may often be prevented. Depending on your health history and family history, you may need to have cancer screening at various ages. This may include screening for: Colorectal cancer. Prostate cancer. Skin cancer. Lung cancer. What should I know about heart disease, diabetes, and high blood pressure? Blood pressure and heart disease High blood pressure causes heart disease and increases the risk of stroke. This is  more likely to develop in people who have high blood pressure readings or are overweight. Talk with your health care provider about your target blood pressure readings. Have your blood pressure checked: Every 3-5 years if you are 59-62 years of age. Every year if you are 75 years old or older. If you are between the ages of 9 and 5 and are a current or former smoker, ask your health care provider if you should have a one-time screening for abdominal aortic aneurysm (AAA). Diabetes Have  regular diabetes screenings. This checks your fasting blood sugar level. Have the screening done: Once every three years after age 3 if you are at a normal weight and have a low risk for diabetes. More often and at a younger age if you are overweight or have a high risk for diabetes. What should I know about preventing infection? Hepatitis B If you have a higher risk for hepatitis B, you should be screened for this virus. Talk with your health care provider to find out if you are at risk for hepatitis B infection. Hepatitis C Blood testing is recommended for: Everyone born from [birth year] through 1964. Anyone with known risk factors for hepatitis C. Sexually transmitted infections (STIs) You should be screened each year for STIs, including gonorrhea and chlamydia, if: You are sexually active and are younger than 31 years of age. You are older than 31 years of age and your health care provider tells you that you are at risk for this type of infection. Your sexual activity has changed since you were last screened, and you are at increased risk for chlamydia or gonorrhea. Ask your health care provider if you are at risk. Ask your health care provider about whether you are at high risk for HIV. Your health care provider may recommend a prescription medicine to help prevent HIV infection. If you choose to take medicine to prevent HIV, you should first get tested for HIV. You should then be tested every 3 months for as long as you are taking the medicine. Follow these instructions at home: Alcohol use Do not drink alcohol if your health care provider tells you not to drink. If you drink alcohol: Limit how much you have to 0-2 drinks a day. Know how much alcohol is in your drink. In the [place] one drink equals one 12 oz bottle of beer (355 mL), one 5 oz glass of wine (148 mL), or one 1 oz glass of hard liquor (44 mL). Lifestyle Do not use any products that contain nicotine or tobacco. These products  include cigarettes, chewing tobacco, and vaping devices, such as e-cigarettes. If you need help quitting, ask your health care provider. Do not use street drugs. Do not share needles. Ask your health care provider for help if you need support or information about quitting drugs. General instructions Schedule regular health, dental, and eye exams. Stay current with your vaccines. Tell your health care provider if: You often feel depressed. You have ever been abused or do not feel safe at home. Summary Adopting a healthy lifestyle and getting preventive care are important in promoting health and wellness. Follow your health care provider's instructions about healthy diet, exercising, and getting tested or screened for diseases. Follow your health care provider's instructions on monitoring your cholesterol and blood pressure. This information is not intended to replace advice given to you by your health care provider. Make sure you discuss any questions you have with your health care provider. Document Revised: 12/31/2020 Document  Reviewed: 12/31/2020 Elsevier Patient Education  2024 [employer].

## 2023-04-13 NOTE — Progress Notes (Unsigned)
Complete physical exam  Patient: Raymond Owens   DOB: 10/11/1990   32 y.o. Male  MRN: [id number]  Subjective:    Chief Complaint  Patient presents with   Annual Exam    Rt side shoulder pain rt neck pain     Ti Pelkey is a 32 y.o. male who presents today for a complete physical exam. He reports consuming a {diet types:17450} diet. {types:19826} He generally feels {DESC; WELL/FAIRLY WELL/POORLY:18703}. He reports sleeping {DESC; WELL/FAIRLY WELL/POORLY:18703}. He {does/does not:200015} have additional problems to discuss today.   Epigastric pain- omeprazole  Blood in stool none Diarrhea  Month   Right shoulder  Most recent fall risk assessment:    11/13/2022   10:13 AM  Fall Risk   Falls in the past year? 0  Number falls in past yr: 0  Injury with Fall? 0  Risk for fall due to : No Fall Risks  Follow up Falls evaluation completed     Most recent depression screenings:    11/13/2022   10:13 AM 09/19/2022   10:30 AM  PHQ 2/9 Scores  PHQ - 2 Score 2 2  PHQ- 9 Score 4 9    {VISON DENTAL STD PSA (Optional):27386}  {History (Optional):23778}  Patient Care Team: Nolene Ebbs as PCP - General (Family Medicine) Dianne Dun, MD as Attending Physician (Rheumatology)   Outpatient Medications Prior to Visit  Medication Sig   Canakinumab (ILARIS) 150 MG/ML SOLN Inject 300 mg into the skin every 30 (thirty) days. (Patient taking differently: Inject 300 mg into the skin every 21 ( twenty-one) days.)   cholecalciferol (VITAMIN D3) 25 MCG (1000 UNIT) tablet Take 2,000 Units by mouth daily.   famotidine (PEPCID) 20 MG tablet Take 1 tablet (20 mg total) by mouth in the morning and at bedtime.   FLUoxetine (PROZAC) 20 MG capsule TAKE 1 CAPSULE BY MOUTH EVERY DAY   Folic Acid (FOLATE PO) Take 600 mcg by mouth daily.   methotrexate (RHEUMATREX) 2.5 MG tablet TAKE 8 TABLETS (20 MG TOTAL) BY MOUTH ONCE A WEEK.   milk thistle 175 MG tablet Take 175 mg by mouth daily.    Multiple Vitamins-Minerals (MULTIVITAMIN WITH MINERALS) tablet Take 1 tablet by mouth daily. With iron   naproxen (NAPROSYN) 500 MG tablet Take 1 tablet (500 mg total) by mouth 2 (two) times daily with a meal.   Omega-3 Fatty Acids (FISH OIL) 1000 MG CAPS Take 1,000 mg by mouth daily.   sildenafil (REVATIO) 20 MG tablet Take 1-5 tablets (20-100 mg total) by mouth as needed.   traMADol (ULTRAM) 50 MG tablet Take 2 tablets in the morning and 2 tablets in the evening for pain. Maximum 6 tabs per day.   zinc gluconate 50 MG tablet Take 50 mg by mouth daily.   No facility-administered medications prior to visit.    ROS        Objective:     BP (!) 142/74   Pulse 70   Ht 5\' 11"  (1.803 m)   Wt 261 lb (118.4 kg)   SpO2 99%   BMI 36.40 kg/m  {Vitals History (Optional):23777}  Physical Exam   No results found for any visits on 04/13/23. {Show previous labs (optional):23779}    Assessment & Plan:    Routine Health Maintenance and Physical Exam  Immunization History  Administered Date(s) Administered   Influenza,inj,Quad PF,6+ Mos 10/10/2015, 06/27/2016, 05/19/2017, 05/20/2018, 05/25/2019, 06/11/2020, 07/03/2021, 04/23/2022   Moderna Sars-Covid-2 Vaccination 11/19/2019, 12/20/2019   Tdap 10/10/2015,  03/08/2021    Health Maintenance  Topic Date Due   Medicare Annual Wellness (AWV)  Never done   COVID-19 Vaccine (3 - Moderna risk series) 04/29/2023 (Originally 01/17/2020)   INFLUENZA VACCINE  11/23/2023 (Originally 03/26/2023)   DTaP/Tdap/Td (3 - Td or Tdap) 03/09/2031   Hepatitis C Screening  Completed   HIV Screening  Completed   HPV VACCINES  Aged Out    Discussed health benefits of physical activity, and encouraged him to engage in regular exercise appropriate for his age and condition.  Problem List Items Addressed This Visit   None Visit Diagnoses     Routine physical examination    -  Primary   Relevant Orders   TSH   CBC with Differential/Platelet   Lipid panel    CMP14+EGFR   Screening for lipid disorders       Relevant Orders   Lipid panel   Screening for diabetes mellitus       Relevant Orders   CMP14+EGFR      No follow-ups on file.     Tandy Gaw, PA-C

## 2023-04-14 ENCOUNTER — Other Ambulatory Visit: Payer: Self-pay | Admitting: Physician Assistant

## 2023-04-14 ENCOUNTER — Encounter: Payer: Self-pay | Admitting: Physician Assistant

## 2023-04-14 DIAGNOSIS — K219 Gastro-esophageal reflux disease without esophagitis: Secondary | ICD-10-CM

## 2023-04-14 DIAGNOSIS — R1013 Epigastric pain: Secondary | ICD-10-CM

## 2023-04-14 DIAGNOSIS — B081 Molluscum contagiosum: Secondary | ICD-10-CM | POA: Insufficient documentation

## 2023-04-14 DIAGNOSIS — K2900 Acute gastritis without bleeding: Secondary | ICD-10-CM | POA: Insufficient documentation

## 2023-04-14 LAB — CMP14+EGFR
ALT: 91 IU/L — ABNORMAL HIGH (ref 0–44)
AST: 44 IU/L — ABNORMAL HIGH (ref 0–40)
Albumin: 4.5 g/dL (ref 4.1–5.1)
Alkaline Phosphatase: 99 IU/L (ref 44–121)
BUN/Creatinine Ratio: 19 (ref 9–20)
BUN: 16 mg/dL (ref 6–20)
Bilirubin Total: 0.3 mg/dL (ref 0.0–1.2)
CO2: 22 mmol/L (ref 20–29)
Calcium: 9.5 mg/dL (ref 8.7–10.2)
Chloride: 105 mmol/L (ref 96–106)
Creatinine, Ser: 0.83 mg/dL (ref 0.76–1.27)
Globulin, Total: 2.4 g/dL (ref 1.5–4.5)
Glucose: 92 mg/dL (ref 70–99)
Potassium: 4.4 mmol/L (ref 3.5–5.2)
Sodium: 140 mmol/L (ref 134–144)
Total Protein: 6.9 g/dL (ref 6.0–8.5)
eGFR: 120 mL/min/{1.73_m2} (ref >59)

## 2023-04-14 LAB — CBC WITH DIFFERENTIAL/PLATELET
Basophils Absolute: 0 10*3/uL (ref 0.0–0.2)
Basos: 1 %
EOS (ABSOLUTE): 0.2 10*3/uL (ref 0.0–0.4)
Eos: 4 %
Hematocrit: 45.9 % (ref 37.5–51.0)
Hemoglobin: 15.5 g/dL (ref 13.0–17.7)
Immature Grans (Abs): 0 10*3/uL (ref 0.0–0.1)
Immature Granulocytes: 0 %
Lymphocytes Absolute: 1.6 10*3/uL (ref 0.7–3.1)
Lymphs: 30 %
MCH: 31.4 pg (ref 26.6–33.0)
MCHC: 33.8 g/dL (ref 31.5–35.7)
MCV: 93 fL (ref 79–97)
Monocytes Absolute: 0.8 10*3/uL (ref 0.1–0.9)
Monocytes: 15 %
Neutrophils Absolute: 2.7 10*3/uL (ref 1.4–7.0)
Neutrophils: 50 %
Platelets: 262 10*3/uL (ref 150–450)
RBC: 4.94 x10E6/uL (ref 4.14–5.80)
RDW: 13.1 % (ref 11.6–15.4)
WBC: 5.3 10*3/uL (ref 3.4–10.8)

## 2023-04-14 LAB — LIPID PANEL
Chol/HDL Ratio: 6.4 ratio — ABNORMAL HIGH (ref 0.0–5.0)
Cholesterol, Total: 197 mg/dL (ref 100–199)
HDL: 31 mg/dL — ABNORMAL LOW (ref >39)
LDL Chol Calc (NIH): 97 mg/dL (ref 0–99)
Triglycerides: 408 mg/dL — ABNORMAL HIGH (ref 0–149)
VLDL Cholesterol Cal: 69 mg/dL — ABNORMAL HIGH (ref 5–40)

## 2023-04-14 LAB — TSH: TSH: 2.95 u[IU]/mL (ref 0.450–4.500)

## 2023-04-14 LAB — LIPASE: Lipase: 21 U/L (ref 13–78)

## 2023-04-14 MED ORDER — IMIQUIMOD 5 % EX CREA
TOPICAL_CREAM | CUTANEOUS | 0 refills | Status: AC
Start: 2023-04-15 — End: ?

## 2023-04-14 NOTE — Progress Notes (Signed)
[name],   Remind me which provider to send your labs too?   Kidney function looks good.  Liver enzymes are mildly elevated.  TG are elevated. Are you taking fish oil? I would like to start a medication fenofibrate that can help lower TG if diet and exercise and fish oil are not enough. Thoughts?  TSH normal range.  Normal lipase.  Normal WBC and hemoglobin.

## 2023-04-15 ENCOUNTER — Encounter: Payer: Self-pay | Admitting: Physician Assistant

## 2023-04-15 DIAGNOSIS — R5382 Chronic fatigue, unspecified: Secondary | ICD-10-CM | POA: Diagnosis not present

## 2023-04-15 DIAGNOSIS — M0820 Juvenile rheumatoid arthritis with systemic onset, unspecified site: Secondary | ICD-10-CM | POA: Diagnosis not present

## 2023-04-15 DIAGNOSIS — F439 Reaction to severe stress, unspecified: Secondary | ICD-10-CM | POA: Diagnosis not present

## 2023-04-15 DIAGNOSIS — R7989 Other specified abnormal findings of blood chemistry: Secondary | ICD-10-CM | POA: Diagnosis not present

## 2023-04-15 DIAGNOSIS — Z79899 Other long term (current) drug therapy: Secondary | ICD-10-CM | POA: Diagnosis not present

## 2023-04-15 DIAGNOSIS — R5381 Other malaise: Secondary | ICD-10-CM | POA: Diagnosis not present

## 2023-04-15 MED ORDER — FENOFIBRATE 160 MG PO TABS
160.0000 mg | ORAL_TABLET | Freq: Every day | ORAL | 3 refills | Status: AC
Start: 2023-04-15 — End: ?

## 2023-04-15 NOTE — Addendum Note (Signed)
Addended by: Jomarie Longs on: 04/15/2023 10:23 AM   Modules accepted: Orders

## 2023-04-15 NOTE — Telephone Encounter (Signed)
Thank you so much

## 2023-05-15 ENCOUNTER — Encounter: Payer: Self-pay | Admitting: Physician Assistant

## 2023-05-15 ENCOUNTER — Ambulatory Visit (INDEPENDENT_AMBULATORY_CARE_PROVIDER_SITE_OTHER): Payer: Medicare HMO | Admitting: Physician Assistant

## 2023-05-15 VITALS — BP 113/59 | HR 94 | Ht 71.0 in | Wt 260.8 lb

## 2023-05-15 DIAGNOSIS — G478 Other sleep disorders: Secondary | ICD-10-CM

## 2023-05-15 DIAGNOSIS — Z6836 Body mass index (BMI) 36.0-36.9, adult: Secondary | ICD-10-CM | POA: Diagnosis not present

## 2023-05-15 DIAGNOSIS — R0683 Snoring: Secondary | ICD-10-CM | POA: Diagnosis not present

## 2023-05-15 DIAGNOSIS — B081 Molluscum contagiosum: Secondary | ICD-10-CM

## 2023-05-15 DIAGNOSIS — E66812 Obesity, class 2: Secondary | ICD-10-CM

## 2023-05-15 DIAGNOSIS — R5382 Chronic fatigue, unspecified: Secondary | ICD-10-CM | POA: Diagnosis not present

## 2023-05-15 DIAGNOSIS — K2900 Acute gastritis without bleeding: Secondary | ICD-10-CM | POA: Diagnosis not present

## 2023-05-15 NOTE — Progress Notes (Signed)
Established Patient Office Visit  Subjective   Patient ID: Raymond Owens, male    DOB: June 20, 1991  Age: 32 y.o. MRN: [id number]  Chief Complaint  Patient presents with   Medical Management of Chronic Issues    GERD skin problem    HPI  Patient is a 31 year old male who is in today for follow-up on GERD, epigastric pain, chronic fatigue, and molluscum rash.   Patient reports that his epigastric pain and GERD/reflux have mostly subsided over the past month after beginning omeprazole in addition to his famotidine. Only complains of reflux occasionally after eating spicy foods.   Patient is following up on molluscum rash. He was given imiquimod at his last visit and reports using it 3 times daily for the last 4 weeks. Reports that his bumps appeared dried out but are still present and have not spread. Wishes to discuss options for further treatment today.  Patient also complains of chronic fatigue. 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 Patient reports to needing more caffeine and frequently napping during the day to have more energy. States that he sleeps well at night and has no problem falling or staying asleep. Patient does report snoring and has not had a sleep study done.    .. 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   Double vision 08/05/2019   Weakness 08/05/2019   Adjustment disorder with depressed mood  08/05/2019   Acute midline low back pain without sciatica 08/05/2019   Acute midline thoracic back pain 10/14/2019   Ankle weakness 04/02/2020   Chronic pain of both ankles 04/02/2020   Chronic dyspnea 06/01/2020   Pain of left calf 06/01/2020   Chronic pain syndrome 09/14/2020   Labral tear of right hip joint 09/18/2020   Shortness of breath on exertion 09/18/2020   Crepitus of joint of right knee 08/16/2021   Non-restorative sleep 08/27/2021   Gastroesophageal reflux disease without esophagitis 04/23/2022   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   Stress at home 06/24/2022   Acute reaction to situational stress 06/24/2022   Class 2 severe obesity due to excess calories with serious comorbidity and body mass index (BMI) of 36.0 to 36.9 in adult [facility]) 09/19/2022   Weight gain 09/19/2022   Snoring 09/19/2022   No energy 09/19/2022   High risk heterosexual behavior 11/13/2022   Exposure to chlamydia 11/13/2022   Upper back pain on right side 04/13/2023   Epigastric pain 04/14/2023   Acute gastritis without hemorrhage 04/14/2023   Molluscum contagiosum 04/14/2023   Chronic fatigue 05/15/2023   Resolved Ambulatory Problems    Diagnosis Date Noted   Bacterial meningitis 07/08/2019   Past Medical History:  Diagnosis Date   Hyperlipidemia    Still's disease (HCC)      ROS   See HPI.  Objective:     BP (!) 113/59 (BP Location: Left Arm, Patient Position: Sitting, Cuff Size: Large)  Pulse 94   Ht 5\' 11"  (1.803 m)   Wt 118.3 kg   SpO2 97%   BMI 36.37 kg/m  BP Readings from Last 3 Encounters:  05/15/23 (!) 113/59  04/13/23 127/88  11/13/22 138/72   Wt Readings from Last 3 Encounters:  05/15/23 260 lb 12 oz (118.3 kg)  04/13/23 261 lb (118.4 kg)  11/13/22 254 lb 1.9 oz (115.3 kg)      Physical Exam Constitutional:      Appearance: Normal appearance. He is obese.  HENT:     Head: Normocephalic.  Cardiovascular:     Rate and Rhythm: Normal rate and  regular rhythm.  Pulmonary:     Effort: Pulmonary effort is normal.     Breath sounds: Normal breath sounds.  Skin:    Comments: Molluscum of suprapubic area  Neurological:     General: No focal deficit present.     Mental Status: He is alert and oriented to person, place, and time.    Cryotherapy Procedure Note  Pre-operative Diagnosis: mollscum  Post-operative Diagnosis: same  Locations: suprapubic area  Indications: irritated  Procedure Details  History of allergy to iodine: no. Pacemaker? no.  Patient informed of risks (permanent scarring, infection, light or dark discoloration, bleeding, infection, weakness, numbness and recurrence of the lesion) and benefits of the procedure and verbal informed consent obtained.  The areas are treated with liquid nitrogen therapy, frozen until ice ball extended 1 mm beyond lesion, allowed to thaw, and treated again. The patient tolerated procedure well.  The patient was instructed on post-op care, warned that there may be blister formation, redness and pain. Recommend OTC analgesia as needed for pain.  Condition: Stable  Complications: none.  Plan: 1. Instructed to keep the area dry and covered for 24-48h and clean thereafter. 2. Warning signs of infection were reviewed.   3. Recommended that the patient use OTC acetaminophen as needed for pain.        Assessment & Plan:  [name] KitchenMarland KitchenDelano "[name]" was seen today for medical management of chronic issues.  Diagnoses and all orders for this visit:  Molluscum contagiosum  Chronic fatigue -     Home sleep test; Future  Non-restorative sleep -     Home sleep test; Future  Snoring -     Home sleep test; Future  Class 2 severe obesity due to excess calories with serious comorbidity and body mass index (BMI) of 36.0 to 36.9 in adult [facility]) -     Home sleep test; Future  Acute gastritis without hemorrhage, unspecified gastritis type   Labs UTD Advised to continue on omeprazole and  pepcid for GERD Continue GERD diet to reduce acid reflux Ordered sleep study to assess sleep quality and for sleep apnea due to reports of snoring and increased fatigue Liquid nitrogen trial done in office today on a section of molluscum rash. If rash improves and patient tolerates, patient will follow-up to receive liquid nitrogen on remainder of rash

## 2023-05-17 ENCOUNTER — Other Ambulatory Visit: Payer: Self-pay | Admitting: Physician Assistant

## 2023-05-17 DIAGNOSIS — F3342 Major depressive disorder, recurrent, in full remission: Secondary | ICD-10-CM

## 2023-05-17 DIAGNOSIS — M061 Adult-onset Still's disease: Secondary | ICD-10-CM

## 2023-05-17 DIAGNOSIS — K219 Gastro-esophageal reflux disease without esophagitis: Secondary | ICD-10-CM

## 2023-06-23 ENCOUNTER — Encounter: Payer: Self-pay | Admitting: Physician Assistant

## 2023-06-23 ENCOUNTER — Ambulatory Visit (INDEPENDENT_AMBULATORY_CARE_PROVIDER_SITE_OTHER): Payer: Medicare HMO | Admitting: Physician Assistant

## 2023-06-23 VITALS — BP 110/67 | HR 81 | Ht 71.0 in | Wt 260.0 lb

## 2023-06-23 DIAGNOSIS — M79662 Pain in left lower leg: Secondary | ICD-10-CM | POA: Diagnosis not present

## 2023-06-23 DIAGNOSIS — J40 Bronchitis, not specified as acute or chronic: Secondary | ICD-10-CM

## 2023-06-23 DIAGNOSIS — J329 Chronic sinusitis, unspecified: Secondary | ICD-10-CM | POA: Diagnosis not present

## 2023-06-23 MED ORDER — PROMETHAZINE-DM 6.25-15 MG/5ML PO SYRP
5.0000 mL | ORAL_SOLUTION | Freq: Four times a day (QID) | ORAL | 0 refills | Status: DC | PRN
Start: 2023-06-23 — End: 2023-10-27

## 2023-06-23 MED ORDER — AZITHROMYCIN 250 MG PO TABS
ORAL_TABLET | ORAL | 0 refills | Status: DC
Start: 2023-06-23 — End: 2023-07-21

## 2023-06-23 MED ORDER — DICLOFENAC SODIUM 1 % EX GEL
4.0000 g | Freq: Four times a day (QID) | CUTANEOUS | 1 refills | Status: AC
Start: 2023-06-23 — End: ?

## 2023-06-23 NOTE — Progress Notes (Signed)
Acute Office Visit  Subjective:     Patient ID: Raymond Owens, male    DOB: 11/16/1990, 32 y.o.   MRN: [id number]  Chief Complaint  Patient presents with   Cough    HPI Patient is in today for cough, congestion, chest tightness for the last 2 weeks. He is concerned about pneumonia. No sick contacts. No fever, chills, nausea, vomiting. His cough is productive with green sputum. 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 He is using OTC mucinex D and decongestant with minimal relief.   .. 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   Double vision 08/05/2019   Weakness 08/05/2019   Adjustment disorder with depressed mood 08/05/2019   Acute midline low back pain without sciatica 08/05/2019   Acute midline thoracic back pain 10/14/2019   Ankle weakness 04/02/2020   Chronic pain of both ankles 04/02/2020   Chronic dyspnea 06/01/2020   Pain of left calf 06/01/2020   Chronic pain syndrome 09/14/2020   Labral tear of right hip joint 09/18/2020   Shortness of breath on exertion 09/18/2020   Crepitus of joint of right knee 08/16/2021   Non-restorative sleep 08/27/2021   Gastroesophageal reflux disease without esophagitis 04/23/2022   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   Stress at home 06/24/2022   Acute reaction to situational stress 06/24/2022   Class 2 severe obesity due to excess calories  with serious comorbidity and body mass index (BMI) of 36.0 to 36.9 in adult [facility]) 09/19/2022   Weight gain 09/19/2022   Snoring 09/19/2022   No energy 09/19/2022   High risk heterosexual behavior 11/13/2022   Exposure to chlamydia 11/13/2022   Upper back pain on right side 04/13/2023   Epigastric pain 04/14/2023   Acute gastritis without hemorrhage 04/14/2023   Molluscum contagiosum 04/14/2023   Chronic fatigue 05/15/2023   Resolved Ambulatory Problems    Diagnosis Date Noted   Bacterial meningitis 07/08/2019   Past Medical History:  Diagnosis Date   Hyperlipidemia    Still's disease (HCC)      ROS  See HPI.     Objective:    BP 110/67   Pulse 81   Ht 5\' 11"  (1.803 m)   Wt 260 lb (117.9 kg)   SpO2 99%   BMI 36.26 kg/m  BP Readings from Last 3 Encounters:  06/23/23 110/67  05/15/23 (!) 113/59  04/13/23 127/88   Wt Readings from Last 3 Encounters:  06/23/23 260 lb (117.9 kg)  05/15/23 260 lb 12 oz (118.3 kg)  04/13/23 261 lb (118.4 kg)      Physical Exam Constitutional:      Appearance: Normal appearance. He is obese.  HENT:     Head: Normocephalic.     Right Ear: Tympanic membrane, ear canal and external ear normal. There is no impacted cerumen.     Left Ear: Tympanic membrane, ear canal and external ear normal. There is  no impacted cerumen.     Nose: Nose normal. No congestion or rhinorrhea.     Mouth/Throat:     Mouth: Mucous membranes are moist.     Pharynx: Posterior oropharyngeal erythema present. No oropharyngeal exudate.  Eyes:     Conjunctiva/sclera: Conjunctivae normal.  Cardiovascular:     Rate and Rhythm: Normal rate and regular rhythm.     Heart sounds: Normal heart sounds.  Pulmonary:     Effort: Pulmonary effort is normal.     Breath sounds: Normal breath sounds.  Musculoskeletal:     Cervical back: Normal range of motion and neck supple. No tenderness.     Right lower leg: No edema.     Left lower leg: No edema.     Comments: Left  calf pain to palpation at base into achilles tendon. No swelling, warmth, redness.   Lymphadenopathy:     Cervical: No cervical adenopathy.  Neurological:     General: No focal deficit present.     Mental Status: He is alert and oriented to person, place, and time.  Psychiatric:        Mood and Affect: Mood normal.          Assessment & Plan:  [name] KitchenMarland KitchenSebastien "[name]" was seen today for cough.  Diagnoses and all orders for this visit:  Sinobronchitis -     azithromycin (ZITHROMAX Z-PAK) 250 MG tablet; Take 2 tablets (500 mg) on  Day 1,  followed by 1 tablet (250 mg) once daily on Days 2 through 5. -     promethazine-dextromethorphan (PROMETHAZINE-DM) 6.25-15 MG/5ML syrup; Take 5 mLs by mouth 4 (four) times daily as needed for cough.  Pain of left calf -     diclofenac Sodium (VOLTAREN) 1 % GEL; Apply 4 g topically 4 (four) times daily. To affected joint.   Reassured no abnormal lung sounds to suggest pneumonia Treated for bronchitis with zpak and cough syrup Rest and hydrate Follow up as needed or if symptoms persist or worsen  Start magnesium 400mg  for calf pain Voltaren gel to use as needed at base of calf and into achilles tendon Exercises for achilles and calf Follow up as needed if symptoms persist or worsen  Tandy Gaw, PA-C

## 2023-06-23 NOTE — Patient Instructions (Addendum)
Start magnesium 400mg  at bedtime Use voltaren gel over achilles tendon and calf Start exercises  Zpak for infection-follow up if not improving.    Achilles Tendinitis Rehab Ask your health care provider which exercises are safe for you. Do exercises exactly as told by your provider and adjust them as directed. It is normal to feel mild stretching, pulling, tightness, or discomfort as you do these exercises. Stop right away if you feel sudden pain or your pain gets worse. Do not begin these exercises until told by your provider. Stretching and range-of-motion exercises These exercises warm up your muscles and joints. They improve the movement and flexibility of your ankle. These exercises also help to relieve pain. Standing wall calf stretch with straight knee  Stand with your hands against a wall. Extend your left / right leg behind you, and bend your front knee slightly. Keep both of your heels on the floor. Point the toes of your back foot slightly inward. Keeping your heels on the floor and your back knee straight, shift your weight toward the wall. Do not let your back arch. You should feel a gentle stretch in your upper calf. Hold this position for __________ seconds. Repeat __________ times. Complete this exercise __________ times a day. Standing wall calf stretch with bent knee  Stand with your hands against a wall. Extend your left / right leg behind you, and bend your front knee slightly. Keep both of your heels on the floor. Point the toes of your back foot slightly inward. Keeping your heels on the floor, bend your back knee slightly. You should feel a gentle stretch deep in your lower calf near your heel. Hold this position for __________ seconds. Repeat __________ times. Complete this exercise __________ times a day. Strengthening exercises These exercises build strength and control of your ankle. Endurance is the ability to use your muscles for a long time, even after they  get tired. Plantar flexion with band In this exercise, you push your toes downward, away from you, with an exercise band providing resistance. Sit on the floor with your left / right leg extended. You may put a pillow under your calf to give your foot more room to move. Loop a rubber exercise band or tube around the ball of your left / right foot. The ball of your foot is on the walking surface, right under your toes. The band or tube should be slightly tense when your foot is relaxed. If the band or tube slips, you can put on your shoe or put a washcloth between the band and your foot to help it stay in place. Slowly point your toes downward, pushing them away from you (plantar flexion). Hold this position for __________ seconds. Slowly release the tension in the band or tube, controlling smoothly until your foot is back to the starting position. Repeat steps 1-5 with your left / right leg. Repeat __________ times. Complete this exercise __________ times a day. Eccentric heel drop  In this exercise, you stand and slowly raise your heel and then slowly lower it. This exercise lengthens the calf muscles (eccentric) while the foot bears weight. If this exercise is too easy, try doing it while wearing a backpack with weights in it. Stand on a step with the balls of your feet. The ball of your foot is on the walking surface, right under your toes. Do not put your heels on the step. For balance, rest your hands on the wall or on a railing. Rise  up onto the balls of your feet. Keeping your heels up, shift all of your weight to your left / right leg and pick up your other leg. Slowly lower your left / right leg so your heel drops below the level of the step. Put down your other foot before going back to the start position. If told by your provider, build up to: 3 sets of 15 repetitions while keeping your knees straight. 3 sets of 15 repetitions while keeping your knees slightly bent as far as told by  your provider. Repeat __________ times. Complete this exercise __________ times a day. Balance exercises These exercises improve or maintain your balance. Balance helps to prevent falls. Single leg stand If this exercise is too easy, you can try it with your eyes closed or while standing on a pillow. Without shoes, stand near a railing or in a doorframe. Hold on to the railing or doorframe as needed. Stand on your left / right foot. Keep your big toe down on the floor and try to keep your arch lifted. You should feel a stretch across the bottom of your foot and arch. Do not let your foot roll inward. Hold this position for __________ seconds. Repeat __________ times. Complete this exercise __________ times a day. This information is not intended to replace advice given to you by your health care provider. Make sure you discuss any questions you have with your health care provider. Document Revised: 05/20/2022 Document Reviewed: 05/20/2022 Elsevier Patient Education  2024 [employer]  Acute Bronchitis, Adult  Acute bronchitis is sudden inflammation of the main airways (bronchi) that come off the windpipe (trachea) in the lungs. The swelling causes the airways to get smaller and make more mucus than normal. This can make it hard to breathe and can cause coughing or noisy breathing (wheezing). Acute bronchitis may last several weeks. The cough may last longer. Allergies, asthma, and exposure to smoke may make the condition worse. What are the causes? This condition can be caused by germs and by substances that irritate the lungs, including: Cold and flu viruses. The most common cause of this condition is the virus that causes the common cold. Bacteria. This is less common. Breathing in substances that irritate the lungs, including: Smoke from cigarettes and other forms of tobacco. Dust and pollen. Fumes from household cleaning products, gases, or burned fuel. Indoor or outdoor air  pollution. What increases the risk? The following factors may make you more likely to develop this condition: A weak body's defense system, also called the immune system. A condition that affects your lungs and breathing, such as asthma. What are the signs or symptoms? Common symptoms of this condition include: Coughing. This may bring up clear, yellow, or green mucus from your lungs (sputum). Wheezing. Runny or stuffy nose. Having too much mucus in your lungs (chest congestion). Shortness of breath. Aches and pains, including sore throat or chest. How is this diagnosed? This condition is usually diagnosed based on: Your symptoms and medical history. A physical exam. You may also have other tests, including tests to rule out other conditions, such as pneumonia. These tests include: A test of lung function. Test of a mucus sample to look for the presence of bacteria. Tests to check the oxygen level in your blood. Blood tests. Chest X-ray. How is this treated? Most cases of acute bronchitis clear up over time without treatment. Your health care provider may recommend: Drinking more fluids to help thin your mucus so it is  easier to cough up. Taking inhaled medicine (inhaler) to improve air flow in and out of your lungs. Using a vaporizer or a humidifier. These are machines that add water to the air to help you breathe better. Taking a medicine that thins mucus and clears congestion (expectorant). Taking a medicine that prevents or stops coughing (cough suppressant). It is not common to take an antibiotic medicine for this condition. Follow these instructions at home:  Take over-the-counter and prescription medicines only as told by your health care provider. Use an inhaler, vaporizer, or humidifier as told by your health care provider. Take two teaspoons (10 mL) of honey at bedtime to lessen coughing at night. Drink enough fluid to keep your urine pale yellow. Do not use any  products that contain nicotine or tobacco. These products include cigarettes, chewing tobacco, and vaping devices, such as e-cigarettes. If you need help quitting, ask your health care provider. Get plenty of rest. Return to your normal activities as told by your health care provider. Ask your health care provider what activities are safe for you. Keep all follow-up visits. This is important. How is this prevented? To lower your risk of getting this condition again: Wash your hands often with soap and water for at least 20 seconds. If soap and water are not available, use hand sanitizer. Avoid contact with people who have cold symptoms. Try not to touch your mouth, nose, or eyes with your hands. Avoid breathing in smoke or chemical fumes. Breathing smoke or chemical fumes will make your condition worse. Get the flu shot every year. Contact a health care provider if: Your symptoms do not improve after 2 weeks. You have trouble coughing up the mucus. Your cough keeps you awake at night. You have a fever. Get help right away if you: Cough up blood. Feel pain in your chest. Have severe shortness of breath. Faint or keep feeling like you are going to faint. Have a severe headache. Have a fever or chills that get worse. These symptoms may represent a serious problem that is an emergency. Do not wait to see if the symptoms will go away. Get medical help right away. Call your local emergency services (911 in the [place] Do not drive yourself to the hospital. Summary Acute bronchitis is inflammation of the main airways (bronchi) that come off the windpipe (trachea) in the lungs. The swelling causes the airways to get smaller and make more mucus than normal. Drinking more fluids can help thin your mucus so it is easier to cough up. Take over-the-counter and prescription medicines only as told by your health care provider. Do not use any products that contain nicotine or tobacco. These products  include cigarettes, chewing tobacco, and vaping devices, such as e-cigarettes. If you need help quitting, ask your health care provider. Contact a health care provider if your symptoms do not improve after 2 weeks. This information is not intended to replace advice given to you by your health care provider. Make sure you discuss any questions you have with your health care provider. Document Revised: 11/21/2021 Document Reviewed: 12/12/2020 Elsevier Patient Education  2024 [employer].

## 2023-07-16 ENCOUNTER — Encounter: Payer: Self-pay | Admitting: Physician Assistant

## 2023-07-16 DIAGNOSIS — B081 Molluscum contagiosum: Secondary | ICD-10-CM

## 2023-08-06 DIAGNOSIS — B081 Molluscum contagiosum: Secondary | ICD-10-CM | POA: Diagnosis not present

## 2023-08-27 DIAGNOSIS — B081 Molluscum contagiosum: Secondary | ICD-10-CM | POA: Diagnosis not present

## 2023-09-17 DIAGNOSIS — B081 Molluscum contagiosum: Secondary | ICD-10-CM | POA: Diagnosis not present

## 2023-10-27 ENCOUNTER — Encounter: Payer: Self-pay | Admitting: Physician Assistant

## 2023-10-27 ENCOUNTER — Ambulatory Visit

## 2023-10-27 ENCOUNTER — Ambulatory Visit: Payer: Medicare HMO | Admitting: Physician Assistant

## 2023-10-27 VITALS — BP 130/78 | HR 77 | Ht 71.0 in | Wt 271.8 lb

## 2023-10-27 DIAGNOSIS — M25512 Pain in left shoulder: Secondary | ICD-10-CM | POA: Diagnosis not present

## 2023-10-27 DIAGNOSIS — M25552 Pain in left hip: Secondary | ICD-10-CM

## 2023-10-27 DIAGNOSIS — G894 Chronic pain syndrome: Secondary | ICD-10-CM

## 2023-10-27 DIAGNOSIS — M061 Adult-onset Still's disease: Secondary | ICD-10-CM

## 2023-10-27 DIAGNOSIS — Z6836 Body mass index (BMI) 36.0-36.9, adult: Secondary | ICD-10-CM | POA: Diagnosis not present

## 2023-10-27 DIAGNOSIS — F3342 Major depressive disorder, recurrent, in full remission: Secondary | ICD-10-CM

## 2023-10-27 DIAGNOSIS — E66812 Obesity, class 2: Secondary | ICD-10-CM | POA: Diagnosis not present

## 2023-10-27 MED ORDER — FLUOXETINE HCL 20 MG PO CAPS: 20.0000 mg | ORAL_CAPSULE | Freq: Every day | ORAL | 3 refills | Status: AC

## 2023-10-27 NOTE — Patient Instructions (Addendum)
 Proximal Biceps Tendinitis and Tenosynovitis Rehab Ask your health care provider which exercises are safe for you. Do exercises exactly as told by your provider and adjust them as directed. It is normal to feel mild stretching, pulling, tightness, or discomfort as you do these exercises. Stop right away if you feel sudden pain or your pain gets worse. Do not begin these exercises until told by your provider. Stretching and range-of-motion exercises These exercises warm up your muscles and joints. They can help improve the movement and flexibility of your arm and shoulder. They may also help to relieve pain and stiffness. Forearm rotation, supination  Stand up or sit with your left / right elbow bent in a 90-degree angle (right angle). Turn (rotate) your left / right palm up (supination) until you cannot rotate it anymore. Then, use your other hand to help turn your left / right forearm more. Hold this position for __________ seconds. Slowly return to the starting position. Repeat __________ times. Complete this exercise __________ times a day. Forearm rotation, pronation  Stand up or sit with your left / right elbow bent in a 90-degree angle (right angle). Turn (rotate) your left / right palm down (pronation) until you cannot rotate it anymore. Then, use your other hand to help turn your left / right forearm more. Hold this position for __________ seconds. Slowly return to the starting position. Elbow range of motion  Stand or sit with your left / right elbow bent in a 90-degree angle (right angle). Position your forearm so that the thumb is facing the ceiling (neutral position). Slowly bend your elbow as far as you can until you feel a stretch or cannot go any farther. Hold this position for __________ seconds. Slowly straighten your elbow as far as you can until you feel a stretch or cannot go any farther. Hold this position for __________ seconds. Repeat __________ times. Complete this  exercise __________ times a day. Biceps stretch  Stand facing a wall, or stand by a door frame. Raise your left / right arm out to your side, to your shoulder height. Place the thumb side of your hand against the wall. Your palm should be facing the floor (palm down). Keeping your arm straight, rotate your body in the opposite direction of the raised arm until you feel a gentle stretch in your biceps. Hold this position for __________ seconds. Slowly return to the starting position. Repeat __________ times. Complete this exercise __________ times a day. Shoulder pendulum  Stand near a table or counter that you can hold onto for balance. Bend forward at the waist and let your left / right arm hang straight down. Use your other arm to support you and help you stay balanced. Relax your left / right arm and shoulder muscles. Move your hips and your trunk so your left / right arm swings freely. Your arm should swing because of the motion of your body, not because you are using your arm or shoulder muscles. Keep moving your hips and trunk so your arm swings in the following directions, as told by your provider: Side to side. Forward and backward. In clockwise and counterclockwise circles. Repeat __________ times. Complete this exercise __________ times a day. Shoulder flexion, assisted  Stand facing a wall. Put your left / right palm on the wall. Slowly move your left / right hand up the wall (flexion). Stop when you feel a stretch in your shoulder, or when you reach the angle that is recommended by your provider.  Use your other hand to help raise your arm, if needed (assisted). As your hand gets higher, you may need to step closer to the wall. Avoid shrugging or lifting your shoulder up as you raise your arm. To do this, keep your shoulder blade tucked down toward your spine. Hold this position for __________ seconds. Slowly return to the starting position. Use your other arm to help, if  needed. Repeat __________ times. Complete this exercise __________ times a day. Shoulder flexion  Stand with your left / right arm hanging down at your side. Keep your arm straight as you lift your arm forward and toward the ceiling (flexion). Hold this position for __________ seconds. Slowly return to the starting position. Repeat __________ times. Complete this exercise __________ times a day. Sleeper stretch, assisted  Lie on your left / right side (injured side) with your hips and knees bent and your left / right arm straight in front of you. Bend your elbow to a 90-degree angle (right angle), so your fingers are pointing to the ceiling. Use your other hand to gently push your arm toward the floor (assisted), stopping when you feel a gentle stretch. Keep your shoulder blades lightly squeezed together during the exercise. Hold this position for __________ seconds. Slowly return to the starting position. Repeat __________ times. Complete this exercise __________ times a day. Strengthening exercises These exercises build strength and endurance in your arm and shoulder. Endurance is the ability to use your muscles for a long time, even after they get tired. Biceps curls You can use a weight or an exercise band for this exercise. Sit on a stable chair without armrests, or stand up. Hold a __________ lb / kg weight in your left / right hand, or hold an exercise band with both hands. Your palms should face up toward the ceiling at the starting position. Bend your left / right elbow and move your hand up toward your shoulder. Keep your other arm straight down, in the starting position. Hold this position for __________ seconds. Slowly return to the starting position. Repeat __________ times. Complete this exercise __________ times a day. Internal shoulder rotation You will use an exercise band secured to a stable object at waist height for this exercise. A door and doorframe work  well. Stand sideways next to a door with your left / right arm closest to the door, holding the exercise band in your hand. With your elbow bent in a 90-degree angle (right angle) and keeping your elbow at your side, bring your hand toward your belly (internal rotation). Make sure your wrist is staying straight as you do this exercise. Hold this position for __________ seconds. Slowly return to the starting position. Repeat __________ times. Complete this exercise __________ times a day. External shoulder rotation You will use an exercise band secured to a stable object at waist height for this exercise. A door and doorframe work well. Stand sideways next to a door with your left / right arm away from the door, holding the exercise band in your hand. With your elbow bent in a 90-degree angle (right angle) and keeping your elbow at your side, swing your arm away from your body (external rotation). Make sure your wrist is staying straight as you do this exercise. Hold this position for __________ seconds. Slowly return to the starting position. Repeat __________ times. Complete this exercise __________ times a day. External shoulder rotation, side-lying You will use a weight to do this exercise. Lie on your uninjured  side with your left / right arm at your side. Bend your elbow to a 90-degree angle (right angle). Hold a __________ lb / kg weight in your left / right hand. Keeping your elbow at your side, raise your arm toward the ceiling (external rotation). Make sure your wrist is staying straight as you do this exercise. Hold this position for __________ seconds. Slowly return to the starting position. Repeat __________ times. Complete this exercise __________ times a day. Scapular retraction Scapular retraction is the process of pulling the shoulder blades (scapulae) toward each other, and toward the spine. You will need an exercise band to do this exercise. Sit in a stable chair without  armrests, or stand up. Secure an exercise band to a stable object in front of you so the band is at shoulder height. Hold one end of the exercise band in each hand. Squeeze your shoulder blades together and move your elbows slightly behind you (retraction). Do not shrug your shoulders upward while you do this. Hold this position for __________ seconds. Slowly return to the starting position. Repeat __________ times. Complete this exercise __________ times a day. Scapular protraction, supine Scapular protraction is the process of moving your shoulder blades away from each other, and away from the spine, while you lie on your back (supine position). Lie on your back on a firm surface. Hold a __________ lb / kg weight in your left / right hand. Raise your left / right arm straight into the air so your hand is directly above your shoulder joint. Push the weight into the air so your shoulder (scapula) lifts off the surface that you are lying on. Think of trying to punch the ceiling by only moving your scapula forward (protraction). Do not move your head, neck, or back. Hold this position for __________ seconds. Slowly return to the starting position. Repeat __________ times. Complete this exercise __________ times a day. This information is not intended to replace advice given to you by your health care provider. Make sure you discuss any questions you have with your health care provider. Document Revised: 03/14/2022 Document Reviewed: 03/14/2022 Elsevier Patient Education  2024 [employer]  Hip Bursitis Rehab Ask your health care provider which exercises are safe for you. Do exercises exactly as told by your health care provider and adjust them as directed. It is normal to feel mild stretching, pulling, tightness, or discomfort as you do these exercises. Stop right away if you feel sudden pain or your pain gets worse. Do not begin these exercises until told by your health care  provider. Stretching exercise This exercise warms up your muscles and joints and improves the movement and flexibility of your hip. This exercise also helps to relieve pain and stiffness. Iliotibial band stretch An iliotibial band is a strong band of muscle tissue that runs from the outer side of your hip to the outer side of your thigh and knee. Lie on your side with your left / right leg in the top position. Bend your left / right knee and grab your ankle. Stretch out your bottom arm to help you balance. Slowly bring your knee back so your thigh is slightly behind your body. Slowly lower your knee toward the floor until you feel a gentle stretch on the outside of your left / right thigh. If you do not feel a stretch and your knee will not lower more toward the floor, place the heel of your other foot on top of your knee and pull  your knee down toward the floor with your foot. Hold this position for __________ seconds. Slowly return to the starting position. Repeat __________ times. Complete this exercise __________ times a day. Strengthening exercises These exercises build strength and endurance in your hip and pelvis. Endurance is the ability to use your muscles for a long time, even after they get tired. Bridge This exercise strengthens the muscles that move your thigh backward (hip extensors). Lie on your back on a firm surface with your knees bent and your feet flat on the floor. Tighten your buttocks muscles and lift your buttocks off the floor until your trunk is level with your thighs. Do not arch your back. You should feel the muscles working in your buttocks and the back of your thighs. If you do not feel these muscles, slide your feet 1-2 inches (2.5-5 cm) farther away from your buttocks. If this exercise is too easy, try doing it with your arms crossed over your chest. Hold this position for __________ seconds. Slowly lower your hips to the starting position. Let your muscles  relax completely after each repetition. Repeat __________ times. Complete this exercise __________ times a day. Squats This exercise strengthens the muscles in front of your thigh and knee (quadriceps). Stand in front of a table, with your feet and knees pointing straight ahead. You may rest your hands on the table for balance but not for support. Slowly bend your knees and lower your hips like you are going to sit in a chair. Keep your weight over your heels, not over your toes. Keep your lower legs upright so they are parallel with the table legs. Do not let your hips go lower than your knees. Do not bend lower than told by your health care provider. If your hip pain increases, do not bend as low. Hold the squat position for __________ seconds. Slowly push with your legs to return to standing. Do not use your hands to pull yourself to standing. Repeat __________ times. Complete this exercise __________ times a day. Hip hike  Stand sideways on a bottom step. Stand on your left / right leg with your other foot unsupported next to the step. You can hold on to the railing or wall for balance if needed. Keep your knees straight and your torso square. Then lift your left / right hip up toward the ceiling. Hold this position for __________ seconds. Slowly let your left / right hip lower toward the floor, past the starting position. Your foot should get closer to the floor. Do not lean or bend your knees. Repeat __________ times. Complete this exercise __________ times a day. Single leg stand This exercise increases your balance. Without shoes, stand near a railing or in a doorway. You may hold on to the railing or door frame as needed for balance. Squeeze your left / right buttock muscles, then lift up your other foot. Do not let your left / right hip push out to the side. It is helpful to stand in front of a mirror for this exercise so you can watch your hip. Hold this position for __________  seconds. Repeat __________ times. Complete this exercise __________ times a day. This information is not intended to replace advice given to you by your health care provider. Make sure you discuss any questions you have with your health care provider. Document Revised: 07/24/2021 Document Reviewed: 07/24/2021 Elsevier Patient Education  2024 [employer].
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
## 2023-11-02 ENCOUNTER — Encounter: Payer: Self-pay | Admitting: Physician Assistant

## 2023-11-02 DIAGNOSIS — F3342 Major depressive disorder, recurrent, in full remission: Secondary | ICD-10-CM | POA: Insufficient documentation

## 2023-11-13 ENCOUNTER — Encounter: Payer: Self-pay | Admitting: Physician Assistant

## 2023-11-13 NOTE — Progress Notes (Signed)
 No acute findings of left shoulder. Will need MRI if still causing issues.

## 2023-11-13 NOTE — Progress Notes (Signed)
 No significant arthritis or acute findings on hip xray.

## 2023-12-25 ENCOUNTER — Other Ambulatory Visit: Payer: Self-pay | Admitting: Physician Assistant

## 2023-12-25 DIAGNOSIS — M130 Polyarthritis, unspecified: Secondary | ICD-10-CM

## 2023-12-25 DIAGNOSIS — G894 Chronic pain syndrome: Secondary | ICD-10-CM

## 2023-12-25 DIAGNOSIS — M061 Adult-onset Still's disease: Secondary | ICD-10-CM

## 2024-01-04 ENCOUNTER — Other Ambulatory Visit: Payer: Self-pay | Admitting: Physician Assistant

## 2024-01-04 DIAGNOSIS — M061 Adult-onset Still's disease: Secondary | ICD-10-CM

## 2024-01-04 NOTE — Telephone Encounter (Signed)
 Copied from CRM [id number]. Topic: Clinical - Medication Refill >> Jan 04, 2024  3:47 PM Suzette B wrote: Medication: Canakinumab  (ILARIS ) 150 MG/ML SOLN  Has the patient contacted their pharmacy? No he's said he trying to keep the refills going due to his rheumologist retiring  This is the patient's preferred pharmacy:  [id number] [place], [place] - [address] [place] [address] Phone: [phone] Fax: [phone]  Is this the correct pharmacy for this prescription? Yes If no, delete pharmacy and type the correct one.   Has the prescription been filled recently? Yes  Is the patient out of the medication? No  Has the patient been seen for an appointment in the last year OR does the patient have an upcoming appointment? Yes  Can we respond through MyChart? Yes  Agent: Please be advised that Rx refills may take up to 3 business days. We ask that you follow-up with your pharmacy.

## 2024-01-04 NOTE — Telephone Encounter (Signed)
 Requesting rx rf of canakinumab  150mg /ml  Message from patient-Has the patient contacted their pharmacy? No he's said he trying to keep the refills going due to his rheumologist retiring  Last written 07/03/2021 Last OV 10/25/2023 Upcoming appt = none

## 2024-01-05 MED ORDER — ILARIS 150 MG/ML ~~LOC~~ SOLN: 300.0000 mg | SUBCUTANEOUS | 0 refills | Status: DC

## 2024-01-07 ENCOUNTER — Telehealth: Payer: Self-pay

## 2024-01-07 NOTE — Telephone Encounter (Signed)
 Copied from CRM [id number]. Topic: Clinical - Prescription Issue >> Jan 07, 2024  1:46 PM Kevelyn M wrote: Reason for CRM: Lorelee Roger with [facility] called because the pharmacist flagged the Ilaris  prescription due to frequency changes not documented. Pharmacist is requesting clarity on dosage frequency. Phone # [phone]/Fax#[phone].

## 2024-01-08 NOTE — Telephone Encounter (Signed)
 Spoke with patient - states he is taking one injection ( 2 vials to total 300mg  ) every 21 days .  He states the earliest appt he could get with Rheumatologist was for October 13th Dr. Marc Senior.

## 2024-01-08 NOTE — Telephone Encounter (Signed)
 I just refilled to bridge patient until he can get another rheumatologist. Can we call patient and confirm how he is taking?

## 2024-01-11 ENCOUNTER — Other Ambulatory Visit: Payer: Self-pay

## 2024-01-11 DIAGNOSIS — M061 Adult-onset Still's disease: Secondary | ICD-10-CM

## 2024-01-11 MED ORDER — ILARIS 150 MG/ML ~~LOC~~ SOLN: SUBCUTANEOUS | 0 refills | Status: AC

## 2024-01-11 NOTE — Progress Notes (Signed)
 Sent new rx

## 2024-04-26 ENCOUNTER — Encounter: Payer: Self-pay | Admitting: Sports Medicine

## 2024-05-10 ENCOUNTER — Encounter: Payer: Self-pay | Admitting: Physician Assistant

## 2024-05-23 ENCOUNTER — Encounter: Payer: Self-pay | Admitting: Physician Assistant

## 2024-05-23 ENCOUNTER — Ambulatory Visit (INDEPENDENT_AMBULATORY_CARE_PROVIDER_SITE_OTHER): Admitting: Physician Assistant

## 2024-05-23 VITALS — BP 115/65 | HR 65 | Ht 71.0 in

## 2024-05-23 DIAGNOSIS — M061 Adult-onset Still's disease: Secondary | ICD-10-CM | POA: Diagnosis not present

## 2024-05-23 DIAGNOSIS — M25561 Pain in right knee: Secondary | ICD-10-CM | POA: Diagnosis not present

## 2024-05-23 DIAGNOSIS — M1711 Unilateral primary osteoarthritis, right knee: Secondary | ICD-10-CM | POA: Diagnosis not present

## 2024-05-23 DIAGNOSIS — Z131 Encounter for screening for diabetes mellitus: Secondary | ICD-10-CM

## 2024-05-23 DIAGNOSIS — Z13220 Encounter for screening for lipoid disorders: Secondary | ICD-10-CM | POA: Diagnosis not present

## 2024-05-23 DIAGNOSIS — Z79899 Other long term (current) drug therapy: Secondary | ICD-10-CM | POA: Diagnosis not present

## 2024-05-23 DIAGNOSIS — R5383 Other fatigue: Secondary | ICD-10-CM

## 2024-05-23 DIAGNOSIS — K219 Gastro-esophageal reflux disease without esophagitis: Secondary | ICD-10-CM

## 2024-05-23 DIAGNOSIS — Z23 Encounter for immunization: Secondary | ICD-10-CM | POA: Diagnosis not present

## 2024-05-23 MED ORDER — OMEPRAZOLE 40 MG PO CPDR: 40.0000 mg | DELAYED_RELEASE_CAPSULE | Freq: Every day | ORAL | 4 refills | Status: AC

## 2024-05-23 NOTE — Patient Instructions (Signed)
 Start exercises for knee. Wear patellar straps.  If not improving will make referral to Dr. Boyd, sports medicine.   Patellofemoral Pain Syndrome  Patellofemoral pain syndrome or PFPS is a condition that causes pain in front of the knee and around the kneecap. The kneecap is also called the patella. Your kneecap covers the front of the knee and is attached to muscles above and below the knee. PFPS can be caused by many things. PFPS is most common in active young adults. What are the causes? PFPS may be caused by: Using your knee too much. The smooth tissue that is called cartilage on the underside of the kneecap breaking down. This is called chondromalacia patella or runner's knee. Your knee joint not being aligned well. Weak leg muscles. A hit to your kneecap. What increases the risk? You are more likely to develop PFPS if: You do a lot of activities over and over again that can wear down your kneecap. These include: Running. Squatting. Climbing stairs. You wear shoes that do not fit well. You do not have a lot of leg strength. You are overweight. What are the signs or symptoms? The main symptom of PFPS is knee pain. This may feel like a dull pain under your kneecap. There may be a popping or cracking sound when you move your knee. Pain may get worse when you: Exercise. Climb stairs. Run. Jump. Squat. Kneel. Sit for a long time. Move or push on your kneecap. How is this diagnosed? PFPS may be diagnosed based on: Your symptoms and medical history. You may be asked about your activities and which ones cause knee pain. A physical exam. This may include: Moving your kneecap back and forth. Checking how you move your knee. Having you squat or jump to see if you have pain. Checking the strength of your leg muscles. Imaging tests. These may include an MRI of your knee. How is this treated? PFPS may be treated at home with rest, ice, pressure (compression), and elevation. This  is called RICE therapy. Other treatments may include: NSAIDs, such as ibuprofen . Doing physical therapy exercises to improve movement and strength in your leg. Shoe inserts to take stress off your knee. Sports tape to support the kneecap. Surgery to remove damaged tissue or move the kneecap to a better position. This is rare. Follow these instructions at home: Managing pain, stiffness, and swelling  If told, put ice on the area. Put ice in a plastic bag. Place a towel between your skin and the bag. Leave the ice on for 20 minutes, 2-3 times a day. If your skin turns bright red, take off the ice right away to prevent skin damage. The risk of damage is higher if you can't feel pain, heat, or cold. Move your toes often to reduce stiffness and swelling. Raisethe injured area above the level of your heart while you are sitting or lying down. Activity Rest your knee as told by your health care provider. Avoid activities that cause knee pain. Do stretching and strengthening exercises as told by your provider or physical therapist. Return to your normal activities as told by your provider. Ask your provider what activities are safe for you. General instructions Take over-the-counter and prescription medicines only as told by your provider. Use shoe inserts as told. Put sports tape on your knee as told. Do not use any products that contain nicotine or tobacco. These products include cigarettes, chewing tobacco, and vaping devices, such as e-cigarettes. If you need help quitting,  ask your provider. Keep all follow-up visits. Your provider will watch your pain and try other treatments if needed. Contact a health care provider if: Your symptoms get worse. Your knee pain does not get better with home care. This information is not intended to replace advice given to you by your health care provider. Make sure you discuss any questions you have with your health care provider. Document Revised:  10/09/2022 Document Reviewed: 10/09/2022 Elsevier Patient Education  2024 [employer].

## 2024-05-23 NOTE — Progress Notes (Signed)
 Established Patient Office Visit  Subjective   Patient ID: Raymond Owens, male    DOB: 04-28-1991  Age: 33 y.o. MRN: [id number]  Chief Complaint  Patient presents with   Medical Management of Chronic Issues    HPI Discussed the use of AI scribe software for clinical note transcription with the patient, who gave verbal consent to proceed.  History of Present Illness Raymond Owens is a 33 year old male who presents with bilateral knee pain and low energy.  Bilateral knee pain - Bilateral knee pain, initially starting in the left knee and now affecting both knees - Sensation of knees 'going backwards' or hyperextending, particularly after sitting for long periods - Pain present while sitting and when standing up after prolonged sitting - No pain with walking or being active - No history of falls related to knee symptoms - Only the left knee has been previously x-rayed - No prior knee injections  Fatigue and low motivation - Low energy and motivation - Not currently taking prednisone  - Currently taking Loras and methotrexate  - Mood is stable - No thoughts of self-harm  Gastroesophageal reflux symptoms - History of reflux - Takes omeprazole  for reflux symptoms - Improvement in symptoms with reduction of spicy food intake  Blood pressure symptoms - No dizziness despite low blood pressure readings - Blood pressure improved upon rechecking    ROS See HPI.    Objective:     BP 115/65 (BP Location: Left Arm, Patient Position: Sitting)   Pulse 65   Ht 5' 11 (1.803 m)   SpO2 99%   BMI 37.90 kg/m  BP Readings from Last 3 Encounters:  05/23/24 115/65  10/27/23 130/78  06/23/23 110/67   Wt Readings from Last 3 Encounters:  10/27/23 271 lb 12 oz (123.3 kg)  06/23/23 260 lb (117.9 kg)  05/15/23 260 lb 12 oz (118.3 kg)      Physical Exam Constitutional:      Appearance: Normal appearance.  HENT:     Head: Normocephalic.  Cardiovascular:     Rate and  Rhythm: Normal rate and regular rhythm.  Pulmonary:     Effort: Pulmonary effort is normal.     Breath sounds: Normal breath sounds.  Musculoskeletal:        General: Normal range of motion.     Right lower leg: No edema.     Left lower leg: No edema.     Comments: No swelling, redness, warmth of bilateral knees.   Neurological:     General: No focal deficit present.     Mental Status: He is alert and oriented to person, place, and time.  Psychiatric:        Mood and Affect: Mood normal.        Assessment & Plan:  Raymond Owens was seen today for medical management of chronic issues.  Diagnoses and all orders for this visit:  Immunization due -     Flu vaccine trivalent PF, 6mos and older(Flulaval,Afluria,Fluarix,Fluzone)  Adult-onset Still's disease (HCC)  Gastroesophageal reflux disease without esophagitis -     omeprazole  (PRILOSEC) 40 MG capsule; Take 1 capsule (40 mg total) by mouth daily.  Acute pain of right knee  Primary osteoarthritis of right knee  Low energy -     Lipid panel -     CMP14+EGFR -     Sed Rate (ESR) -     C-reactive protein -     CBC w/Diff/Platelet -     TSH +  free T4 -     VITAMIN D  25 Hydroxy (Vit-D Deficiency, Fractures) -     B12 and Folate Panel -     Testosterone   Medication management -     Lipid panel -     CMP14+EGFR -     Sed Rate (ESR) -     C-reactive protein -     CBC w/Diff/Platelet -     TSH + free T4 -     VITAMIN D  25 Hydroxy (Vit-D Deficiency, Fractures) -     B12 and Folate Panel -     Testosterone   Screening for lipid disorders -     Lipid panel  Screening for diabetes mellitus -     CMP14+EGFR    Assessment & Plan 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 - Recommend patella straps for support. - Provide leg muscle strengthening exercises. - Consider anti-inflammatory medication  naproxen .   Adult-onset Still's disease Follow-up with rheumatology scheduled. Currently on Ilaris  and methotrexate . - Order labs and send results to rheumatology.  Gastroesophageal reflux disease (GERD) Symptoms managed with omeprazole  and dietary changes. - Refill omeprazole  prescription.  Low blood pressure Low blood pressure without symptoms, improved on recheck. - Advise increased fluid intake and more salt consumption.  Fatigue and low motivation, under evaluation Persistent low energy and motivation despite previous evaluations. - Order testosterone  and thyroid  function tests.     Return in about 6 months (around 11/20/2024).    Jaidin Richison, PA-C

## 2024-05-24 ENCOUNTER — Ambulatory Visit: Payer: Self-pay | Admitting: Physician Assistant

## 2024-05-24 ENCOUNTER — Encounter: Payer: Self-pay | Admitting: Physician Assistant

## 2024-05-24 LAB — CMP14+EGFR
ALT: 120 IU/L — ABNORMAL HIGH (ref 0–44)
AST: 62 IU/L — ABNORMAL HIGH (ref 0–40)
Albumin: 4.6 g/dL (ref 4.1–5.1)
Alkaline Phosphatase: 68 IU/L (ref 47–123)
BUN/Creatinine Ratio: 13 (ref 9–20)
BUN: 14 mg/dL (ref 6–20)
Bilirubin Total: 0.7 mg/dL (ref 0.0–1.2)
CO2: 21 mmol/L (ref 20–29)
Calcium: 9.4 mg/dL (ref 8.7–10.2)
Chloride: 103 mmol/L (ref 96–106)
Creatinine, Ser: 1.11 mg/dL (ref 0.76–1.27)
Globulin, Total: 2.5 g/dL (ref 1.5–4.5)
Glucose: 91 mg/dL (ref 70–99)
Potassium: 4 mmol/L (ref 3.5–5.2)
Sodium: 139 mmol/L (ref 134–144)
Total Protein: 7.1 g/dL (ref 6.0–8.5)
eGFR: 90 mL/min/1.73 (ref >59)

## 2024-05-24 LAB — CBC WITH DIFFERENTIAL/PLATELET
Basophils Absolute: 0.1 x10E3/uL (ref 0.0–0.2)
Basos: 1 %
EOS (ABSOLUTE): 0.2 x10E3/uL (ref 0.0–0.4)
Eos: 3 %
Hematocrit: 49.4 % (ref 37.5–51.0)
Hemoglobin: 16.1 g/dL (ref 13.0–17.7)
Immature Grans (Abs): 0 x10E3/uL (ref 0.0–0.1)
Immature Granulocytes: 0 %
Lymphocytes Absolute: 2.6 x10E3/uL (ref 0.7–3.1)
Lymphs: 41 %
MCH: 31.2 pg (ref 26.6–33.0)
MCHC: 32.6 g/dL (ref 31.5–35.7)
MCV: 96 fL (ref 79–97)
Monocytes Absolute: 0.6 x10E3/uL (ref 0.1–0.9)
Monocytes: 9 %
Neutrophils Absolute: 3 x10E3/uL (ref 1.4–7.0)
Neutrophils: 46 %
Platelets: 277 x10E3/uL (ref 150–450)
RBC: 5.16 x10E6/uL (ref 4.14–5.80)
RDW: 13.1 % (ref 11.6–15.4)
WBC: 6.4 x10E3/uL (ref 3.4–10.8)

## 2024-05-24 LAB — TSH+FREE T4
Free T4: 0.96 ng/dL (ref 0.82–1.77)
TSH: 1.58 u[IU]/mL (ref 0.450–4.500)

## 2024-05-24 LAB — LIPID PANEL
Chol/HDL Ratio: 5.3 ratio — ABNORMAL HIGH (ref 0.0–5.0)
Cholesterol, Total: 203 mg/dL — ABNORMAL HIGH (ref 100–199)
HDL: 38 mg/dL — ABNORMAL LOW (ref >39)
LDL Chol Calc (NIH): 134 mg/dL — ABNORMAL HIGH (ref 0–99)
Triglycerides: 170 mg/dL — ABNORMAL HIGH (ref 0–149)
VLDL Cholesterol Cal: 31 mg/dL (ref 5–40)

## 2024-05-24 LAB — B12 AND FOLATE PANEL
Folate: 11.3 ng/mL (ref >3.0)
Vitamin B-12: 700 pg/mL (ref 232–1245)

## 2024-05-24 LAB — TESTOSTERONE: Testosterone: 740 ng/dL (ref 264–916)

## 2024-05-24 LAB — VITAMIN D 25 HYDROXY (VIT D DEFICIENCY, FRACTURES): Vit D, 25-Hydroxy: 44 ng/mL (ref 30.0–100.0)

## 2024-05-24 LAB — SEDIMENTATION RATE: Sed Rate: 2 mm/h (ref 0–15)

## 2024-05-24 LAB — C-REACTIVE PROTEIN: CRP: <1 mg/L (ref 0–10)

## 2024-05-24 NOTE — Progress Notes (Signed)
 Raymond Owens,   Testosterone  looks good.  Vitamin b12 and folate look great.  Vitamin D  normal.  Inflammatory markers normal.  Normal thyroid  and hemoglobin.  Liver enzymes are up. Are you drinking alcohol at all? How much tylenol  are you taking, if any?   TG improved a lot. Very close to goal.  LDL not to goal and worsened in the last year.  Avoid fried/fatty/processed foods

## 2024-06-06 DIAGNOSIS — R7989 Other specified abnormal findings of blood chemistry: Secondary | ICD-10-CM | POA: Diagnosis not present

## 2024-06-06 DIAGNOSIS — Z79899 Other long term (current) drug therapy: Secondary | ICD-10-CM | POA: Diagnosis not present

## 2024-06-06 DIAGNOSIS — M0820 Juvenile rheumatoid arthritis with systemic onset, unspecified site: Secondary | ICD-10-CM | POA: Diagnosis not present

## 2024-06-30 DIAGNOSIS — K7689 Other specified diseases of liver: Secondary | ICD-10-CM | POA: Diagnosis not present

## 2024-06-30 DIAGNOSIS — R7989 Other specified abnormal findings of blood chemistry: Secondary | ICD-10-CM | POA: Diagnosis not present

## 2024-11-21 ENCOUNTER — Ambulatory Visit: Admitting: Physician Assistant
